# Patient Record
Sex: Female | Born: 1975 | Race: White | Hispanic: No | Smoking: Former smoker
Health system: Southern US, Community
[De-identification: ages and names within clinical notes are randomized; demographics above are authoritative.]

## PROBLEM LIST (undated history)

## (undated) DIAGNOSIS — T8859XA Other complications of anesthesia, initial encounter: Secondary | ICD-10-CM

## (undated) DIAGNOSIS — G4700 Insomnia, unspecified: Secondary | ICD-10-CM

## (undated) DIAGNOSIS — F329 Major depressive disorder, single episode, unspecified: Secondary | ICD-10-CM

## (undated) DIAGNOSIS — R112 Nausea with vomiting, unspecified: Secondary | ICD-10-CM

## (undated) DIAGNOSIS — F988 Other specified behavioral and emotional disorders with onset usually occurring in childhood and adolescence: Secondary | ICD-10-CM

## (undated) DIAGNOSIS — F32A Depression, unspecified: Secondary | ICD-10-CM

## (undated) DIAGNOSIS — F419 Anxiety disorder, unspecified: Secondary | ICD-10-CM

## (undated) DIAGNOSIS — R51 Headache: Secondary | ICD-10-CM

## (undated) DIAGNOSIS — R519 Headache, unspecified: Secondary | ICD-10-CM

## (undated) DIAGNOSIS — Z8709 Personal history of other diseases of the respiratory system: Secondary | ICD-10-CM

## (undated) DIAGNOSIS — I10 Essential (primary) hypertension: Secondary | ICD-10-CM

## (undated) DIAGNOSIS — Z8489 Family history of other specified conditions: Secondary | ICD-10-CM

## (undated) DIAGNOSIS — Z9889 Other specified postprocedural states: Secondary | ICD-10-CM

## (undated) DIAGNOSIS — N809 Endometriosis, unspecified: Secondary | ICD-10-CM

## (undated) DIAGNOSIS — Z862 Personal history of diseases of the blood and blood-forming organs and certain disorders involving the immune mechanism: Secondary | ICD-10-CM

## (undated) DIAGNOSIS — M1990 Unspecified osteoarthritis, unspecified site: Secondary | ICD-10-CM

## (undated) DIAGNOSIS — Z87442 Personal history of urinary calculi: Secondary | ICD-10-CM

## (undated) DIAGNOSIS — T4145XA Adverse effect of unspecified anesthetic, initial encounter: Secondary | ICD-10-CM

## (undated) HISTORY — DX: Endometriosis, unspecified: N80.9

## (undated) HISTORY — PX: WRIST SURGERY: SHX841

## (undated) HISTORY — PX: TONSILLECTOMY: SUR1361

## (undated) HISTORY — PX: ULNAR NERVE REPAIR: SHX2594

## (undated) HISTORY — PX: APPENDECTOMY: SHX54

## (undated) HISTORY — PX: LAPAROSCOPIC ENDOMETRIOSIS FULGURATION: SUR769

## (undated) HISTORY — PX: KNEE SURGERY: SHX244

---

## 1997-12-22 ENCOUNTER — Ambulatory Visit (HOSPITAL_COMMUNITY): Admission: RE | Admit: 1997-12-22 | Discharge: 1997-12-22 | Payer: Self-pay

## 1998-08-30 ENCOUNTER — Ambulatory Visit (HOSPITAL_COMMUNITY): Admission: RE | Admit: 1998-08-30 | Discharge: 1998-08-30 | Payer: Self-pay

## 1999-04-24 ENCOUNTER — Emergency Department (HOSPITAL_COMMUNITY): Admission: EM | Admit: 1999-04-24 | Discharge: 1999-04-24 | Payer: Self-pay | Admitting: Emergency Medicine

## 1999-04-24 ENCOUNTER — Encounter: Payer: Self-pay | Admitting: Family Medicine

## 1999-05-09 ENCOUNTER — Ambulatory Visit (HOSPITAL_COMMUNITY): Admission: RE | Admit: 1999-05-09 | Discharge: 1999-05-09 | Payer: Self-pay | Admitting: Family Medicine

## 1999-05-09 ENCOUNTER — Encounter: Payer: Self-pay | Admitting: Family Medicine

## 1999-06-28 ENCOUNTER — Encounter: Payer: Self-pay | Admitting: Family Medicine

## 1999-06-28 ENCOUNTER — Ambulatory Visit (HOSPITAL_COMMUNITY): Admission: RE | Admit: 1999-06-28 | Discharge: 1999-06-28 | Payer: Self-pay | Admitting: Family Medicine

## 1999-06-29 ENCOUNTER — Other Ambulatory Visit: Admission: RE | Admit: 1999-06-29 | Discharge: 1999-06-29 | Payer: Self-pay | Admitting: Obstetrics and Gynecology

## 1999-09-07 ENCOUNTER — Encounter (INDEPENDENT_AMBULATORY_CARE_PROVIDER_SITE_OTHER): Payer: Self-pay | Admitting: Specialist

## 1999-09-07 ENCOUNTER — Ambulatory Visit (HOSPITAL_COMMUNITY): Admission: RE | Admit: 1999-09-07 | Discharge: 1999-09-07 | Payer: Self-pay | Admitting: Gastroenterology

## 2000-07-26 ENCOUNTER — Ambulatory Visit (HOSPITAL_COMMUNITY): Admission: RE | Admit: 2000-07-26 | Discharge: 2000-07-26 | Payer: Self-pay | Admitting: Family Medicine

## 2000-07-26 ENCOUNTER — Encounter: Payer: Self-pay | Admitting: Family Medicine

## 2000-08-17 ENCOUNTER — Other Ambulatory Visit: Admission: RE | Admit: 2000-08-17 | Discharge: 2000-08-17 | Payer: Self-pay | Admitting: Obstetrics & Gynecology

## 2000-09-20 ENCOUNTER — Ambulatory Visit (HOSPITAL_COMMUNITY): Admission: RE | Admit: 2000-09-20 | Discharge: 2000-09-20 | Payer: Self-pay | Admitting: *Deleted

## 2001-10-20 ENCOUNTER — Encounter: Payer: Self-pay | Admitting: Emergency Medicine

## 2001-10-20 ENCOUNTER — Encounter (INDEPENDENT_AMBULATORY_CARE_PROVIDER_SITE_OTHER): Payer: Self-pay

## 2001-10-20 ENCOUNTER — Inpatient Hospital Stay (HOSPITAL_COMMUNITY): Admission: EM | Admit: 2001-10-20 | Discharge: 2001-10-22 | Payer: Self-pay

## 2002-03-06 ENCOUNTER — Encounter: Payer: Self-pay | Admitting: *Deleted

## 2002-03-06 ENCOUNTER — Encounter: Admission: RE | Admit: 2002-03-06 | Discharge: 2002-03-06 | Payer: Self-pay | Admitting: *Deleted

## 2002-03-21 ENCOUNTER — Other Ambulatory Visit: Admission: RE | Admit: 2002-03-21 | Discharge: 2002-03-21 | Payer: Self-pay | Admitting: *Deleted

## 2002-05-16 ENCOUNTER — Ambulatory Visit (HOSPITAL_COMMUNITY): Admission: RE | Admit: 2002-05-16 | Discharge: 2002-05-16 | Payer: Self-pay | Admitting: *Deleted

## 2002-08-08 ENCOUNTER — Ambulatory Visit (HOSPITAL_COMMUNITY): Admission: RE | Admit: 2002-08-08 | Discharge: 2002-08-08 | Payer: Self-pay | Admitting: Family Medicine

## 2002-08-08 ENCOUNTER — Encounter: Payer: Self-pay | Admitting: Family Medicine

## 2003-07-13 ENCOUNTER — Other Ambulatory Visit: Admission: RE | Admit: 2003-07-13 | Discharge: 2003-07-13 | Payer: Self-pay | Admitting: *Deleted

## 2003-10-15 ENCOUNTER — Ambulatory Visit (HOSPITAL_COMMUNITY): Admission: RE | Admit: 2003-10-15 | Discharge: 2003-10-15 | Payer: Self-pay | Admitting: Otolaryngology

## 2003-12-03 ENCOUNTER — Emergency Department (HOSPITAL_COMMUNITY): Admission: EM | Admit: 2003-12-03 | Discharge: 2003-12-03 | Payer: Self-pay | Admitting: Emergency Medicine

## 2003-12-08 ENCOUNTER — Encounter: Admission: RE | Admit: 2003-12-08 | Discharge: 2003-12-08 | Payer: Self-pay | Admitting: *Deleted

## 2003-12-31 ENCOUNTER — Ambulatory Visit (HOSPITAL_COMMUNITY): Admission: RE | Admit: 2003-12-31 | Discharge: 2003-12-31 | Payer: Self-pay | Admitting: *Deleted

## 2004-11-07 ENCOUNTER — Encounter: Admission: RE | Admit: 2004-11-07 | Discharge: 2004-11-07 | Payer: Self-pay | Admitting: Orthopedic Surgery

## 2004-11-10 ENCOUNTER — Encounter: Admission: RE | Admit: 2004-11-10 | Discharge: 2004-11-10 | Payer: Self-pay | Admitting: Orthopedic Surgery

## 2005-01-12 ENCOUNTER — Encounter: Admission: RE | Admit: 2005-01-12 | Discharge: 2005-01-12 | Payer: Self-pay | Admitting: General Surgery

## 2005-01-25 ENCOUNTER — Encounter: Admission: RE | Admit: 2005-01-25 | Discharge: 2005-01-25 | Payer: Self-pay | Admitting: General Surgery

## 2005-06-02 ENCOUNTER — Encounter
Admission: RE | Admit: 2005-06-02 | Discharge: 2005-08-31 | Payer: Self-pay | Admitting: Physical Medicine & Rehabilitation

## 2005-06-02 ENCOUNTER — Ambulatory Visit: Payer: Self-pay | Admitting: Physical Medicine & Rehabilitation

## 2006-03-28 ENCOUNTER — Inpatient Hospital Stay (HOSPITAL_COMMUNITY): Admission: AD | Admit: 2006-03-28 | Discharge: 2006-03-28 | Payer: Self-pay | Admitting: Obstetrics and Gynecology

## 2006-04-01 ENCOUNTER — Inpatient Hospital Stay (HOSPITAL_COMMUNITY): Admission: AD | Admit: 2006-04-01 | Discharge: 2006-04-01 | Payer: Self-pay | Admitting: Obstetrics & Gynecology

## 2006-04-12 ENCOUNTER — Inpatient Hospital Stay (HOSPITAL_COMMUNITY): Admission: AD | Admit: 2006-04-12 | Discharge: 2006-04-15 | Payer: Self-pay | Admitting: *Deleted

## 2006-11-05 ENCOUNTER — Encounter
Admission: RE | Admit: 2006-11-05 | Discharge: 2007-02-03 | Payer: Self-pay | Admitting: Physical Medicine & Rehabilitation

## 2006-11-06 ENCOUNTER — Ambulatory Visit: Payer: Self-pay | Admitting: Physical Medicine & Rehabilitation

## 2006-12-18 ENCOUNTER — Ambulatory Visit: Payer: Self-pay | Admitting: Physical Medicine & Rehabilitation

## 2007-01-22 ENCOUNTER — Ambulatory Visit: Payer: Self-pay | Admitting: Physical Medicine & Rehabilitation

## 2007-02-07 ENCOUNTER — Encounter
Admission: RE | Admit: 2007-02-07 | Discharge: 2007-05-08 | Payer: Self-pay | Admitting: Physical Medicine & Rehabilitation

## 2007-04-08 ENCOUNTER — Ambulatory Visit: Payer: Self-pay | Admitting: Physical Medicine & Rehabilitation

## 2007-05-06 ENCOUNTER — Encounter
Admission: RE | Admit: 2007-05-06 | Discharge: 2007-08-04 | Payer: Self-pay | Admitting: Physical Medicine & Rehabilitation

## 2007-06-06 ENCOUNTER — Encounter
Admission: RE | Admit: 2007-06-06 | Discharge: 2007-08-02 | Payer: Self-pay | Admitting: Physical Medicine & Rehabilitation

## 2007-06-14 ENCOUNTER — Ambulatory Visit: Payer: Self-pay | Admitting: Physical Medicine & Rehabilitation

## 2007-06-21 ENCOUNTER — Encounter
Admission: RE | Admit: 2007-06-21 | Discharge: 2007-06-21 | Payer: Self-pay | Admitting: Physical Medicine & Rehabilitation

## 2007-07-25 ENCOUNTER — Ambulatory Visit: Payer: Self-pay | Admitting: Physical Medicine & Rehabilitation

## 2007-08-21 ENCOUNTER — Encounter
Admission: RE | Admit: 2007-08-21 | Discharge: 2007-11-19 | Payer: Self-pay | Admitting: Physical Medicine & Rehabilitation

## 2007-09-06 ENCOUNTER — Ambulatory Visit: Payer: Self-pay | Admitting: Physical Medicine & Rehabilitation

## 2007-10-15 ENCOUNTER — Ambulatory Visit: Payer: Self-pay | Admitting: Physical Medicine & Rehabilitation

## 2007-10-20 ENCOUNTER — Ambulatory Visit (HOSPITAL_COMMUNITY)
Admission: RE | Admit: 2007-10-20 | Discharge: 2007-10-20 | Payer: Self-pay | Admitting: Physical Medicine & Rehabilitation

## 2007-10-31 ENCOUNTER — Encounter
Admission: RE | Admit: 2007-10-31 | Discharge: 2008-01-29 | Payer: Self-pay | Admitting: Physical Medicine & Rehabilitation

## 2007-12-06 ENCOUNTER — Ambulatory Visit: Payer: Self-pay | Admitting: Physical Medicine & Rehabilitation

## 2008-01-06 ENCOUNTER — Ambulatory Visit: Payer: Self-pay | Admitting: Physical Medicine & Rehabilitation

## 2008-01-06 ENCOUNTER — Encounter
Admission: RE | Admit: 2008-01-06 | Discharge: 2008-01-07 | Payer: Self-pay | Admitting: Physical Medicine & Rehabilitation

## 2008-02-24 ENCOUNTER — Ambulatory Visit: Payer: Self-pay | Admitting: Physical Medicine & Rehabilitation

## 2008-02-24 ENCOUNTER — Encounter
Admission: RE | Admit: 2008-02-24 | Discharge: 2008-05-24 | Payer: Self-pay | Admitting: Physical Medicine & Rehabilitation

## 2008-02-28 ENCOUNTER — Ambulatory Visit: Payer: Self-pay | Admitting: Physical Medicine & Rehabilitation

## 2008-03-09 ENCOUNTER — Ambulatory Visit: Payer: Self-pay | Admitting: Physical Medicine & Rehabilitation

## 2008-04-07 ENCOUNTER — Ambulatory Visit: Payer: Self-pay | Admitting: Physical Medicine & Rehabilitation

## 2008-05-07 ENCOUNTER — Ambulatory Visit: Payer: Self-pay | Admitting: Physical Medicine & Rehabilitation

## 2008-06-03 ENCOUNTER — Encounter
Admission: RE | Admit: 2008-06-03 | Discharge: 2008-06-26 | Payer: Self-pay | Admitting: Physical Medicine & Rehabilitation

## 2008-06-05 ENCOUNTER — Ambulatory Visit: Payer: Self-pay | Admitting: Physical Medicine & Rehabilitation

## 2008-06-25 ENCOUNTER — Ambulatory Visit (HOSPITAL_COMMUNITY)
Admission: RE | Admit: 2008-06-25 | Discharge: 2008-06-25 | Payer: Self-pay | Admitting: Physical Medicine & Rehabilitation

## 2008-06-26 ENCOUNTER — Ambulatory Visit: Payer: Self-pay | Admitting: Physical Medicine & Rehabilitation

## 2008-07-10 ENCOUNTER — Ambulatory Visit: Payer: Self-pay | Admitting: Physical Medicine & Rehabilitation

## 2009-01-31 ENCOUNTER — Encounter: Admission: RE | Admit: 2009-01-31 | Discharge: 2009-01-31 | Payer: Self-pay | Admitting: *Deleted

## 2009-04-27 ENCOUNTER — Encounter: Admission: RE | Admit: 2009-04-27 | Discharge: 2009-04-27 | Payer: Self-pay | Admitting: Neurological Surgery

## 2009-12-29 ENCOUNTER — Encounter: Admission: RE | Admit: 2009-12-29 | Discharge: 2009-12-29 | Payer: Self-pay | Admitting: Obstetrics

## 2010-06-21 ENCOUNTER — Encounter: Admission: RE | Admit: 2010-06-21 | Discharge: 2010-06-21 | Payer: Self-pay | Admitting: Otolaryngology

## 2011-02-27 ENCOUNTER — Other Ambulatory Visit: Payer: Self-pay | Admitting: Obstetrics

## 2011-03-07 NOTE — Procedures (Signed)
NAMEINGRI, DIEMER              ACCOUNT NO.:  [id number]   MEDICAL RECORD NO.:  [id number]          PATIENT TYPE:  REC   LOCATION:  [facility]                          FACILITY:  [facility]   PHYSICIAN:  Erick Colace, M.D.DATE OF BIRTH:  1976/09/29   DATE OF PROCEDURE:  12/12/2007  DATE OF DISCHARGE:                               OPERATIVE REPORT   PROCEDURE:  Right sacroiliac injection under fluoroscopic guidance.   INDICATION:  Right sacroiliac pain.   Informed consent was obtained after describing the risks and benefits of  the procedure to the patient.  These include bleeding, bruising,  infection, loss of bowel or bladder function, temporary or permanent  paralysis.  She has elected to proceed and has given consent.  She has  had previous 2-1/2-week relief from prior SI joint injection.  Pain has  recurred and is interfering with daily activities.   The patient placed prone on fluoroscopy table.  Betadine prep, sterile  drape.  A 25-gauge inch and a half needle was used to anesthetize skin  and subcu tissue, 1% lidocaine x2 mL.  Then a 25-gauge 3-inch spinal  needle was inserted in the right SI joint, AP, lateral and oblique  imaging utilized.  Omnipaque 180 x 0.5 mL demonstrated good joint  outline, followed by injection of 1 mL of 2% MPF lidocaine and 0.5 mL of  40 mL/mL Depo-Medrol.  The patient tolerated the procedure well.  Post  injection instructions given.  She is going out of town on a business  trip and states increased pain and the injection will help her get  through that.    When I see her back, we will consider SI joint radiofrequency if pain  recurs at more severe levels.      Erick Colace, M.D.  Electronically Signed     AEK/MEDQ  D:  12/12/2007 15:06:36  T:  12/13/2007 14:34:04  Job:  [id number]

## 2011-03-07 NOTE — Assessment & Plan Note (Signed)
DATE 05/07/07 The patient's last visit is April 08, 2007, at which time we  did a left L5 dorsal ramus injection of radiofrequency neurotomy, left  L4 medial branch block radiofrequency neurotomy, left L3 medial branch  block radiofrequency neurotomy.  She has done well post procedure,  generally speaking.  She had some exacerbation of pain about 2 days ago.  She thinks she was carrying her child more than usual since her child  has had some irritability related to recent vaccination.  Her pain is in  the low back area, no radiation down the legs.  No bowel or bladder  dysfunction.   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  She does a lot of driving.   The blood pressure is 123/76, pulse 107, respiratory rate 16, O2 sat 98%  on room air.  GENERAL:  No acute distress.  Mood and affect appropriate.  BACK:  Has some tenderness to palpation around L4 and L3 areas.  The  pain is mainly with lateral bending, more so than with extension or with  flexion.  She has normal strength in lower extremities, normal gait.   IMPRESSION:  1. History of lumbar facet syndrome with some exacerbation of low back      pain, I believe this is due to muscle strain.  Will give her      samples of Amrix 15 mg q.h.s., advised her on caution with hangover      effect in the morning.  2. I will see her back in about 2 weeks should she not have any      improvement, at which time we would likely send her over to      physical therapy.   Otherwise, will continue her current medications which include  hydrocodone 7.5/500 one p.o. b.i.d. or daily as needed, Motrin 600  t.i.d. and Ultram ER 200 mg daily.  May consider reducing hydrocodone  dosage to 5/500 next visit if she is doing better.      Erick Colace, M.D.  Electronically Signed     AEK/MedQ  D:  05/07/2007 10:31:34  T:  05/07/2007 16:43:11  Job #:  [id number]

## 2011-03-07 NOTE — Assessment & Plan Note (Signed)
The patient called to request an appointment today because of increased  pain.  Her pain, per her report, started yesterday.  She had an  orthopedic appointment.  She reports having had fluid drawn off her  knees, left and right, and bilateral knee corticosteroid injections.  She states that she has had increased pain in her knees and back since  that time.  Her average pain is 8/10.  Her pain with activity is 10/10.  Her pain is sharp, burning, and stabbing.  Her sleep is poor.  Pain is  worse with walking and bending.  She is driving.  She is working full  time as a Tax adviser.   Her review of systems is positive for trouble walking.   SOCIAL HISTORY:  She is married.   She rates her pain as severe at the moment.  Sitting tolerance 30  minutes, standing tolerance 30 minutes, walking tolerance one-half mile.  Her Oswestry disability index 48%.  Her last Oswestry score is 26.   Last visit on June 26, 2008.   PHYSICAL EXAMINATION:  GENERAL:  Look appears tired; otherwise, no acute  distress.  Mood and affect mildly anxious.  EXTREMITIES:  Without edema.  She has no evidence of effusion in her  knees, but she does have needle injection site apparent.  Her gait is  normal.  She has tenderness in her gluteus medius muscle, right greater  than left.  She has Flector patches bilateral knees and over the right  buttocks area.  Lower extremity strength is normal.  Lower extremity  range of motion is normal.   IMPRESSION:  Knee pain, question patellofemoral versus meniscal  problems.  I believe that the pain from this is magnified due to the  fact she has been on chronic narcotic analgesics for her chronic  sacroiliac pain.   I discussed that this is a problem with being on a long-term narcotic  analgesics where by acute pain issues are more difficult to deal with.  Opioid rotation may be helpful, but this is not guaranteed.  Also, she  is on long-term anti-inflammatories and once  again really no where to go  in terms of having acute flareup from the anti-inflammatory standpoint  other than rotating.   We discussed long term plan of reducing narcotic analgesics to at most  b.i.d. dosing and eliminating long-term anti-inflammatory.  We do have  an acute episode to deal with, however, and for that, we will write  oxycodone 10/325 q.i.d. to be used for 2 weeks until I see her in the  office again.   Because of the change of medication, I have asked her for a urine drug  screen.  She states that she had a Percocet from her husband that she  took yesterday.  As I discussed with the patient that is a violation of  controlled substance agreement.  The patient understands.   ADDENDUM   Toradol IM injected today left hip, lot number [id number] x2, vials 30 mg  each with a 30 mg per 1 mL dilution.  The patient tolerated the  procedure well.     Erick Colace, M.D.  Electronically Signed    AEK/MedQ  D:  07/10/2008 10:45:14  T:  07/11/2008 03:43:46  Job #:  [id number]

## 2011-03-07 NOTE — Assessment & Plan Note (Signed)
Ms. Salay returns today and I last saw her in August 2008.  She has a  history of lumbosacral pain.  She has sacroiliac arthropathy.  She has  had good temporary relief with sacroiliac injections and only partial  relief with sacroiliac radiofrequency and has been evaluated for  prolotherapy of the sacroiliac joint.  She is held off on that because  her husband was on medical leave from work, but he is trying to work  again, and she is planning to follow up with Dr. Para March over the  [facility] in regards to this.   More recently, she has had difficulty with right knee pain.  She has had  history of ACL reconstruction in 1995 and had been doing relatively well  in this regard up until last several months where she has had increasing  pain, occasional swelling, and inability to squat down.   Her Oswestry disability index today is 26 putting her in the mild range.  Follow up of imaging studies performed of her right knee to further  evaluate her complaints showed no evidence of joint space narrowing.  Screws were present in the distal femur and proximal tibia.  Small joint  effusion was noted and no intraarticular calcification is noted.   MEDICATIONS:  1. Hydrocodone 10/325 t.i.d.  2. Ultram ER 200 mg daily.  3. Flector Patch to back q.12 h.  4. Celebrex 200 b.i.d.   PHYSICAL EXAMINATION:  GENERAL:  No acute distress. Mood and affect  appropriate.  EXTREMITIES:  Her knee has no evidence of effusion and no fluid in the  suprapatellar bursa.  She has no fullness in the popliteal area.  Good  popliteal pulse.  No tenderness over the medial or lateral hamstring  tendons.  She has some lateral joint line tenderness.  Her patella has a  negative apprehension sign.  She has pain with flexion at around 90  degrees.  She has a positive Apley grind test on the right side.   Her back has tenderness over the PSIS.  She has negative straight leg  raise.  Normal strength in lower  extremities.  Normal deep tendon reflex  and normal sensation.   IMPRESSION:  1. Right sacroiliac disorder, chronic.  Continue current medications.  2. Right knee pain, suspect the meniscal tear or meniscal      degeneration.  I will send her to Dr. Hayden Rasmussen from      [facility] to further evaluate to see if he thinks she      may need an MRI and possibly arthroscopy.  3. I will see her back in approximately 1 month.      Erick Colace, M.D.  Electronically Signed     AEK/MedQ  D:  06/26/2008 17:59:46  T:  06/27/2008 07:00:50  Job #:  [id number]   cc:   Erasmo Leventhal, M.D.  Fax: [phone]   Thurnell Garbe  [phone]   Dr. Archer Asa  [facility]

## 2011-03-07 NOTE — Procedures (Signed)
Danielle Harrington, Danielle Harrington              ACCOUNT NO.:  [id number]   MEDICAL RECORD NO.:  [id number]          PATIENT TYPE:  REC   LOCATION:  [facility]                         FACILITY:  [facility]   PHYSICIAN:  Erick Colace, M.D.DATE OF BIRTH:  03-21-1976   DATE OF PROCEDURE:  07/01/2007  DATE OF DISCHARGE:                               OPERATIVE REPORT   Right S1 transforaminal epidural dural steroid injection under  fluoroscopic guidance.   INDICATION:  Right S1 radiculitis only partially responsive to  medication management.   The patient placed prone on fluoroscopy table.  Betadine prep, sterile  drape 25-gauge 1-1/2 inch needle was used to anesthetize skin and subcu  tissue 1% lidocaine x2 mL. Then a 22-gauge 3-1/2 inch spinal needle  inserted in the right S1 foramen. AP and lateral imaging utilized.  Omnipaque 180 x 0.5 mL demonstrated no intravascular uptake, then a  solution containing 1 mL of 40 mg/mL Depo-Medrol plus 2 mL of 1% MPF  lidocaine was injected.  The patient tolerated procedure well.  Pre  injection pain level 8/10.  Post injection 4/10.  Post injection  instructions given.  Return in 1 month for repeat injection.  If she is  doing quite well at this time in 1 month we can hold off on this.  She  will continue physical therapy until the time which she comes back.  Will continue Neurontin, Celebrex, hydrocodone.      Erick Colace, M.D.  Electronically Signed     AEK/MEDQ  D:  07/01/2007 16:29:02  T:  07/02/2007 10:28:49  Job:  [id number]

## 2011-03-07 NOTE — Assessment & Plan Note (Signed)
A 35 year old female with history of lumbar facet syndrome due to  radicular right lower extremity symptoms, good relief with sacroiliac  injection.  She has had radiofrequency sacroiliac, which was not  particularly helpful indicating intervention was likely provided through  ventral rather than dorsal rami.  She saw Dr. Para March for prolotherapy  and felt to be a good candidate.  The patient is holding off for couple  months on this; however, because of her husband needing to take off time  for medical need.  The patient's sleep is poor.   INTERVAL MEDICAL HISTORY:  Has had some right flank pain, is scheduled  to see urology, she has had a couple of UTIs recently treated with  antibiotics, 1 episode with some hemorrhagic cystitis.   PHYSICAL EXAMINATION:  GENERAL:  In no acute distress.  Mood and affect  appropriate.  VITAL SIGNS:  Her blood pressure is 124/84, pulse 84, and respirations  22.  BACK:  She has some mild right costovertebral angle tenderness.  She has  lesser pain in the PSIS region on the right side.  EXTREMITIES:  She has full strength.  Normal sensation and normal deep  tendon reflexes in bilateral lower extremity.  Gait is normal.  Extremities without edema.   IMPRESSION:  1. Sacroiliac pain, right-sided.  2. Probable ureteral colic.  3. Status post 2 recent urinary tract infections, afebrile at this      time.   PLAN:  Continue current medications, which include,  1. Oxycodone 10/325 q.i.d.  2. Ultram ER 1 p.o. daily.  3. Celebrex 200 mg 1 p.o. daily.  4. We will trial some Voltaren gel to the low back area q.i.d.  5. I will see her back in 1 month.  6. Consider re-imagings if no improvement.      Erick Colace, M.D.  Electronically Signed     AEK/MedQ  D:  05/07/2008 16:38:22  T:  05/08/2008 07:17:26  Job #:  [id number]   cc:   Santina Evans at [facility] in [place]

## 2011-03-07 NOTE — Assessment & Plan Note (Signed)
INTERVAL HISTORY:  She has had onset of right lower extremity pain  starting on Friday, i.e., on May 17, 2007, without history of trauma.  Sitting and lying down make it hurt more.  Standing helps it.  Walking  does help.  Her pain level is 6/10 on average but up to 9 currently.  She has some numbness in her right little toe.  She denies any bowel or  bladder problems.   She has tried some Motrin 600 t.i.d.  She has been on Ultram ER 200 mg  daily as well as her usual hydrocodone and Flexeril.  She has been using  heat, no Tylenol.   Her blood pressure is 136/82, pulse 108, respiratory rate 19, 02  saturations 96% on room air.  GENERAL:  No acute distress.  Mood and affect appropriate.  Her back has  no tenderness to palpation, but she has pain with forward flexion  causing some pain going down the right leg.  Extension does hurt but  does not cause any pain down the leg.  She has no loss of sensation in  the L3-4, 5, S1 dermatomes.  She has normal strength in these same  dermatomes, and gait is antalgic favoring the right leg but otherwise no  obvious toe drag or knee instability.   IMPRESSION:  Sciatica, rather acute onset.  Given that she has no relief  with the Motrin 600 t.i.d., we will stop this and try her on a Medrol  dose pack.  We will continue her other current medications.  Also asked  her to stay home from work for 2 days but to remain active up around the  house.   I will see her back in 2 weeks and if not doing much better, we will  send her to physical therapy and check x-ray.   I discussed with the patient; she is in agreement with the plan.      Erick Colace, M.D.  Electronically Signed     AEK/MedQ  D:  05/20/2007 16:19:10  T:  05/21/2007 11:53:03  Job #:  [id number]

## 2011-03-07 NOTE — Assessment & Plan Note (Signed)
Danielle Harrington returns today in the interval time.  She has passed 2 kidney  stones per her report.   PRIMARY CARE Danielle Harrington:  Dr. Archer Asa over at [facility].   She is not gone through prolotherapy yet, but plans to do next month  after her husband gets back to work after surgery that he has undergone.   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  This involves quite a bit of travel.  She  is independent with all self-care mobility.  In fact, she is doing some  exercise to help with some chronic knee problems, knee pain, and quad  weakness.  She thinks she lost a lot of muscle mass after pregnancy.  The pain is described as sharp, stabbing, constant, aching, and centered  over the right PSIS, and extending into the right posterior thigh.  She  also has right knee pain, which is mainly lateral and anterior rather  than posterior.   REVIEW SYSTEMS:  Positive for painful urination, is seeing Dr.  Archer Asa in [facility].   Her last hydrocortisone prescription was dated 05/07/2008, she has 3  left.  She is needing refills on Celebrex, Lidoderm, and Ultram.   PHYSICAL EXAMINATION:  GENERAL:  Right knee has no evidence of effusion.  She has no signs of medial or lateral instability.  Negative Lachman's.  Negative  Apley grind test, although this does give her some  infrapatellar pain, not clearly positive.   She has no popliteal tenderness.  She has normal strength in hip  flexion, knee extension, and ankle dorsiflexion.  She able to toe walk  and heel walk.  The back has tenderness over the right PSIS.  She has  full forward flexion and pain with hyperextension.   IMPRESSION:  Right sacroiliac disorder, chronic.  She has had some  improvement with conventional radiofrequency in the sacral area.  In  addition, she has some right knee pain, which may be  intrarticular, has  history of anterior cruciate ligament repair as well as meniscal repair  about 15 years ago.  No signs of locking or anything acute.   PLAN:  1. We will continue her with current medications, i.e., hydrocortisone      10/325 one p.o. q.i.d.  2. Flexor patch to the buttocks b.i.d.  3. Ultram ER 200 mg p.o. daily.  4. Also Lidoderm that she can alternate with Flexor over the buttock      area.  I will see her back in 1 month.  She will follow up with      Neurology in regards to her history of kidney stones.  I would like      to get records from Dr. Archer Asa in regards to this to document      with our records as well.      Erick Colace, M.D.  Electronically Signed    AEK/MedQ  D:  06/05/2008 15:15:11  T:  06/06/2008 05:36:03  Job #:  [id number]   cc:   Dr. Archer Asa

## 2011-03-07 NOTE — Assessment & Plan Note (Signed)
Danielle Harrington returns today.  She had medial branch blocks done October 21, 2007.  She had pain relief from 9/10 pain to 5/10 post injection.  However, she has had some aggravation of pain since that time.  Has  difficulty holding her son up.  Her pain is mainly centered in the right  buttock area.   CURRENT MEDICATIONS:  1. Tramadol 2 p.o. b.i.d.  2. Celebrex 200 b.i.d.  3. Skelaxin 800 nightly.  4. Hydrocodone 10/325 t.i.d.   INTERVAL MEDICAL HISTORY:  Planning to see a urologist for hematuria.  She has renal ultrasound that was done recently that was normal.  Her  average pain is 8/10 to 9/10.  Her sleep is poor.  She continues to work  40 hours a week for [employer].   EXAMINATION:  Blood pressure 131/69, pulse 92, O2 saturation 97% on room  air.  GENERAL:  In no acute distress.  Mood and affect appropriate.  BACK:  No tenderness to palpation.  However, her PSIS area is tender and  the gluteus medius area is tender.  She has positive fabere's testing in  the PSIS area on the right side only.   Negative straight leg raise test.  Normal deep tendon reflexes, and  normal lower extremity range of motion as well as normal strength.  Gait  is normal.   Extremities without edema.  Her lumbar range of motion has about 75%  forward flexion and 25-50% extension.  Extension is causing more pain  than forward flexion.   Neck range of motion is good.   IMPRESSION:  1. Right buttock pain with exam findings consistent with sacroiliac      disorder as well as myofascial pain.  She communicates that she was      once told she had a bout of inflammatory bowel disease, but has not      had any significant symptoms recently.  There is association with      sacroiliitis and inflammatory bowel disease.  Will go ahead and      schedule for sacroiliac injection.  2. Will do trigger point of the gluteus medius today.  I think this is      an associated problem or maybe the primary pain generator in  and of      itself.  3. Continue current medications, except change tramadol to t.i.d.  I      will see her back for the injection.      Erick Colace, M.D.  Electronically Signed     AEK/MedQ  D:  10/31/2007 14:06:37  T:  10/31/2007 14:38:06  Job #:  [id number]

## 2011-03-07 NOTE — Assessment & Plan Note (Signed)
Danielle Harrington followed up today.  I last saw her May 20, 2007.  She has a  prior history of lumbar facet syndrome but for the last 2 weeks she has  developed right lower extremity radicular type pain involving the  lateral border of her right foot.  She has had no loss of bowel and  bladder function, she has had no weakness, she does have some  paresthesias on the lateral border of her foot, no trauma history.   She was started on Amrex 15 q.h.s. which has helped somewhat.  She had a  Medrol Dose-pack which did help relieve the severe pain.  She is now off  this and now taking over-the-counter ibuprofen approximately 600 t.i.d.  She continues on her Ultram ER 200 mg per day and she has been increased  on her hydrocodone from a b.i.d. dosing to a q.i.d. at the 7.5/500 dose.   Her pain last time was 9 or 10/10, currently is in the 5-7 range.  Sleep  is, however, poor and her pain during the day is mainly in her right  lower extremity whereas in the evening mostly in the back.   EXAMINATION:  GENERAL:  No acute distress, mood and affect appropriate.  BACK:  Has mild tenderness over the PSIS, negative straight leg raising,  normal strength in the lower extremities, normal sensation to pinprick  but slightly diminished to light touch in the lateral border of the  right foot compared to the left side.  Able to toe walk, heel walk.  Deep tendon reflexes are normal, range of motion is normal in the lower  extremities.   IMPRESSION:  Probable lumbar radiculitis.  Seems moderately improved,  but given persistence and some mechanical factors that alleviate and  exacerbate the pain she would benefit from physical therapy and have  made a referral today.  I will see her back in two weeks followup on  this.  If still having significant radicular discomfort, I would order  MRI and then consider an epidural at that point.      Danielle Harrington, M.D.  Electronically Signed     AEK/MedQ  D:   05/30/2007 17:16:05  T:  05/31/2007 10:03:30  Job #:  [id number]

## 2011-03-07 NOTE — Assessment & Plan Note (Signed)
Danielle Harrington returns today.  She was last seen by me on December 13, 2007.  She had right sacroiliac injections.  She has had an excellent 2-1/2  weeks after this injection, and this mimicked the 2-1/2 week duration of  the last injection prior to on November 12, 2007 which was a right  sacroiliac injection.  Her pain is back to 8 to 9 out of 10 level.  It  is compounded by some abdominal cramping today which she associates with  her menstrual period.  Her sleep is poor.  This has been multifactorial,  really, going back to 2003, a combination of pain as well as just  frequent nighttime awakening.   She has had no new medical problems in the interval time.   CURRENT MEDICATIONS:  1. Celebrex 200 mg p.o. b.i.d.  2. Ultram ER 200 mg per day.  3. Hydrocodone 7.5/325 one p.o. t.i.d. #81.   PHYSICAL EXAMINATION:  Blood pressure 139/95, pulse 104, respirations  18, O2 sat 99% on room air.  She is forward flexed due to some abdominal cramping, but she is able to  straighten out.  She can ambulate without evidence of toe drag or knee  instability.  Her lumbar range of motion is 100% forward flexion and 50%  extension.  Extension does cause some pain in the right buttock,  particularly from a flexed position to an extended position.  Lower  extremity strength is normal in the hip flexor, knee extensor, ankle  dorsiflexor.  Hip internal/external rotation, knee and ankle range of  motion are normal.  She has tenderness over the right PSIS area.   IMPRESSION:  Right sacroiliac disorder, temporary relief with sacroiliac  injection x2.   We discussed treatment options including medication management, but also  discussed radiofrequency neurotomy as a viable treatment alternative  with goals of reducing medication usage.  I went over the risks and  benefits, and she would like to schedule this in the next couple of  weeks.  We will do that.  We will continue her current medications.  Goal after the  procedure would be to reduce Celebrex and/or hydrocodone.      Erick Colace, M.D.  Electronically Signed     AEK/MedQ  D:  01/07/2008 09:35:41  T:  01/07/2008 10:21:56  Job #:  [id number]

## 2011-03-07 NOTE — Assessment & Plan Note (Signed)
Danielle Harrington is a 35 year old female who has prior history of lumbar facet  syndrome and pseudoradicular right lower extremity symptoms.  She has  had good relief with sacroiliac injections, but these have been short  lived.  She has had an MRI last performed approximately on June 21, 2007, showing normal disks T11 through S1 and only mild multilevel facet  hypertrophy.  She did have increased STIR  signal of the left paraspinal  muscle at L5 and S1 levels, but this was consistent with the recent  lumbar radiofrequency neurotomy causing some atrophy of the multifidi  muscles.   MEDICATIONS:  She has had S1 epidural withdrawal which were also not  particularly effective.  She has had EMG/NCV  which was really  essentially a normal study in the right lower extremity.  She has been  on and off Vicodin more on than off over the last year and a half.  She  had been tried on Skelaxin and Neurontin and has more recently been  alternating Lidoderm patch with Flector patch.  She also has a baseline  med of Ultram ER 200 mg per day.   Her average pain is rate at 8/10.  Sleep is poor.  Pain is worse with  prolonged sitting.   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   REVIEW OF SYSTEMS:  Positive for respiratory difficulty.  She has had  recent URI, has been treated with antibiotics as well as corticosteroid  inhaler.   PHYSICAL EXAMINATION:  VITAL SIGNS:  Blood pressure is 152/92, pulse is  86, respiratory rate 18, O2 sat 98% on room air.  GENERAL:  Well developed, well nourished female in no acute distress.  Orientation x3.  Affect is bright.  Alert.  Gait is normal.  She has some tenderness over the right PSIS area.  She has a positive  FABER on that side.  She has no pain with hip internal and external  rotation and has normal range of  motion of bilateral lower extremities.  Her deep tendon reflex are normal in bilateral extremities.  Her  strength is normal in hip flexion, knee extension, and ankle  dorsiflexion.  She has no evidence of peripheral edema in her lower  extremities with normal pulses.   IMPRESSION:  Sacroiliac pain.  She has had sacroiliac radiofrequency  ablation approximately 6 weeks ago.  No significant improvement.  I did  indicate that it could be that her innervation to the sacroiliac is  provided through the ventral rami than dorsal.   For now, we will continue her hydrocodone 10/325 q.i.d. in addition to  the Ultram ER 200 mg per day and Celebrex 200 mg b.i.d.  She will be  alternating Lidoderm and Flector patches as well.  All these things help  her to a mild degree.   She is looking for other treatment options.  We had already tried  acupuncture, this was not particularly helpful.  I did indicate that  prolotherapy might be something worth while to explore and I have given  her the name of Thurnell Garbe at [facility] to look into this bit further.  I have  indicated the patient that there may not be insurance coverage for this  therapy, but she is still willing to check into this.  Erick Colace, M.D.  Electronically Signed     AEK/MedQ  D:  04/07/2008 17:42:25  T:  04/08/2008 12:43:23  Job #:  [id number]   cc:   Santina Evans A. Para March, M.D.  [employer]

## 2011-03-07 NOTE — Procedures (Signed)
NAMETERRELL, SHIMKO              ACCOUNT NO.:  [id number]   MEDICAL RECORD NO.:  [id number]          PATIENT TYPE:  REC   LOCATION:  [facility]                          FACILITY:  [facility]   PHYSICIAN:  Erick Colace, M.D.DATE OF BIRTH:  October 17, 1976   DATE OF PROCEDURE:  08/22/2007  DATE OF DISCHARGE:                               OPERATIVE REPORT   This is a right S1 transforaminal lumbar epidural steroid injection  under fluoroscopic guidance.   INDICATIONS:  Right S1 radiculitis responding to previous S1  transforaminal injection, duration approximately 3-4 weeks.  Has MRI  showing no compressive lesions but with distinct right S1 radicular  signs.   Informed consent was obtained after describing risks and benefits of the  procedure to the patient.  These include bleeding, bruising, infection,  loss of bowel and bladder function, temporary or permanent paralysis.  She elects to proceed and has given consent.  The patient placed prone  on fluoroscopy table.  Betadine prep, sterile drape.  A 25-gauge 1-1/2-  inch needle was used to anesthetize skin and subcutaneous tissue with 1%  lidocaine x2 mL.  Then, a 25-gauge 1-1/2-inch needle was used to  anesthetize skin and subcutaneous tissue with 1% lidocaine x2 mL.  Then,  a 22-gauge 3-1/2-inch spinal needle was inserted under fluoroscopic  guidance into the right S1 foramen.  AP and lateral imaging utilized.  Omnipaque 180 x 0.5 mL demonstrated no intravascular uptake and good  nerve root spread.  Then, 1 mL of 40 mg/mL Depo-Medrol and 1 mL of 1%  lidocaine were injected.  The patient tolerated the procedure well.  Pre/post injection vitals stable.  Return in 3-4 weeks.  If no better,  recheck EMG to see if she may have something like piriformis syndrome or  other nonradicular neuropathy.  Post-injection instructions given.      Erick Colace, M.D.  Electronically Signed     AEK/MEDQ  D:  08/22/2007 15:25:14  T:  08/23/2007  10:14:51  Job:  [id number]

## 2011-03-07 NOTE — Procedures (Signed)
Danielle Harrington, Danielle Harrington              ACCOUNT NO.:  [id number]   MEDICAL RECORD NO.:  [id number]          PATIENT TYPE:  REC   LOCATION:  [facility]                          FACILITY:  [facility]   PHYSICIAN:  Erick Colace, M.D.DATE OF BIRTH:  1976-10-07   DATE OF PROCEDURE:  07/25/2007  DATE OF DISCHARGE:                               OPERATIVE REPORT   PROCEDURE:  Right S1 transforaminal epidural steroid injection under  fluoroscopic guidance.   INDICATIONS:  Right S1 radiculitis which responded to previous left S1  transforaminal injection approximately one month ago.   Informed consent was obtained after describing risks and benefits to the  patient.  These include bleeding, bruising, infection, loss of bowel and  bladder function, temporary or permanent paralysis.  She elects to  proceed and has given written consent.  The patient placed prone on  fluoroscopy table.  Betadine prep, sterile drape, a 25-gauge needle used  to anesthetize the skin and subcu tissue, 1% lidocaine x2 mL and a 22-  gauge 3-1/2 inch spinal needle was inserted under fluoroscopic guidance  right S1 foramen, AP lateral imaging utilized.  Omnipaque 180 x 0.5 mL  demonstrated no intravascular uptake, then 1 mL of 40 mg/mL  Depo-Medrol  plus 2 mL of 1% MPF lidocaine were injected.  The patient tolerated the  procedure well.  Pre and post injection vitals stable.      Erick Colace, M.D.  Electronically Signed     AEK/MEDQ  D:  07/25/2007 15:17:59  T:  07/26/2007 10:06:32  Job:  [id number]

## 2011-03-07 NOTE — Procedures (Signed)
NAMEKIMMI, Danielle Harrington              ACCOUNT NO.:  [id number]   MEDICAL RECORD NO.:  [id number]          PATIENT TYPE:  OUT   LOCATION:  XRAY                         FACILITY:  [facility]   PHYSICIAN:  Erick Colace, M.D.DATE OF BIRTH:  April 24, 1976   DATE OF PROCEDURE:  10/21/2007  DATE OF DISCHARGE:                               OPERATIVE REPORT   PROCEDURE:  This is a right L5 dorsal ramus injection, right L4 medial  branch block, and right L3 medial branch block under fluoroscopic  guidance.   INDICATIONS:  Lumbar facet-mediated pain.  Pain is only partially  responsive to medication management of this conservative care, including  physical therapy and narcotic analgesics.  Pain interferes with activity  level.   Informed consent was obtained after describing the risks and benefits of  the procedure to the patient.  These include bleeding, bruising,  infection, loss of bowel or bladder function, temporary or permanent  paralysis.  She elects to proceed and has given written consent.   The patient was placed prone on the fluoroscopy table.  Betadine prep,  sterile drape and a 25-gauge 1.5 inch needle was used to anesthetize the  skin and subcutaneous tissue with 1% lidocaine x2 mL.  Then a  22-gauge  3.5 inch spinal needle was inserted under fluoroscopic guidance,  targeting the right S1-SAP sacral ala junction.  Bone contact made,  confirmed with lateral imaging.  Omnipaque 180 x 0.5 mL demonstrated no  intravascular uptake; then 0.5 mL of the solution containing 1 mL of 4  mL dexamethasone and 2 mL of 2% MPF lidocaine was injected in the right  L5-SAP transverse junction.  Targeted bone contact made and confirmed  with lateral imaging.  Omnipaque 180 x 7.5 mL demonstrated no  intravascular uptake; then 0.75 mL of dexamethasone lidocaine solution  injected.  Then the left L4-SAP transverse process junction targeted;  bone contact made and confirmed with lateral imaging.   Omnipaque 180 x  0.5 mL demonstrated no intravascular uptake and 0.75 mL of dexamethasone  lidocaine solution was injected.  The patient tolerated procedure well.  Pre and post injection vitals stable.  Pre injection pain level 9 out of  10; post injection 5 out of 10.  ,   Post injection instructions were given.  She is to return in 3 weeks for  repeat injection; or if no significant effect, consider SI injection.      Erick Colace, M.D.  Electronically Signed     AEK/MEDQ  D:  10/21/2007 15:53:51  T:  10/21/2007 20:52:41  Job:  [id number]

## 2011-03-07 NOTE — Procedures (Signed)
[name], Danielle Harrington              ACCOUNT NO.:  [id number]   MEDICAL RECORD NO.:  [id number]          PATIENT TYPE:  REC   LOCATION:  [facility]                          FACILITY:  [facility]   PHYSICIAN:  Erick Colace, M.D.DATE OF BIRTH:  1976-10-02   DATE OF PROCEDURE:  DATE OF DISCHARGE:                               OPERATIVE REPORT   PROCEDURE:  Right sacroiliac injection under fluoroscopic guidance.   INDICATIONS:  Right sacroiliac pain. Pain is only partially relieved by  medication management other conservative care.   PROCEDURE IN DETAIL:  Informed consent was obtained after describing  risks and benefits of the procedure to the patient.  These include  bleeding, bruising, infection, loss of bladder function, temporary  permanent paralysis. She would like to proceed and was given written  consent. The patient placed in prone on fluoroscopy table in a  Betadine  prep, sterile drape.  A 5-gauge 1-1/2 needle was used to incise skin and  subcu tissue with 1% lidocaine x2 cc's then a 25-gauge 3-inch spinal  needle was inserted under fluoroscopic guidance targeting the sacroiliac  joint, AP lateral oblique imaging utilized.  Omnipaque 180 x 0.5 cc  demonstrated no intravascular uptake, then solution containing 1/2 cc of  40 mL Depo-Medrol 1 cc of 2% MPF lidocaine injected.  The patient  tolerated procedure well.   Pre injection pain level 08/09, post injection 5.   FOLLOW UP:  She will be back in 3 weeks for possible reinjection.      Erick Colace, M.D.  Electronically Signed     AEK/MEDQ  D:  11/11/2007 15:25:15  T:  11/12/2007 04:54:09  Job:  [id number]

## 2011-03-07 NOTE — Procedures (Signed)
NAMEAVANELLE, PIXLEY              ACCOUNT NO.:  [id number]   MEDICAL RECORD NO.:  [id number]          PATIENT TYPE:  REC   LOCATION:  [facility]                          FACILITY:  [facility]   PHYSICIAN:  Erick Colace, M.D.DATE OF BIRTH:  1976-03-06   DATE OF PROCEDURE:  DATE OF DISCHARGE:                               OPERATIVE REPORT   This is a left L5 dorsal ramus injection radiofrequency neurotomy, left  L4 medial branch block radiofrequency neurotomy, left L3 medial branch  block and radiofrequency neurotomy.   INDICATIONS:  Lumbar facet mediated pain has had good results with  medial branch blocks x2, has had good results with right sided  radiofrequency same levels approximately 1 month ago.   Informed consent was obtained after describing risks and benefits of the  procedure to the patient.  These include bleeding, bruising, infection,  loss of bowel and bladder function, temporary or permanent paralysis.  She elects to proceed and has given written consent.  The patient placed  prone on fluoroscopy table.  Betadine prep, sterile drape, 25 gauge inch  and a half needle was used to anesthetize skin and subcu tissue, 1%  lidocaine x2 mL, then a 20-gauge 10-mm RF needle was 10 mm curved active  tip was inserted under fluoroscopic guidance targeting the left S1 SAP  sacral ala junction, bone contact made confirmed with lateral imaging.  Motor stim x3 volts demonstrated no lower extremity twitch followed by  RF lesioning followed by injection of 1 mL of solution containing 1 mL  of 4 mg/mL dexamethasone and 3 mL of 1% MPF lidocaine followed by RF  lesioning 70 degrees x 70 seconds.  Next the left L5 SAP transverse  junction targeted bone contact made confirmed with lateral imaging.  Motor stim x3 volts demonstrated no lower extremity twitch followed by  RF lesioning 70 degrees x 70 seconds and the left L4 SAP transverse  junction targeted bone contact made confirmed with lateral  imaging.  Motor stim 3 volts demonstrated no lower extremity twitch followed by  injection of 1 mL of the dexamethasone lidocaine solution followed by RF  lesioning 70 degrees x 70 seconds.  The patient tolerated the procedure  well.  Pre and post injection vitals stable.      Erick Colace, M.D.  Electronically Signed     AEK/MEDQ  D:  04/08/2007 15:45:27  T:  04/09/2007 07:16:53  Job:  [id number]

## 2011-03-07 NOTE — Assessment & Plan Note (Signed)
Ms. Pitner returns today.  She has had a rough week.  She did have good  relief for about two weeks following the sacroiliac injection, but over  the last week or so this has worn off, and she has had recurrence of  right buttock and low back pain as well as some proximal thigh pain.   She has had no new medical problems in the interval time.  Her pain has  been averaging about 7/10.  It interferes with activity at an 8/10  level.  Sleep is poor.  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   CURRENT MEDICATIONS:  1. Hydrocodone 7.5/325 one p.o. t.i.d.  2. Tramadol 100 t.i.d.  3. Celebrex 200 b.i.d.  4. She also uses a Lidoderm patch to her buttocks area as well.  This      has been helpful.   PHYSICAL EXAMINATION:  GENERAL:  No acute distress.  Mood and affect  appropriate.  BACK:  Tenderness in PSIS area.  She has good hip internal and external  rotation bilaterally.  Knee and ankle range of motion is normal.  Her  spine range of motion is good in forward flexion, however pain with  extension and limitation with range of motion and extension to about  25%.  Fro ward flexion, she is 75-100%.   She is able to toe walk, heel walk.  Lower extremity strength is normal.   IMPRESSION:  1. Right sacroiliac pain.  She has had good relief for a couple of      weeks with sacroiliac injection.  We will need to repeat.  If she      continues to get only short-term relief with sacroiliac injection,      consider sacroiliac radio frequency.  2. In terms of her medication management, we will change her Tramadol      to Ultram ER 200 mg per day.  Continue hydrocodone 7.5/500 t.i.d.      Continue Lidoderm patch on 12, off 12.  Give a trial of      nortriptyline 10 mg at night to help with sleep.   I will see her back for the SI injection in one or two weeks.      Erick Colace, M.D.  Electronically Signed     AEK/MedQ  D:  12/09/2007 11:33:38  T:  12/09/2007 18:03:21  Job #:  [id number]

## 2011-03-07 NOTE — Assessment & Plan Note (Signed)
PRIMARY CARE PHYSICIAN:  Dr. Maryella Shivers.   Danielle Harrington is a 35 year old female with chronic right-sided low back  pain extending into the right hip region.  She has had relief with right-  sided L3-4, L4-5, L5-S1 facet injections done in November 25, 2004 and  Mar 22, 2005.  She had some improvement for a period of time but then  had recurrence of symptoms in 2007.  Had an MRI of the lumbar spine  showing minimal disk degeneration of L4-5 without disk protrusion.  She  was diagnosed with painful right sacroiliac as well as right  trochanteric hip at her right SI, non-fluoroscopically guided injection,  August 16, 2006, which really did not help a whole lot, in terms of the  SI area, but did have some help with a trochanteric bursa injection.  She then presented to my clinic with continued low back pain.  A trial  of acupuncture was done, which she did have some good results with;  however, they were fairly short-lived and needed repeated treatments,  which was not practical for her.   She has been on Vicodin for pain for about a year now.  She has had  reduction of dosage after bilateral medial branch blocks x2 and then  radiofrequency neurotomy.  A right lumbar RF was performed in May, 2008  and left lumbar RF was done in June.  She had good reduction of pain at  that point of a 4/10 but starting in July, she started getting some  right-sided low back pain that radiated into the foot.  She had not had  radiation pain down the knee prior to that time.  Therefore, repeat MRI  was performed, but this did not demonstrate any herniated disk.  She was  felt to have a right S1 radiculitis and underwent S1 transforaminal  injections x3, which did temporarily help the pain.   Due to persistence of symptoms on September 09, 2007, underwent EMG of  the right lower extremity, which demonstrated no significant  abnormalities.   INTERVAL MEDICAL HISTORY:  Since last visit, she has had upper  respiratory infection.  Her son was in the hospital with croup at  [facility] for a few days.  Her pain is averaging 7/10 but no longer  extends beyond the knee.  Of note is that she has completed physical  therapy.  Her pain interferes at a moderate level.  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   PHYSICAL EXAMINATION:  Blood pressure 128/85, pulse 98, respirations 18,  O2 sat is 97% on room air.  A well-developed and well-nourished female.  She has a cough.  She is  oriented x3.  Gait is normal.  She is able to toe-walk, heel-walk.  Her back shows no  evidence of scoliosis.  She is able to forward-flex without difficulty;  however, extension of the spine causes pain in the low back on the right  side area.  She does have some pain into the upper sacrum as well.  No  pain over the PSIS.  Her lower extremity range of motion is normal.  Deep tendon reflexes are normal.  Strength is normal.  Sensation is  normal.   Urine drug screen testing reviewed with patient shows no illicit drugs  or inappropriate narcotic analgesics.  It basically shows that she is  taking her medications as prescribed, which would be the hydrocodone.  She still had some trace of morphine, which she had been  taken off of  and switched to hydrocodone at that time.   IMPRESSION:  Lumbar, mainly axial pain going into the posterior thigh,  most consistent with facet syndrome.  No longer has signs of  radiculitis.   PLAN:  1. We will DC the Neurontin.  2. DC Skelaxin.  She does not seem to have much muscle spasm.  3. It appears that her facet radiofrequency has worn off and will      repeat medial branch blocks on that side in an effort to, once      again, reduce her hydrocodone dose from her 5 mg down from her 10.      May need to repeat radiofrequency neurotomy.  4. We will refill her hydrocodone 10/325 1 p.o. t.i.d., 1 month's      supply.  5. Tramadol ER will be DC'd secondary to cost issues  and will change      the tramadol 50 mg 2 p.o. b.i.d. to the regular release.      Danielle Harrington, M.D.  Electronically Signed     AEK/MedQ  D:  09/27/2007 16:21:02  T:  09/28/2007 09:54:51  Job #:  [id number]

## 2011-03-07 NOTE — Assessment & Plan Note (Signed)
REASON FOR VISIT:  Follow up of right lower extremity pain related to  probable lumbar radiculitis.  Since I last saw her, her symptoms are  about similar.  She has been going through physical therapy.  She has  been tolerating this.  She has been able to work full-time.  Her pain is  aggravated by sitting and driving.  She drives up to 400 miles a day.  Her pain improves with rest and heat.   EXAMINATION:  No apparent distress.  Mood and affect appropriate.  She  has positive straight leg raising on the right side now.  Normal on the  left.  Normal sensation to pinprick other than a slightly diminished  light touch, lateral border of the right little toe.  She is able to toe  walk, heel walk.  Deep tendon reflexes are normal.   IMPRESSION:  Probable lumbar radiculitis S1 has persisted despite some  physical therapy and nonsteroidals as well as elevated in pain  medication.   PLAN:  Order MRI and schedule for epidural.      Erick Colace, M.D.  Electronically Signed     AEK/MedQ  D:  06/14/2007 10:46:31  T:  06/15/2007 12:24:07  Job #:  [id number]

## 2011-03-07 NOTE — Assessment & Plan Note (Signed)
Patient last seen by me on Feb 28, 2008.  Worked her in for flareup of  her sacroiliac pain.  She has had a left mid foot sprain.  Has been  using a CAM walker, altered gait, and some flare-up in her right SI  joint discomfort.  She has been able to work, although with increased  pain.  She was bumped up to two 7.5 hydrocodone t.i.d., and these make  her too drowsy, so she spreads them out throughout the day, taking 1  tablet every couple of hours instead.  Her pain is worse during the  evening hours, worse with walking and sitting.  Improves with rest,  heat, medication, and TENS unit.  She can walk with assistance.  She  works 40 hours a week for [employer], does quite a bit of driving.  Review  of systems is otherwise negative.   PHYSICAL EXAMINATION:  Blood pressure 144/88, pulse 100, respirations  18, O2 sat 98% on room air.  GENERAL:  No acute distress.  Mood and affect anxious.  Gait is normal.  BACK:  There is mild tenderness over the PSIS on the right side only.  She has no skin irritation.  Normal to inspection.  Her lower extremity is poor.  Did not take her foot out of the CAM  walker on the left side.  Her range of motion is normal in the hip,  knee, and ankle on the right side, and the hip and knee on the left  side.   IMPRESSION:  Sacroiliac pain:  She is about two weeks post sacroiliac  radiofrequency ablation.  She has had some flare-up of pain related to  using a CAM walker on the left side.  She is still getting over the  radiofrequency ablation.   PLAN:  We will change her hydrocodone to 10/325 1 p.o. q.i.d., #120 for  a month's supply.  I will see her back in a month.      Erick Colace, M.D.  Electronically Signed     AEK/MedQ  D:  03/09/2008 16:21:52  T:  03/09/2008 17:34:26  Job #:  [id number]   cc:   Melina Fiddler, MD

## 2011-03-07 NOTE — Procedures (Signed)
NAMECECIA, EGGE              ACCOUNT NO.:  [id number]   MEDICAL RECORD NO.:  [id number]          PATIENT TYPE:  REC   LOCATION:  [facility]                          FACILITY:  [facility]   PHYSICIAN:  Erick Colace, M.D.DATE OF BIRTH:  10-16-1976   DATE OF PROCEDURE:  10/31/2007  DATE OF DISCHARGE:                               OPERATIVE REPORT   This is a trigger point injection gluteus medius musculature, right.  Three areas marked of gluteus medius area on the right side, prepped  with Betadine and 25-gauge inch and a half needle. A solution containing  0.5 mL of 40 mg/mL Depo-Medrol and 2.5 mL of 1% lidocaine were injected,  1 mL into each site after negative drawback for blood.  The patient  tolerated the procedure well.  Pre and post injection vitals stable.  Post injection instructions given.   INDICATIONS:  Pain only partially relieved with medication management  and other conservative care.      Erick Colace, M.D.  Electronically Signed     AEK/MEDQ  D:  10/31/2007 14:01:51  T:  10/31/2007 08:65:78  Job:  [id number]

## 2011-03-07 NOTE — Assessment & Plan Note (Signed)
The patient states that her left foot has been bothering her.  She has  been in a CAM walker.  She feels like this has thrown off her gait,  putting more pressure on the right lower extremity and flaring up her  right SI joint.   Her pain improves with rest, heat, medications, TENS, injections.  She  does not feel like she has a very good relief of her pain at this point.  She was wondering whether her pain medicine could go up for a couple of  weeks as she gets over her ankle strain.   She has not been using her crutches as instructed because she has been  still trying to work and she cannot carry items while using her  crutches.   She has had some problems with anxiety.  She feels a bit moody, has not  been sleeping very well.   PHYSICAL EXAMINATION:  VITAL SIGNS:  Her blood pressure is 121/91, pulse  78, respirations 22.  GENERAL:  No acute distress.  Orientation x3.  MUSCULOSKELETAL:  Gait is limping and having problems with putting  weight on the left lower extremity .  I had her take off her CAM walker.  She has no evidence of swelling or erythema or ecchymosis in the left  lower.  She does have some reduced range of motion at the ankle and some  tenderness on the dorsum of the foot.  The foot is warm.  BACK:  The injection sites look okay.  No evidence of erythema or  drainage.  She has some tenderness in the lumbar area bilaterally.  NEUROLOGIC:  Her motor strength is 5/5 bilateral hip flexors, knee  extensors.  Did not test left ankle dorsiflexor but 5/5 on the right  side.  Deep tendon reflexes are okay at bilateral patella and right  Achilles.  I did not test the left.   IMPRESSION:  1. Right sacroiliac pain.  2. Recent RF.  This may be partially what is flaring up some of her      pain.  She has had a recent left ankle sprain and orthopedist is      treating her for this, however, this may be altering her gait      pattern, particularly with her noncompliance with  these crutches.   Overall, I will let her take a bit more of her hydrocodone.  She has  been on 7.5 t.i.d.  She can double up on this as needed for the next  couple of weeks.  I have given her a prescription for #90 on May 4, so I  anticipate she runs out a bit early but certainly not prior to Mar 09, 2008. I will see her back on Mar 18, 2008.  I may need to bridge her  depending on how much she takes.  We will give her a trial of Flector  patch.  She can cut it in half and put half of it on the dorsum of the  foot and half over the SI area b.i.d.  I am still awaiting urine drug  screen which was done last visit.      Erick Colace, M.D.  Electronically Signed     AEK/MedQ  D:  02/28/2008 16:48:08  T:  02/28/2008 17:43:51  Job #:  [id number]   cc:   Melina Fiddler, MD

## 2011-03-07 NOTE — Procedures (Signed)
Danielle Harrington, Danielle Harrington              ACCOUNT NO.:  [id number]   MEDICAL RECORD NO.:  [id number]          PATIENT TYPE:  REC   LOCATION:  [facility]                          FACILITY:  [facility]   PHYSICIAN:  Erick Colace, M.D.DATE OF BIRTH:  03/19/1976   DATE OF PROCEDURE:  DATE OF DISCHARGE:                               OPERATIVE REPORT   PROCEDURE:  Right L4 radiofrequency neurotomy, right L5 radiofrequency  neurotomy and right S1, S2, S3 dorsal ramus radiofrequency neurotomies  under fluoroscopic guidance.   INDICATION:  Right sacroiliac pain which has improved on at least two  occasions by about a 50% reduction that lasted a couple of weeks but got  back to her usual level.  Her pain is only partially responsive to  medication management including Celebrex, Ultram and hydrocodone.   Also she has a history of lumbar facet syndrome, lumbar spondylosis  without myelopathy.   Informed consent was obtained after describing risks and benefits of the  procedure with the patient.  These include bleeding, bruising,  infection.  She elects to proceed and has given written consent.  The  patient is placed prone on fluoroscopy table.  Betadine prep, sterile  drape, 25-gauge 1-1/2-inch needle was used to anesthetize the skin and  subcu tissue with 1% lidocaine x2 mL.  Then a 20-gauge 10-cm RF needle  with 10 mm curved active tip was inserted under fluoroscopic guidance  first targeting the lateral aspect of right S1 foramen.  Bone contact  made.  Sensory stim at 50 Hz confirmed proper needle location, followed  by injection of 1 mL of a solution containing 1 mL of 4 mg/mL  dexamethasone and 4 mL of 2% lidocaine followed by radiofrequency  lesioning 70 degrees Celsius for 70 seconds.  Then the right S1 SAP  sacral ala junction, targeted bone contact made, confirmed with lateral  imaging.  Sensory stim at 50 Hz followed by motor stim at 2 Hz confirmed  proper needle location followed by  confirmation with lateral imaging and  then an injection of 1 mL of dexamethasone lidocaine solution and  radiofrequency lesioning 70 degrees Celsius for 70 seconds.  Then the  right L5 SAP transverse process junction targeted, bone contact made,  confirmed lateral imaging.  Sensory stim at 50 Hz followed by motor stim  at 2 Hz confirmed proper needle location, followed by injection 1 mL of  dexamethasone lidocaine solution and radiofrequency lesioning at 70  degrees Celsius for 70 seconds.  Then the right S2 lateral aspect of the  sacral foramen was approximated with the RF needle.  Sensory stim at 50  Hz confirmed proper needle location followed by injection of 1 mL of  dexamethasone lidocaine solution and radiofrequency lesioning 70 degrees  Celsius for 70 seconds.  Then the right S3 lateral aspect of the sacral  foramen was targeted, bone contact made.  Sensory stim at 50 Hz  confirmed proper needle location followed by injection of 1 mL of  dexamethasone lidocaine solution and radiofrequency lesioning 70 degrees  Celsius for 70 seconds.  The patient tolerated procedure well.  Post  injection  instructions given, preinjection pain level 10/10, post  injection 6/10.  Follow-up in approximately three weeks.      Erick Colace, M.D.  Electronically Signed    AEK/MEDQ  D:  02/24/2008 17:07:09  T:  02/24/2008 17:38:34  Job:  [id number]

## 2011-03-10 NOTE — Procedures (Signed)
NAMEMARKIYA, Danielle Harrington              ACCOUNT NO.:  [id number]   MEDICAL RECORD NO.:  [id number]          PATIENT TYPE:  REC   LOCATION:  [facility]                          FACILITY:  [facility]   PHYSICIAN:  Erick Colace, M.D.DATE OF BIRTH:  1975/10/27   DATE OF PROCEDURE:  DATE OF DISCHARGE:                               OPERATIVE REPORT   REASON FOR VISIT:  Acupuncture treatment for low back pain.  Last visit  November 26, 2006.  This is visit #5.   Her pain is now centralized.  She no longer has right posterior thigh  pain, she just has mainly bilateral low back pain.   Treatment consisted of bilateral BL-21, BL-23, BL-25, BL-27, and BL29  with 4 Hz stimulation x30 minutes for four-needle pairs and 20 Hz  stimulation x30 minutes for one-needle pair.  Patient tolerated the  procedure well.      Erick Colace, M.D.  Electronically Signed     AEK/MEDQ  D:  12/04/2006 15:01:46  T:  12/04/2006 18:22:37  Job:  [id number]   cc:   Caralyn Guile. Ethelene Hal, M.D.  Fax: [phone]   Gabriel Earing, M.D.  Fax: [phone]

## 2011-03-10 NOTE — H&P (Signed)
[place]. [facility]  Patient:    Danielle Harrington, Danielle Harrington Visit Number: [id number] MRN: [id number]          Service Type: EMS Location: ED Attending Physician:  Pearletha Alfred Dictated by:   Sandria Bales. Ezzard Standing, M.D. Admit Date:  10/20/2001   CC:         Marina Gravel, M.D.  Willis Modena. Dreiling, M.D. at [facility]   History and Physical  DATE OF BIRTH:  06/28/1976  HISTORY OF PRESENT ILLNESS:  This is a 35 year old white female who is a patient of Dr. Marina Gravel, and seen through [facility] under various physicians there, who had the acute onset of abdominal pain about one or two this morning.  About two hours later, she became nauseated and vomited, and she tried taking some medicine at home which really did not help, and presented to the [facility] Emergency Room early this morning.  She has had a prior history of endometriosis and underwent a laparoscopy in November of 2001, by Dr. Trudie Reed.  She is now followed by Dr. Marina Gravel because of insurance changes.  She denies a history of peptic ulcer disease, liver disease, pancreatic disease, and change in bowel habits.  ALLERGIES:  No known drug allergies.  CURRENT MEDICATIONS:  _________ and Lupron ingested and Zoloft.  PAST SURGICAL HISTORY:  Other operations other than laparoscopy, she had a right knee reconstruction for an ACL injury.  She ________.  REVIEW OF SYSTEMS:  PULMONARY:  Smokes a half-pack of cigarettes a day.  Knows it is bad for her health and knows she needs to quit.  Her husband also smokes.  CARDIAC:  No history of heart disease, chest pain, or hypertension. GASTROINTESTINAL:  See History of Present Illness.  UROLOGIC:  No history of kidney stones or kidney infections.  GYN:  She has not had a period for six months since she has been on the Lupron.  She has never been pregnant.  Has no children.  She is accompanied by her husband in the emergency  room.  PHYSICAL EXAMINATION:  VITAL SIGNS:  Temperature is 97.1, blood pressure 147/86, pulse 85, and respirations 18.  GENERAL:  She is a well-nourished white female, alert and cooperative on physical exam.  She does not really look all that uncomfortable.  HEENT:  Unremarkable.  NECK:  Supple without mass or thyromegaly.  LUNGS:  Clear to auscultation.  HEART:  Regular rate and rhythm without murmur or rub.  ABDOMEN:  Soft.  She had some mild tenderness in her suprapubic area and right lower quadrant.  She really has no guarding and no rebound.  Her bowel sounds are decreased.  EXTREMITIES:  _________ all four extremities and a well-healed scar on the right knee.  She does have a belly button ring and she has laparoscopy incisions in her belly.  LABORATORY DATA:  Labs that I have show a white blood count of 15,600, hemoglobin 12.9, hematocrit 38.4.  Her sodium was 139, potassium 3.4, chloride of 107, glucose of 92.  Her creatinine was 0.7.  Amylase and lipase were normal.  Urinalysis was negative.  CT scan reviewed with Dr. Adriana Reams shows two things.  First, she appears to have an appendicitis which the appendix is down in the right pelvis, may be right above the right ovary and tube.  She also has a double left ureter.  IMPRESSION: 1. Acute appendicitis.  Discussed with the patient and her husband  about    proceeding with appendectomy.  The husbands father had appendicitis and    they were familiar with that disease.  I felt that I had about a 60-80%    chance that I could do this laparoscopically.  I talked about the    possibility of open surgery, the length of hospitalization, potential    complications. 2. Endometriosis, stable at this time. 3. Smokes cigarettes and knows it is bad for her health. Dictated by:   Sandria Bales. Ezzard Standing, M.D. Attending Physician:  Pearletha Alfred DD:  10/20/01 TD:  10/20/01 Job: [id number]

## 2011-03-10 NOTE — Group Therapy Note (Signed)
REASON FOR CONSULTATION:  Evaluation need for acupuncture treatment.   HISTORY:  A 35 year old female who, sometime either in November or December  of 2005, twisted while in bed and noted onset of back pain since that time.  She went to the urgent care the next day.  From there, she was referred to  Dr. Darrelyn Hillock at [facility] and per her report, received Medrol  Dosepak as well as some Percocet as part of her treatment.  She was then  referred to Dr. Danne Harbor.  She had had CT myelogram of her low back as  well as MRI showing no compressive lesions, although I do not have the  official report.  After evaluation per Dr. Ethelene Hal, she underwent facet joint  injections right L3-4, 4-5, 5-S1 and the first set was very helpful for her  pain and they lasted more than two weeks, had received some physical therapy  as well, but then they wore off and had repeat injections done on Mar 22, 2005, same location but these were not helpful.  She describes her pain as  being mainly centered in the right side of the low back above the iliac  crest area and radiating down into the right foot and ankle.  Her average  pain she rates as an 8/10, describes a burning, stabbing, constant aching.  Pain interference score general activity 5, relationship with other people  1, enjoyment of life 4.  Pain is worse toward the evening.  Pain is worse  with walking, standing and sitting, improves with heat and medications.   CURRENT MEDICATION:  Percocet 10/650 one p.o. q.i.d.   FUNCTIONAL STATUS:  Ability to climb steps is intact.  She drives.  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   REVIEW OF SYSTEMS:  Positive for constipation which she attributes to her  Percocet.   Also has had some tremor, anxiety and depression but no suicidal thoughts.   PAST MEDICAL HISTORY:  1.  Endometriosis.  2.  Appendicitis.  3.  Right ACL rupture while skiing.   PAST SURGICAL HISTORY:  1.  Laparoscopic  debridement of endometrial tissue March 2005, November 2003      and also either in 2001 or 2002.  2.  Also had an appendectomy December 2002.  3.  Tonsillectomy July 1997.  4.  Right ACL reconstruction June 1995.   SOCIAL HISTORY:  She is married.  She smokes half a pack a day.  She drinks  alcohol socially.   FAMILY HISTORY:  Positive for heart disease, diabetes, hypertension,  psychiatric problems with alcohol abuse.   PHYSICAL EXAMINATION:  GENERAL APPEARANCE:  No acute distress.  Mood and  affect appropriate.  Visit was chaperoned by Barton Fanny, LPN.  VITAL SIGNS:  Blood pressure 123/77, pulse 90, respirations 16, O2  saturation 99% on room air.   Her back has some PSIS tenderness on the right side.  She has positive  Faber's on the right with PSIS area pain.  Left side just has some hip  tightness but otherwise no pain.  She has full range of motion bilateral  upper and lower extremities, otherwise, and she has normal intact sensation  of bilateral upper and lower extremities as well as normal deep tendon  reflexes bilateral upper and lower extremities.  She has normal strength  bilateral upper and lower extremities.   Her lumbar range of motion is at least 75% forward flexion but only 25% of  normal  extension.   Her muscle bulk is good.  No gait abnormalities.   MEDICATIONS:  1.  Wellbutrin 300 mg a day.  2.  Seroquel 100 mg daily.  3.  Zyrtec p.r.n.  4.  Percocet as noted above.  5.  Klonopin 0.5 p.o. p.r.n.   IMPRESSION:  1.  Low back pain, some radiating pattern.  Exam most consistent with      sacroiliac versus facet arthropathy, some elements of both and      inconsistent response to facet injection. I think she would benefit from      acupuncture treatment and have recommended six initial treatments done      twice a week x3 weeks to evaluate this modality if she gets pain      reduction of at least zero to one half.  Would continue for an      additional six  treatments and then observe duration.  2.  Will do acupuncture without electrical stimulation treatment as part of      consultation to assess for tolerance of needles today.   I will see her back should she wish to initiate treatment.  She is checking  on insurance versus self-pay options.   ADDENDUM:  Acupuncture treatment done bilateral  BL60, bilateral KI3 and  bilateral SP6, needles inserted, placed for 20 minutes, removed without  difficulty.  Patient tolerated well.      Erick Colace, M.D.  Electronically Signed     AEK/MedQ  D:  06/05/2005 13:35:58  T:  06/05/2005 16:31:22  Job #:  [id number]   cc:   Caralyn Guile. Ethelene Hal, M.D.  [address]  [place]  [place] [address]  Fax: [phone]   Gabriel Earing, M.D.  [address]  [place]  [place] [address]  Fax: [phone]   Georges Lynch. Darrelyn Hillock, M.D.  [facility]  [address]  [address]  [place]  [place] [address]  Fax: [phone]

## 2011-03-10 NOTE — Procedures (Signed)
Danielle Harrington, Danielle Harrington              ACCOUNT NO.:  [id number]   MEDICAL RECORD NO.:  [id number]          PATIENT TYPE:  REC   LOCATION:  [facility]                          FACILITY:  [facility]   PHYSICIAN:  Erick Colace, M.D.DATE OF BIRTH:  03-Mar-1976   DATE OF PROCEDURE:  03/04/2007  DATE OF DISCHARGE:                               OPERATIVE REPORT   PROCEDURE:  Right L5 dorsal ramus injection and radiofrequency  neurotomy, with right L4 medial branch block and radiofrequency  neurotomy under fluoroscopic guidance.   INDICATION:  Lumbar facet-mediated pain demonstrated by two successive  sets of medial branch blocks resulting in 50% of greater pain relief.  Last injection performed February 12, 2007.  Her pain has thus far been  unresponsive to acupuncture and narcotic analgesic medications.   Informed consent was obtained after describing risks and benefits of the  procedure to the patient.  These include bleeding, bruising, infection,  loss of bladder and function, temporary or permanent paralysis.  She  elects to proceed and has given written consent.  The patient placed  prone on fluoroscopy table.  Betadine prep, sterile drape.  A 25-gauge  inch and half needle was used to anesthetize the skin and subcu tissue  with 1% lidocaine x2 mL.  Then a 20-gauge 10-cm RF needle with a 10 mm  curved active tip was inserted under fluoroscopic guidance, first  charting the right S1 SAP-sacral ala junction, bone contact, made  confirmed with lateral imaging.  Motor Stim x3 V demonstrated no lower  extremity twitch, followed by injection of 1 mL of a solution containing  1 mL of 4 mg/mL dexamethasone and 2 mL of 1% MPF lidocaine, followed by  RF lesioning at 70 degrees x70 seconds.  Then the right L5 SAP-  transverse junction targeted and bone contact made, confirmed with  lateral imaging.  Motor Stim x3 V demonstrated no lower extremity  twitch, followed by injection of 1 mL of the  dexamethasone-lidocaine  solution and RF lesioning at 70 degrees s70 seconds.  Next, the right L4  SAP-transverse junction targeted and bone contact made, confirmed  lateral imaging.  Motor Stim x3 V  demonstrated no lower extremity  twitch, followed by RF lesioning, 70 degrees x70 seconds.  The patient  tolerated the procedure well.  Post injection vitals stable.  Post  injection instructions given.      Erick Colace, M.D.  Electronically Signed     AEK/MEDQ  D:  03/04/2007 14:34:41  T:  03/04/2007 15:54:09  Job:  [id number]

## 2011-03-10 NOTE — Op Note (Signed)
[place]. [facility]  Patient:    Danielle Harrington, Danielle Harrington Visit Number: [id number] MRN: [id number]          Service Type: Attending:  Sandria Bales. Ezzard Standing, M.D. Dictated by:   Sandria Bales. Ezzard Standing, M.D. Proc. Date: 10/20/01   CC:         Marina Gravel, M.D.             Willis Modena. Dreiling, M.D.                           Operative Report  PREOPERATIVE DIAGNOSIS:  Acute appendicitis.  POSTOPERATIVE DIAGNOSIS:  Acute appendicitis.  PROCEDURE:  Laparoscopic appendectomy.  SURGEON:  Sandria Bales. Ezzard Standing, M.D.  ANESTHESIA:  General endotracheal.  ESTIMATED BLOOD LOSS:  Minimal.  INDICATIONS FOR PROCEDURE:  Ms. Gulotta is a 35 year old white female who has had about a 12 hour history of increasing abdominal pain, nausea, vomiting, leukocytosis, and a CAT scan consistent with acute appendicitis.  The patient now comes for attempted laparoscopic appendectomy.  DESCRIPTION OF PROCEDURE:  The patient was placed in a supine position.  Was given 1 g of Cefotan at the initiation of procedure.  Had a Foley catheter in place.  Left arm tucked, and her abdomen was prepped with Betadine solution and sterilely draped.  An infraumbilical incision was made with sharp dissection, carried down to the abdominal cavity.  A 10 mm 0 degree laparoscope was inserted through a 12 mm Hasson trocar, and the Hasson trocar was secured with a 0 Vicryl suture.  Two additional trocars were placed, a 10 mm trocar in the left lower quadrant, and a 5 mm trocar in the right upper quadrant.  Both of these trocars were placed under direct visualization.  Exploration was then carried out.  Right and the left lobes of the liver were unremarkable.  The stomach was unremarkable, but slightly dilated.  Inserting the camera to the pelvis, her uterus and ovaries were grossly unremarkable.  She had an inflamed appendix draped over her right pelvic brim, consistent with what was seen on the CT scan.  I was able to  mobilize the appendix, grab it, divide the mesentery with the harmonic scalpel.  I used the Ethicon 35 mm endostapler for the base of the appendix, placed the appendix in the endocatch bag, and delivered it through the umbilicus.  The appendix was then sent to pathology for permanent analysis.  I then irrigated out the pelvis which had a little bit of cloudy fluid in it, and around the base of the appendix.  I had to put an additional two staples at the base of the appendix because of some bleeding through the staple line. Otherwise, the procedure went well.  I removed the trocars under direct vision.  There was no bleeding at any trocar site.  The umbilical trocar was closed with a 0 Vicryl suture.  Sponge and needle count were correct at the end of the case.  The patient tolerated the procedure well, was transferred to the recovery room in good condition. Dictated by:   Sandria Bales. Ezzard Standing, M.D. Attending:  Sandria Bales. Ezzard Standing, M.D. DD:  10/20/01 TD:  10/20/01 Job: [id number]
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
## 2011-03-10 NOTE — Procedures (Signed)
Danielle Harrington, Danielle Harrington              ACCOUNT NO.:  [id number]   MEDICAL RECORD NO.:  [id number]          PATIENT TYPE:  REC   LOCATION:  TPC                          FACILITY:  [facility]   PHYSICIAN:  Erick Colace, M.D.DATE OF BIRTH:  1976-08-26   DATE OF PROCEDURE:  DATE OF DISCHARGE:                               OPERATIVE REPORT   Bilateral medial branch blocks.   INDICATION:  Lumbar facet arthropathy with prior temporary relief from  lumbar medial branch blocks done 01/07/2007  going 9/10 to 2/10 here for  second confirmatory set.   Informed consent was obtained after describing risks and benefits of  procedure to the patient.  These include bleeding, bruising, infection,  loss of bowel of bladder function, temporary or permanent paralysis.  She elects to proceed and has given written consent.   The patient placed prone on fluoroscopy table.  Betadine prep, sterile  drape.  25 gauge 1-1/2 inch needle was used to anesthetize skin and  subcu tissue 1% lidocaine 22 mL. Then a  22 gauge 3-1/2 inch spinal  needle was inserted under fluoroscopic guidance first starting the left  S1 SAP sacral ala junction, bone contact made confirmed with lateral  imaging.  Omnipaque 180 demonstrated no intravascular uptake, followed  by injection of 0.5 mL of 2% MPF lidocaine.  The right S1 SAP sacral ala  junction target bone contact made confirmed with lateral imaging.  Omnipaque 180 x 0.5 mL demonstrated no intravascular uptake then 0.5 mL  of 2% lidocaine injected.  Next the right L5 SAP transverse process  junction targeted bone contact made confirmed with lateral imaging.  Omnipaque 180 x 0.5 mL demonstrated no intravascular uptake and 0.5 mL  of 2% lidocaine injected.  Then the right L4 SAP transverse process  junction targeted bone contact made confirmed with lateral imaging.  Omnipaque 180 x 0.5 mL demonstrated no intravascular uptake then 0.5 mL  of the 2% lidocaine solution injected and  the left L4 SAP transverse  process junction targeted bone contact made confirmed with lateral  imaging.  Omnipaque 180 x 5 mL demonstrated no intravascular uptake then  0.5 mL of the 2% lidocaine solution injected in the left L5 SAP  transverse junction targeted bone contact made confirmed with lateral  imaging.  Omnipaque 180 x 0.5 mL demonstrated no intravascular and 0.5  mL of 2% lidocaine solution injected.  The patient tolerated procedure  well.  Pre and post injection vitals stable. Pre injection pain level  8/10 with post injection pain level 4/10.  Will schedule back for lumbar  radiofrequency neurotomy under fluoroscopic guidance.      Erick Colace, M.D.  Electronically Signed     AEK/MEDQ  D:  02/11/2007 16:51:27  T:  02/12/2007 07:10:54  Job:  [id number]

## 2011-03-10 NOTE — Op Note (Signed)
NAME:  Danielle Harrington, Danielle Harrington                        ACCOUNT NO.:  [id number]   MEDICAL RECORD NO.:  [id number]                   PATIENT TYPE:  AMB   LOCATION:  SDC                                  FACILITY:  [facility]   PHYSICIAN:  Perkins B. Earlene Plater, M.D.               DATE OF BIRTH:  1976/06/02   DATE OF PROCEDURE:  12/31/2003  DATE OF DISCHARGE:                                 OPERATIVE REPORT   PREOPERATIVE DIAGNOSES:  1. Left lower quadrant and pelvic pain.  2. History of endometriosis.  3. Right lower abdominal adhesions.   POSTOPERATIVE DIAGNOSES:  1. Left lower quadrant and pelvic pain.  2. History of endometriosis.  3. Right lower abdominal adhesions.   PROCEDURE:  Open diagnostic laparoscopy, lysis of left lower quadrant  adhesions, cauterization of two 1 mm endometriosis implants in the posterior  cul-de-sac and chromopertubation.   SURGEON:  Chester Holstein. Earlene Plater, M.D.   ANESTHESIA:  General.   FINDINGS:  Left lower quadrant sigmoid colon adhesions to the left pelvic  sidewall which were filmy and taken down sharply.  Two posterior cul-de-sac  endometriosis implants, 1 mm in diameter.  Blood loss less than 50 mL.  Complications none.  Easy bilateral spill on chromopertubation with indigo  carmine.   BLOOD LOSS:  Less than 50.   COMPLICATIONS:  None.   INDICATION:  The patient with a long history of pelvic pain, history of  endometriosis with previous response to Lupron, who has not been as well  medically since that time with recurrent pelvic pain, predominantly in the  left lower quadrant.  In addition, the patient has desires for future  pregnancy and wanted to document tubal patency.   DESCRIPTION OF PROCEDURE:  The patient taken to the operating room, and  general anesthesia obtained.  She was placed in the [place] stirrups and  prepped and draped in a standard fashion.  The bladder was emptied with a  Foley catheter.  A single-tooth tenaculum was placed on the anterior lip  of  the cervix, and the acorn cannula inserted and secured.   Gowns, gloves were changed and attention turned to the abdomen.  A 10 mm  vertical infraumbilical skin fold incision made with a knife and carried  sharply to the fascia.  The fascia was divided sharply and elevated with  Kocher clamps.  The posterior sheath and peritoneum were entered sharply.  Pursestring suture of 0 Vicryl placed around the fascial defect.  Hasson  cannula inserted and secured.  Pneumoperitoneum obtained with CO2 gas.  Five  mm ports were placed in the right lower quadrant and in the midline just  above the symphysis, each under direct laparoscopic visualization.   Trendelenburg position was obtained and the bowel mobilized superiorly with  the pelvis and abdomen inspected with the above findings as noted.  In  addition, the upper abdomen, liver edge, gallbladder, diaphragms and stomach  all appeared  normal.   As the patient's pain was predominantly on the left side, and with the filmy  adhesions being the only significant finding on this side, these were taken  down sharply.  There was no bleeding.  In the posterior cul-de-sac, there  were two endometriosis implants as previously described.  These were  cauterized with the bipolar cautery.   The indigo carmine was injected through the acorn cannula and both tubes  spilled freely and easily.   As there were no other abnormalities identified, the procedure was  terminated.  The inferior ports were removed and the sites inspected  laparoscopically.  Each was hemostatic.  Scope was removed, gas released,  and Hasson cannula removed.  I inserted my index finger through the fascial  defect and snugged down the pursestring suture.  This obliterated the  fascial defect, and no intra-abdominal contents herniated through prior  closure.  The skin on each site was then closed with a subcuticular 4-0  Vicryl.   The single-tooth was removed as was the acorn  cannula.  The serosa was  hemostatic.   The patient tolerated the procedure well.  There were no complications.  She  was taken to the recovery room awake, alert, in stable condition.                                               Gerri Spore B. Earlene Plater, M.D.    WBD/MEDQ  D:  12/31/2003  T:  12/31/2003  Job:  [id number]

## 2011-03-10 NOTE — Op Note (Signed)
[facility]  Patient:    Danielle Harrington, Danielle Harrington                    MRN: [id number] Proc. Date: 09/20/00 Adm. Date:  [id number] Attending:  Pleas Koch CC:         Ronette Deter, P.A., [facility]   Operative Report  PREOPERATIVE DIAGNOSES:       1. Dyspareunia.                               2. Dysmenorrhea.                               3. Pelvic pain.  POSTOPERATIVE DIAGNOSES:      Endometriosis of left ovarian fossa, cul-de-sac, and right ovary.  OPERATION/PROCEDURE:          Laparoscopic fulguration of endometriosis of left ovarian fossa, cul-de-sac, and right ovary.  SURGEON:                      Georgina Peer, M.D.  ANESTHESIA:                   General, Dr. Pamalee Leyden.  ESTIMATED BLOOD LOSS:         Less than 25 cc.  COMPLICATIONS:                None.  FINDINGS:                     Endometrial implants of left ovarian fossa, surface of the right ovary, and cul-de-sac.  Normal tubes bilaterally.  Normal appendix.  Normal surface of the bowel.  Normal bladder.  INDICATIONS FOR PROCEDURE:    This patient is a 35 year old gravida 0 female with a several month history of increasing dysmenorrhea, dyspareunia, and pelvic pain.  She was tender on pelvic examination.  Cultures were negative. Ultrasound was normal.  The patient is brought in for diagnosis.  DESCRIPTION OF PROCEDURE:     The patient was given general anesthetic after being taken to the operating room and was prepped and draped in the normal sterile fashion.  A subumbilical vertical incision was made and a Veress needle placed, and under low insufflation pressures and with a normal water drop test, 2.5 liters of carbon dioxide insufflated to create a pneumoperitoneum.  The laparoscopic trocar and sleeve were placed and under direct vision a 5 mm suprapubic and 5 mm right lower quadrant port were placed.  The following pelvic findings were noted.  There appeared to be  no injury from laparoscopic trocar placement.  There was a normal uterus and normal surface of the small and large intestine.  The ovaries were fairly mobile and the tubes appeared normal.  There were implants of endometriosis, red, clear, and black in the left ovarian fossa.  A black implant of endometriosis was noted on the surface of the right ovary and implants of endometriosis of the cul-de-sac.  Using a Harmonic scalpel and dissecting hook at a setting of 3 the implants were all completely vaporized and fulgurated. There was no bleeding.  Photographic documentation was made.  The appendix appeared normal.  At this point the ports were removed.  Marcaine was placed in the incisions.  The fascia was closed with a Vicryl suture and the skin closed with  Dexon subcuticular, with interrupted Dexon sutures in the lower port.  Sponge, needle, and instrument counts were correct.  A uterine Hulka manipulator which was placed at the beginning of the procedure through the cervix was removed.  Sponge, needle, and instrument counts were again correct, and the patient was returned to the recovery room area in good condition. DD:  09/20/00 TD:  09/20/00 Job: [id number]

## 2011-03-10 NOTE — Procedures (Signed)
[name], MA              ACCOUNT NO.:  [id number]   MEDICAL RECORD NO.:  [id number]          PATIENT TYPE:  REC   LOCATION:  TPC                          FACILITY:  [facility]   PHYSICIAN:  Erick Colace, M.D.DATE OF BIRTH:  [birth year]   DATE OF PROCEDURE:  11/26/2006  DATE OF DISCHARGE:                               OPERATIVE REPORT   REASON FOR PROCEDURE:  Right low back and buttock pain as well as  posterior thigh pain.  She has had four acupuncture thus far and  scheduled for her fifth.  Pain has reduced from a 9 to approximately 6  on average at this time.  Last visit November 20, 2006.   Treatment today consists of bilateral BL21, BL27, as well as right BL23,  right BL25, right CB30, right BL37, right BL36 and right GB30.  Electrical stimulation at 4 Hz x30 minutes for four needle pairs and 20  Hz x30 minutes for one needle pair.  The patient tolerated the procedure  well.      Erick Colace, M.D.  Electronically Signed     AEK/MEDQ  D:  11/26/2006 12:11:13  T:  11/26/2006 12:45:49  Job:  [id number]   cc:   Gabriel Earing, M.D.  Fax: [phone]   Richard D. Ethelene Hal, M.D.  Fax: [phone]

## 2011-03-10 NOTE — Assessment & Plan Note (Signed)
HISTORY OF PRESENT ILLNESS:  A 35 year old female with lumbar facet  syndrome, pain with extension greatly and with flexion.  She underwent  medial branch blocks at L3-4 and L5 dorsal ramus all on the right side  performed on January 07, 2007.  She went from a 9/10 pain preinjection to  a 2/10 postinjection.  This lasted for one day.  However, following  that, she had an increase in her pain lasting a week or 2.  She had some  mild pain in her right lower extremity.  No weakness or numbness.  She  is basically back at her usual level of pain which she grades right now  at 5/10, but averaging 8.  Her sleep is poor.  She is starting a new job  which requires a lot of driving in 2 weeks.  She has had a URI.  __________ .   PHYSICAL EXAMINATION:  VITAL SIGNS:  Her blood pressure is 122/77, pulse  93, respirations 16, O2 sat 97% on room air.  GENERAL:  In no acute  distress.  Mood and affect appropriate.  Her gait is normal.  BACK:  Tender at the lumbosacral junction only with extension.  She has normal  deep tendon flexes.  Normal FABER  maneuver.  Hip range of motion and  knee and ankle range of motion are good.   IMPRESSION:  Lumbar facet arthropathy with temporary relief during  anesthetic phase of her lumbar axial pain.  I think that weekly  injections would be needed to increase probability of this diagnosis  from 80% to 95% and also qualify her for lumbar radiofrequency  neurotomy.  This would be avoid to escalation of narcotic analgesic  dosage, especially given that she is going into a job that requires  driving.  I will see her back for this within the next week.  This will  put her at about 3 weeks' post the last injection.  We went over the  spinal model as well as rationale, and she agrees with this treatment  plan.  We will give her Motrin prescriptions for her to take after the  injection but not before.      Erick Colace, M.D.  Electronically Signed      AEK/MedQ  D:  01/22/2007 14:26:12  T:  01/22/2007 14:46:30  Job #:  [id number]   cc:   Gabriel Earing, M.D.  Fax: [phone]

## 2011-03-10 NOTE — Assessment & Plan Note (Signed)
Patient returns today after having had 7 acupuncture treatments between  November 09, 2006 and December 18, 2006, unfortunately she did not obtain  significant relief of her right-sided lower back and low extremity pain.  I reviewed once again her history as well as notes from other doctors  including, Dr. Ethelene Hal who preformed facet injections back in 2006, she  had good results after 1 set of injections and the second set was not  particularly helpful. Her last MRI October 07, 2006 showed minimal disc  degeneration L4-5, mild facet arthropathy L4-5.   Pain medicine includes,  Hydrocodone 7.5/500 last written December 11, 2006, she had 50 which is to last her 1 month.   PHYSICAL EXAMINATION:  FABERE's test showed a negative pain in the hip  or  thigh region. She has pain with extension but not with flexion. She  has relief of her back pain with flexion and has full range. She has  good lower extremity strength and normal gait.   IMPRESSION:  Probable facet arthropathy, we will do a facet medial  branch block. She would like something to calm her down prior to the  injection; we are giving her Valium 10 mg prior to, she will have the  consent gone over today. We will see her back next week for the  injection and if at least temporary results, we will need to repeat  prior to consideration of radiofrequency neurotomy.      Erick Colace, M.D.  Electronically Signed     AEK/MedQ  D:  12/25/2006 14:39:15  T:  12/25/2006 16:05:46  Job #:  [id number]   cc:   Dyke Brackett, M.D.  Fax: [phone]

## 2011-03-10 NOTE — Procedures (Signed)
Danielle Harrington, Danielle Harrington              ACCOUNT NO.:  [id number]   MEDICAL RECORD NO.:  [id number]          PATIENT TYPE:  REC   LOCATION:  TPC                          FACILITY:  [facility]   PHYSICIAN:  Erick Colace, M.D.DATE OF BIRTH:  May 02, 1976   DATE OF PROCEDURE:  11/12/2006  DATE OF DISCHARGE:                               OPERATIVE REPORT   Monday, November 12, 2006:   A 35 year old female for acupuncture treatment.  She is interested in  medication for breakthrough pain with some activity related pain.  Acupuncture treatment consisted of needles placed bilaterally at BL21,  BL23, left BL27, right BL25 and BL 27, right GB30, right BL36, right  BL54, and right BL 53.  Electrical stimulation 4 hertz x30 minutes.  The  patient tolerated the procedure well.  Post procedure instructions  given.      Erick Colace, M.D.  Electronically Signed     AEK/MEDQ  D:  11/12/2006 15:31:34  T:  11/12/2006 17:40:31  Job:  [id number]   cc:   Dyke Brackett, M.D.  Fax: [phone]

## 2011-03-10 NOTE — Assessment & Plan Note (Signed)
Tuesday, November 06, 2006   Danielle Harrington is a 35 year old female who was seen by me for acupuncture  evaluation back June 05, 2005.  She has a history of low back pain,  onset some time in December 2005.  CT myelogram and MRI showing no  compressive lesions.  Facet joints were helpful initially, but a repeat  injection did not have any beneficial effect.  She was referred for  acupuncture and had a trial treatment which she tolerated well.  However, after checking for insurance coverage, she found that her  policy at that time did not cover it.  In the interval time, she has had  a child who is a healthy boy born in June 2007.  She has had no new  medical complications.  She did have a C-section for arrest of  dilatation.   She is followed up with Dr. Madelon Lips in regards to her back pain.  She  has pain mainly in the low back and right buttocks and into the right  posterior thigh that is rated at a 7/10 proving with rest and heat and  medications. Sleep is fair.  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   SOCIAL HISTORY:  She is married.  Lives with her husband as well as her  young son.  Social alcohol, and 3 cigarettes per day.   Her blood pressure 125/79, pulse 80, respiratory rate 16, O2 sat 97% on  room air.  GENERAL:  No acute distress.  Mood and affect appropriate.  Orientated  x3.  Gait is normal.  Affect is bright.  Her back is nontender to  palpation, right PSIS.  She has mild paraspinal tenderness on the left  lumbosacral areas.  She has no hip pain.  She has normal range of motion  of the hip, knees and ankles bilaterally.  Her lumbar range of motion is  good.   IMPRESSION:  Lumbar pain chronic.  Maybe facet related given she has had  at least 1 set of facet injections which relieved her pain.  Other  potential pain generated from the sacroiliac joint.  In either case, she  is interested in pursuing acupuncture treatment once again and I think  she would be  appropriate for a trial of six treatments.  We will do  initial treatment today and follow by twice a week treatments.   ADDENDUM:  Acupuncture treatments today consisted of points BL21, BL23,  BL25 as well as bilateral GB30 and BL36.  Electrical stimulation between  the point 4 Hz times 30 minutes.  The patient tolerated the procedure  well.  Postinjection instructions given.      Erick Colace, M.D.  Electronically Signed     AEK/MedQ  D:  11/06/2006 14:25:06  T:  11/06/2006 15:25:28  Job #:  [id number]   cc:   Dyke Brackett, M.D.  Fax: [phone]

## 2011-03-10 NOTE — Procedures (Signed)
NAMEKINZY, WEYERS              ACCOUNT NO.:  [id number]   MEDICAL RECORD NO.:  [id number]          PATIENT TYPE:  REC   LOCATION:  TPC                          FACILITY:  [facility]   PHYSICIAN:  Erick Colace, M.D.DATE OF BIRTH:  13-Jan-1976   DATE OF PROCEDURE:  11/09/2006  DATE OF DISCHARGE:                               OPERATIVE REPORT   PROCEDURE:  Acupuncture treatment.   DIAGNOSIS:  Lumbar degenerative disc disease and low back pain.   The patient tolerated the initial acupuncture treatment for 20 minutes.  This time, she will be treated for 30 minutes.  The pain is mainly right  lower back and buttock area.   The needles were placed bilaterally at BL21 and BL27 as well as right  BL23, BL25, GB30, BL53, and BL54.  Electrical stimulation 4 hertz x30  minutes.  The patient tolerated the procedure well.   She has not had her results back on her urine drug screen, requesting  medications for pain, I have a written prescription for Ultram ER as  well as Lidoderm.      Erick Colace, M.D.  Electronically Signed     AEK/MEDQ  D:  11/09/2006 14:28:45  T:  11/09/2006 16:21:35  Job:  [id number]

## 2011-03-10 NOTE — Discharge Summary (Signed)
Danielle Harrington, Danielle Harrington              ACCOUNT NO.:  [id number]   MEDICAL RECORD NO.:  [id number]          PATIENT TYPE:  INP   LOCATION:  9132                          FACILITY:  [facility]   PHYSICIAN:  Richardean Sale, M.D.   DATE OF BIRTH:  July 21, 1976   DATE OF ADMISSION:  04/12/2006  DATE OF DISCHARGE:  04/15/2006                                 DISCHARGE SUMMARY   ADMISSION DIAGNOSIS:  A 40+ week intrauterine pregnancy with spontaneous  rupture of membranes.   DISCHARGE DIAGNOSIS:  1.  A 40+ week intrauterine pregnancy with spontaneous rupture of membranes.  2.  Status post primary cesarean section for arrest of dilation.   PROCEDURE:  Primary low transverse cesarean section performed on April 12, 2006, for arrest of dilation.   OPERATIVE FINDINGS:  A viable female infant with Apgars of 9 and 9, estimated  birth weight 8 pounds 11 ounces.  Arterial cord pH 7.35.  Placenta with  three-vessel cord and true knot.   HOSPITAL COURSE/HISTORY OF PRESENT ILLNESS:  Please see admission history  and physical for details.  Briefly, this is a 35 year old white female who  presented at [redacted] weeks gestation with spontaneous rupture of membranes.  The  patient was augmented with Pitocin and once labor was adequate she reached a  maximum dilation of 6 cm and failed to change despite adequate labor.  The  patient subsequently underwent a primary cesarean delivery with findings as  noted above.  Her postoperative course was unremarkable.  She remained  afebrile.  On postoperative day #3 she voiding without difficulty, was  tolerating a regular diet, and her pain was controlled with oral pain  medication.  She was subsequently discharged home on postoperative day #3 in  stable condition.   DISPOSITION:  To home.   CONDITION:  Stable.   FOLLOWUP:  The patient will  follow up in four weeks at [facility] for  routine postoperative visit.   LABORATORY STUDIES:  Hemoglobin 9.2.   DISCHARGE  MEDICATIONS:  Iron, ferrous sulfate one tablet p.o. daily,  Percocet one to two tablets p.o. q.4-6h. p.r.n. pain and Motrin as needed.      Richardean Sale, M.D.  Electronically Signed     Richardean Sale, M.D.  Electronically Signed    JW/MEDQ  D:  05/24/2006  T:  05/25/2006  Job:  [id number]

## 2011-03-10 NOTE — Procedures (Signed)
[name], Danielle Harrington              ACCOUNT NO.:  [id number]   MEDICAL RECORD NO.:  [id number]          PATIENT TYPE:  REC   LOCATION:  TPC                          FACILITY:  [facility]   PHYSICIAN:  Erick Colace, M.D.DATE OF BIRTH:  Jan 24, 1976   DATE OF PROCEDURE:  DATE OF DISCHARGE:                               OPERATIVE REPORT   PROCEDURE:  Acupuncture treatment.   The last visit was on December 11, 2006.  This is visit number 7.  She  notes improvement in her low back, right buttock, and posterior thigh  pain.  Medication usage slightly decreased.  Mobility slightly  increased.   Treatment today consists of bilateral BL21, BL27, as well as right BL23,  BL25, right GB30, right BL36, right BL37, and right BL54.  Electrical  stimulation 4 hertz x30 minutes with the exception of one needle pair at  BL27 at 20 hertz.  The patient tolerated the procedure well.  Return in  one week.      Erick Colace, M.D.  Electronically Signed     AEK/MEDQ  D:  12/18/2006 16:52:03  T:  12/18/2006 21:07:05  Job:  [id number]   cc:   Dyke Brackett, M.D.  Fax: [phone]

## 2011-03-10 NOTE — H&P (Signed)
NAME:  Danielle Harrington, Danielle Harrington                        ACCOUNT NO.:  [id number]   MEDICAL RECORD NO.:  [id number]                   PATIENT TYPE:  AMB   LOCATION:  SDC                                  FACILITY:  [facility]   PHYSICIAN:  [place] B. Earlene Plater, M.D.               DATE OF BIRTH:  1976-04-02   DATE OF ADMISSION:  DATE OF DISCHARGE:                                HISTORY & PHYSICAL   PREOPERATIVE DIAGNOSIS:  Chronic left lower quadrant pain; history of  endometriosis.   INTENDED PROCEDURE:  Open laparoscopy, possible lysis of adhesions, or laser  ablation of endometriosis if identified.   HISTORY OF PRESENT ILLNESS:  A 35 year old white female, gravida 0, with a  history of endometriosis previously diagnosed on laparoscopy. He has  recurrent pelvic pain. He has previously been treated with Depo-Lupron in  the past. He has had increasing amounts of pain not managed with narcotic  pain medications or birth control pills. She did receive relief after her  two previous laparoscopies and therefore returns for surgical treatment. She  has had a GI evaluation that showed no evidence of GI pathology, a negative  CT, and ultrasound preoperative.   PAST MEDICAL HISTORY:  Nephrolithiasis and endometriosis.   PAST SURGICAL HISTORY:  Laparoscopy for endometriosis, laparoscopic  appendectomy, diagnostic laparoscopy with adhesiolysis. Last procedure in  July 2003.   MEDICATIONS:  Zoloft, amitriptyline, p.r.n. ketoprofen, Vicodin, and Yasmin.   ALLERGIES:  None.   SOCIAL HISTORY:  One-half pack  a day smoker. Occasional alcohol. No other  drugs.   FAMILY HISTORY:  History of breast cancer at age 41.   REVIEW OF SYSTEMS:  Otherwise negative.   PHYSICAL EXAMINATION:  VITAL SIGNS: Blood pressure 130/90, weight 147,  height 5 feet 3.25 inches.  NECK: Supple with no thyromegaly.  HEART: Regular rate and rhythm.  LUNGS: Clear to auscultation.  ABDOMEN: Liver and spleen normal. No hernias. Previous  laparoscopic incision  has healed well.  LYMPH NODE SURVEY:  Negative neck, axilla, and groin.  PELVIC EXAM: Normal external genitalia. Vagina and cervix normal. Uterus  normal size, nontender, no adnexal masses or tenderness. Urethral meatus,  urethra, and bladder base all normal.   ASSESSMENT:  Chronic left lower quadrant pain, history of pelvic adhesions,  and endometriosis with negative gastrointestinal workup and negative imaging  of pelvis and abdomen. She is requiring narcotic pain medications and is not  responding to birth control pills and declines additional trial of Lupron.   PLAN:  Operative laparoscopy, lysis of adhesions, or CO2 laser ablation of  endometriosis if identified. Will also perform chromopertubation as the  patient is interested in fertility in the near future. Operative risks  discussed including infection, bleeding, damage to bowel, bladder, and  surrounding organs. All questions were answered. The patient wishes to  proceed.  Gerri Spore B. Earlene Plater, M.D.    WBD/MEDQ  D:  12/24/2003  T:  12/24/2003  Job:  [id number]

## 2011-03-10 NOTE — Op Note (Signed)
Danielle Harrington, Danielle Harrington              ACCOUNT NO.:  [id number]   MEDICAL RECORD NO.:  [id number]          PATIENT TYPE:  INP   LOCATION:  9132                          FACILITY:  [facility]   PHYSICIAN:  Richardean Sale, M.D.   DATE OF BIRTH:  March 24, 1976   DATE OF PROCEDURE:  04/12/2006  DATE OF DISCHARGE:                                 OPERATIVE REPORT   PREOPERATIVE DIAGNOSIS:  Arrest of dilation.   POSTOPERATIVE DIAGNOSIS:  Arrest of dilation.   PROCEDURE:  Primary low transverse cesarean section.   SURGEON:  Dr. Richardean Sale.   ASSISTANT:  None.   ANESTHESIA:  Epidural.   COMPLICATIONS:  None.   ESTIMATED BLOOD LOSS:  800 mL.   FINDINGS:  Viable female infant in cephalic presentation with Apgar's of 9 and  9, estimated birth weight 8 pounds 11 ounces.  Arterial cord pH 7.35,  intact placenta with three-vessel cord true knot.   INDICATIONS:  This is a 30-year, white female, gravida 1, para 0 who  presented at [redacted] weeks gestation with spontaneous rupture of membranes and  only 1 cm dilated. The patient received Pitocin for augmentation, made very  slow progress to a maximal dilation at 6 cm.  The patient remained at 6 cm  for approximately 4 hours despite adequate labor as documented by [facility].  Given arrest of dilation, I recommended a primary cesarean delivery.  Prior  to the procedure, the risks, benefits and alternatives of the procedure were  reviewed with the patient in detail and informed consent was obtained. We  discussed the risks which include but are not limited to hemorrhage  requiring transfusion, infection, injury to the bowel, bladder or other  organs which could require additional surgery, possibility of deep venous  thrombosis, anesthetic related complications. The patient voiced an  understanding of all these risks and desires to proceed. Informed consented  was obtained before proceeding to the OR.   PROCEDURE:  The patient was taken to the operating room where  her epidural  was tested and found to be adequate.  A Foley catheter had already been  placed.  She was prepped and draped in usual sterile fashion with Betadine.  10 mL of 0.25% plain Marcaine were then injected into the area with skin  incisions to be made and a Pfannenstiel skin incision was made with a  scalpel.  This was carried down sharply to the fascia.  The fascia was then  incised in the midline and a fascial incision was extended laterally. The  superior and inferior aspect of the fascial incision were then grasped with  Kocher clamps, elevated and the underlying rectus muscles were dissected off  with both sharp and blunt dissection.  The muscles were then dissected in  the midline, the peritoneum was identified and entered bluntly.  The  peritoneal incision was then extended with good visualization of the  bladder.  The bladder blade was then inserted and the vesicouterine  peritoneum was identified, grasped with pickups and entered sharply with  Metzenbaum scissors.  This incision was then extended laterally and the  bladder flap created  digitally.   The bladder blade was then reinserted and the lower uterine segment was  incised in a transverse fashion with the scalpel. A hand was then inserted  into the incision, the bladder blade was removed and the infant's head  delivered atraumatically.  The nose and mouth were suctioned with the bulb.  The infant was then delivered to the sterile field with a vigorous cry.  The  umbilical cord was then clamped and cut and the infant was handed off to the  waiting NICU attendants with the vigorous cry. There was noted to be a true  knot in the umbilical cord, arterial cord pH was obtained and was 7.35.  Cord blood was obtained as well.  The placenta was then removed  spontaneously and sent to labor and delivery. The uterus was then cleared of  all clot and debris and the uterine incision was repaired with 1-0 chromic  in a running  locked fashion.  A second layer of the same suture was then  placed in an imbricating fashion for additional hemostasis and in the event  that the patient would attempt a vaginal delivery in the future. Once the  uterine incision was then hemostatic, the adnexa were inspected and were  normal.  The pelvis was then irrigated copiously with warm normal saline.  The peritoneal surfaces, uterine surface and muscle surfaces were all  inspected and were hemostatic.  The bladder flap was hemostatic.  The fascia  was then closed with a running Vicryl suture.  The subcutaneous space was  then irrigated and the skin was then closed with staples.   The patient tolerated the procedure very well. All sponge, lap, needle and  instrument counts were correct x2.  The patient was taken to the recovery  room awake and in stable condition and there were no complications. The  infant is in the well baby nursery doing fine.      Richardean Sale, M.D.  Electronically Signed     JW/MEDQ  D:  04/12/2006  T:  04/13/2006  Job:  [id number]

## 2011-03-10 NOTE — Op Note (Signed)
NAME:  Danielle Harrington, Danielle Harrington                        ACCOUNT NO.:  [id number]   MEDICAL RECORD NO.:  [id number]                   PATIENT TYPE:  AMB   LOCATION:  SDC                                  [facility]ACILITY:  [facility]   PHYSICIAN:  [place] B. Earlene Plater, M.D.               DATE OF BIRTH:  Feb 26, 1976   DATE OF PROCEDURE:  DATE OF DISCHARGE:  05/16/2002                                 OPERATIVE REPORT   PREOPERATIVE DIAGNOSES:  History of endometriosis, pelvic pain, right lower  quadrant pain.   POSTOPERATIVE DIAGNOSES:  History of endometriosis, pelvic pain, right lower  quadrant pain.   OPERATION PERFORMED:  Open diagnostic laparoscopy, lysis of adhesions.   SURGEON:  Chester Holstein. Earlene Plater, M.D.   ANESTHESIA:  General.   FINDINGS:  Adhesions from the appendiceal stump to the right pelvis.  Adhesions from the omentum to the right pelvic sidewall.  Adhesions from the  small bowel to the right pelvic sidewall.  Normal-appearing uterus, tubes,  ovaries, cul-de-sac, bladder flap and ovarian fossa bilaterally.  Normal-  appearing upper abdomen.   INDICATIONS FOR PROCEDURE:  Patient with a history of endometriosis and  history of pelvic pain worst in the right lower quadrant.  Previous response  to Depo Lupron; however, pain has subsequently recurred about one year after  initiation of the Lupron therapy.  The patient has been using narcotic pain  medications to manage pain and presents for definitive surgical diagnosis  and potential treatment for her pain.   ESTIMATED BLOOD LOSS:  Less than 50 cc.   COMPLICATIONS:  None.   DESCRIPTION OF PROCEDURE:  The patient was taken to the operating room and  general anesthesia obtained.  She was placed in the ski position and  maintained under anesthesia.  She was found to have a normal-sized  anteverted uterus.  No adnexal mass was palpable.  She was then prepped and  draped in standard fashion.  A Foley catheter was inserted into the vagina.  A single  toothed tenaculum was attached to the anterior lip of the cervix.  The Hulka tenaculum was too long to attach to the anterior lip of the  cervix.  Therefore a uterine sound was inserted to the fundus and attached  to the single toothed tenaculum with a Ray-Tec.   Gowns and gloves were changed and attention turned to the abdomen.  A 10 mm  vertical infraumbilical skin fold incision was made with a knife.  The  dissection was carried sharply to the underlying fascia.  The fascia was  divided sharply with a knife and the fascial edges elevated with Kocher  clamps.  The posterior sheath and peritoneum were divided sharply with a  knife.  A purse-string suture of 0 Vicryl was placed around the fascial  defect, Hasson cannula inserted and secured.  Intra-abdominal placement was  confirmed with a laparoscope and pneumoperitoneum obtained with CO2 gas.   A  5 mm port was placed approximately 6 cm off the midline to the left and 2  cm above the symphysis under direct laparoscopic visualization.   The patient was placed in Trendelenburg position and bowel mobilized  superiorly with a blunt probe and the pelvis inspected.  The uterus, tubes  ovaries, cul-de-sacs anteriorly and posteriorly and bilateral ovarian fossa  all appeared normal.  No scarring and no evidence of recurrent  endometriosis.  The right lower quadrant of the abdomen was then examined  with a camera and the above adhesions noted.  The patient was status post  previous laparoscopic appendectomy.  Given the only abnormalities were the  adhesions, I felt this might be contributing to the patient's pain and  decided to proceed with lysis of adhesions.   The right lower quadrant was inspected with a laparoscope and two areas  looked like they could be repaired with the laparoscope.  The first was the  appendiceal stump.  A single dense adhesion was noted from it at its tip to  the right pelvic sidewall.  This was divided sharply.  This  released the  appendiceal stump.  The site of dissection was hemostatic.  The other area  that could be approached laparoscopically was omental adhesions to the right  pelvic side wall and butter.  These were about 5 to 6 cm in length.  The  adhesions were quite filmy.  They were first cauterized with bipolar cautery  and divided sharply.  After the above adhesions were freed, one area of  small bowel that was adhered to the right pelvic sidewall was noted.  The  length was approximately 3 to 4 cm  and I did not feel comfortable  dissecting the small bowel adhesions.  Therefore this was left undisturbed.  There did not appear to be any risk for destruction as the bowel around it  was fairly mobile and there was no kink in this portion of the small bowel.  The abdominal wall was visualized where the adhesions had been taken down.  A few small bleeders were noted and were made hemostatic with bipolar  cautery.  As no other abnormalities were noted, the procedure was  terminated.  The left lower quadrant port was removed under direct  laparoscopic visualization and the site was hemostatic.  The scope was  removed, gas released and Hasson cannula removed.  The pursestring suture  was snugged down.  I could insert an index finger through the defect.  The  suture was tied down and this obliterated the fascial defect.  No intra-  abdominal contents herniated through prior to closure.  The skin was closed  at each site with running 4-0 subcuticular suture.  The patient tolerated  the procedure well.  There were no complications.  The patient was then  transferred to the recovery room, awake, alert and in stable condition.  All  counts were correct.                                                   Gerri Spore B. Earlene Plater, M.D.    WBD/MEDQ  D:  05/16/2002  T:  05/22/2002  Job:  [id number]

## 2011-03-10 NOTE — H&P (Signed)
[facility]  Patient:    Danielle Harrington, Danielle Harrington Visit Number: [id number] MRN: [id number]          Service Type: DSU Location: [facility] Attending Physician:  Ermalene Searing Dictated by:   Marina Gravel, M.D. Admit Date:  05/16/2002                           History and Physical  PREOPERATIVE DIAGNOSES:       1. History of endometriosis.                               2. Recurrent pelvic pain.  INTENDED PROCEDURE:           1. Open laparoscopy.                               2. Potential laser ablation of endometriosis.  HISTORY OF PRESENT ILLNESS:   A 35 year old white female, gravida 0, with history of endometriosis, status post Lupron Depot for six months, with previous good control of pain; however, pain has now returned.  Had mild osteopenia on Lupron Depot, subsequently tried to be managed with continuous OCPs which failed and subsequently with monthly Depo-Provera, which also did not improve her pain.  She is currently requiring narcotic pain medication to control the pain and desires surgical therapy.  Has had relief in the past from laparoscopy.  Was noted to have laparoscopy of the left ovarian fossa, cul-de-sac, and right ovary September 20, 2000, with last laparoscopy.  PAST MEDICAL HISTORY:         Endometriosis, osteopenia, and kidney stones.  PAST SURGICAL HISTORY:        Laparoscopy x2, appendectomy.  MEDICATIONS:                  Zoloft 50 mg, Zyrtec, Depo-Provera, Aleve, and Vicodin.  ALLERGIES:                    None.  SOCIAL HISTORY:               One-half pack a day smoker.  No alcohol or other drugs.  FAMILY HISTORY:               Heart disease, hypertension, and a sister with breast cancer at age 27 and maternal grandmother with breast cancer.  REVIEW OF SYSTEMS:            Otherwise noncontributory.  PHYSICAL EXAMINATION:  VITAL SIGNS:                  Blood pressure 126/80, pulse 80.  Weight 145.  GENERAL:                       Alert and oriented, in no acute distress .  SKIN:                         Warm and dry with no lesions.  CHEST:                        Lungs clear to auscultation.  CARDIAC:                      Regular rate and rhythm.  ABDOMEN:  Previous laparoscopy scars noted.  Liver and spleen normal.  No hernia.  LYMPHATIC:                    Lymph node survey negative at the neck, axillae, and groin.  PELVIC:                       Normal external genitalia.  Vagina and cervix normal.  Uterus is retroverted, normal size, nontender.  No adnexal masses or tenderness.  ASSESSMENT:                   1. Recurrent pelvic pain.                               2. History of endometriosis, suspicious for                                  recurrent endometriosis.  PLAN:                         Open laparoscopy, potential laser ablation of endometriosis if identified.  Operative risks discussed, including infection, bleeding, damage to bowel, bladder, or surrounding organs.  All questions answered.  The patient wished to proceed. Dictated by:   Marina Gravel, M.D. Attending Physician:  Marina Gravel B DD:  05/14/02 TD:  05/16/02 Job: [id number]

## 2011-03-10 NOTE — Procedures (Signed)
Danielle Harrington, Danielle Harrington              ACCOUNT NO.:  [id number]   MEDICAL RECORD NO.:  [id number]          PATIENT TYPE:  REC   LOCATION:  TPC                          FACILITY:  [facility]   PHYSICIAN:  Erick Colace, M.D.DATE OF BIRTH:  11-Aug-1976   DATE OF PROCEDURE:  DATE OF DISCHARGE:                               OPERATIVE REPORT   A 35 year old female seen by me initially for acupuncture, no particular  improvement. Previous improvement with facet injections L4-5 level.   PROCEDURE:  Lumbar medial branch blocks at L3-L4 and right L5 dorsal  ramus injection under fluoroscopic guidance.   Informed consent was obtained after describing the risks and benefits of  the procedure to the patient.  These include bleeding, bruising,  infection, loss of bowel or bladder function, temporary or permanent  paralysis.  She elected to proceed and has given written  consent. Her  pain is only partially responsive to narcotic analgesic medications.   The patient was placed prone on the fluoroscopy table, Betadine prep,  sterile drape. A 25-gauge inch and a half needle was used to anesthetize  the skin and subcu tissue with 1% lidocaine x2 mL at each of the 3 sites  and a 22-gauge 3-1/2 inch spinal needle was inserted under fluoroscopic  guidance into to the junction of right S1 SAP sacral ala junction.  Bone  contact made and confirmed with lateral imaging.  Omnipaque 180 x 0.5 mL  demonstrated no intravascular uptake and 0.5 mL of Depo-Medrol lidocaine  solution was injected in the right L5 SAP transverse process junction.  Targeted bone contact made and confirmed with lateral imaging.  Omnipaque 180 x 0.5 mL demonstrated no intravascular uptake x 0.5 mL.  The Depo-Medrol lidocaine solution was injected in the right L4, SAP  transverse process junction targeted, bone contact made, confirmed with  lateral imaging.  Omnipaque 180 x 0.5 mL demonstrated no intravascular  take and 0.5 mL of  Depo-Medrol lidocaine solution was injected.  The  patient tolerated the procedure well.  Pre and post injection vitals  stable.  Pre-injection pain level 9/10, post injection pain level 2/10.  She will return in 3 weeks. I have given her a prescription for Ultram  ER, hydrocodone 7.5 #50 and Lidoderm patch.      Erick Colace, M.D.  Electronically Signed     AEK/MEDQ  D:  01/07/2007 15:01:27  T:  01/08/2007 08:11:37  Job:  [id number]

## 2011-03-10 NOTE — Procedures (Signed)
Danielle Harrington, Danielle Harrington              ACCOUNT NO.:  [id number]   MEDICAL RECORD NO.:  [id number]          PATIENT TYPE:  REC   LOCATION:  TPC                          FACILITY:  [facility]   PHYSICIAN:  Erick Colace, M.D.DATE OF BIRTH:  1976-10-07   DATE OF PROCEDURE:  12/11/2006  DATE OF DISCHARGE:                               OPERATIVE REPORT   The patient feels like she has benefitted from the acupuncture  treatment.  Pain average 7/10.  This is visit #6.   Her pain is no longer going down as far as her knee, it basically ends  at the gluteal crease.  It is mainly right-sided.   Treatment today consists of bilateral needles placed BL21, BL23 as well  as right BL25, BL27, right GB26, right GB30 and right BL36.  Electrical  stimulation between the points 4 hertz x 30 minutes.  The patient  tolerated the procedure well.  She is to return next week.  I have  refilled her hydrocodone 7.5/500, 50 tablets, 1 month supply.  She  showed no signs of aberrant drug behavior exceeding recommended dosages.      Erick Colace, M.D.  Electronically Signed     AEK/MEDQ  D:  12/11/2006 13:09:13  T:  12/11/2006 17:22:06  Job:  [id number]   cc:   Caralyn Guile. Ethelene Hal, M.D.  Fax: [phone]   Gabriel Earing, M.D.  Fax: [phone]

## 2011-03-26 ENCOUNTER — Emergency Department (HOSPITAL_COMMUNITY)
Admission: EM | Admit: 2011-03-26 | Discharge: 2011-03-27 | Disposition: A | Discharge status: Home / Self Care | Payer: BC Managed Care – PPO | Attending: Emergency Medicine | Admitting: Emergency Medicine

## 2011-03-26 DIAGNOSIS — Z79899 Other long term (current) drug therapy: Secondary | ICD-10-CM | POA: Insufficient documentation

## 2011-03-26 DIAGNOSIS — A0472 Enterocolitis due to Clostridium difficile, not specified as recurrent: Secondary | ICD-10-CM | POA: Insufficient documentation

## 2011-03-26 DIAGNOSIS — R5383 Other fatigue: Secondary | ICD-10-CM | POA: Insufficient documentation

## 2011-03-26 DIAGNOSIS — R259 Unspecified abnormal involuntary movements: Secondary | ICD-10-CM | POA: Insufficient documentation

## 2011-03-26 DIAGNOSIS — G8929 Other chronic pain: Secondary | ICD-10-CM | POA: Insufficient documentation

## 2011-03-26 DIAGNOSIS — R197 Diarrhea, unspecified: Secondary | ICD-10-CM | POA: Insufficient documentation

## 2011-03-26 DIAGNOSIS — M549 Dorsalgia, unspecified: Secondary | ICD-10-CM | POA: Insufficient documentation

## 2011-03-26 DIAGNOSIS — F1110 Opioid abuse, uncomplicated: Secondary | ICD-10-CM | POA: Insufficient documentation

## 2011-03-26 DIAGNOSIS — R5381 Other malaise: Secondary | ICD-10-CM | POA: Insufficient documentation

## 2011-03-26 DIAGNOSIS — IMO0001 Reserved for inherently not codable concepts without codable children: Secondary | ICD-10-CM | POA: Insufficient documentation

## 2011-03-26 DIAGNOSIS — R110 Nausea: Secondary | ICD-10-CM | POA: Insufficient documentation

## 2011-03-26 LAB — CBC
HCT: 46.1 % — ABNORMAL HIGH (ref 36.0–46.0)
MCH: 30.5 pg (ref 26.0–34.0)
MCV: 88.5 fL (ref 78.0–100.0)
RBC: 5.21 MIL/uL — ABNORMAL HIGH (ref 3.87–5.11)
WBC: 9.6 10*3/uL (ref 4.0–10.5)

## 2011-03-26 LAB — COMPREHENSIVE METABOLIC PANEL
ALT: 18 U/L (ref 0–35)
Alkaline Phosphatase: 90 U/L (ref 39–117)
CO2: 25 mEq/L (ref 19–32)
Chloride: 102 mEq/L (ref 96–112)
GFR calc non Af Amer: >60 mL/min (ref >60)
Glucose, Bld: 92 mg/dL (ref 70–99)
Potassium: 4.3 mEq/L (ref 3.5–5.1)
Sodium: 138 mEq/L (ref 135–145)
Total Bilirubin: 0.4 mg/dL (ref 0.3–1.2)
Total Protein: 8 g/dL (ref 6.0–8.3)

## 2011-03-26 LAB — DIFFERENTIAL
Lymphocytes Relative: 22 % (ref 12–46)
Lymphs Abs: 2.1 10*3/uL (ref 0.7–4.0)
Monocytes Relative: 6 % (ref 3–12)
Neutrophils Relative %: 71 % (ref 43–77)

## 2011-03-26 LAB — RAPID URINE DRUG SCREEN, HOSP PERFORMED
Cocaine: NOT DETECTED
Tetrahydrocannabinol: NOT DETECTED

## 2011-03-26 LAB — ETHANOL: Alcohol, Ethyl (B): <11 mg/dL — ABNORMAL HIGH (ref 0–10)

## 2011-03-27 ENCOUNTER — Inpatient Hospital Stay (HOSPITAL_COMMUNITY)
Admission: AD | Admit: 2011-03-27 | Discharge: 2011-03-31 | DRG: 744 | Disposition: A | Discharge status: Home / Self Care | Payer: BC Managed Care – PPO | Source: Ambulatory Visit | Attending: Psychiatry | Admitting: Psychiatry

## 2011-03-27 DIAGNOSIS — F329 Major depressive disorder, single episode, unspecified: Secondary | ICD-10-CM

## 2011-03-27 DIAGNOSIS — F1120 Opioid dependence, uncomplicated: Secondary | ICD-10-CM

## 2011-03-27 DIAGNOSIS — M549 Dorsalgia, unspecified: Secondary | ICD-10-CM

## 2011-03-27 DIAGNOSIS — Z560 Unemployment, unspecified: Secondary | ICD-10-CM

## 2011-03-27 DIAGNOSIS — A0472 Enterocolitis due to Clostridium difficile, not specified as recurrent: Secondary | ICD-10-CM

## 2011-03-27 DIAGNOSIS — F339 Major depressive disorder, recurrent, unspecified: Secondary | ICD-10-CM

## 2011-03-27 LAB — CLOSTRIDIUM DIFFICILE BY PCR

## 2011-03-29 NOTE — H&P (Signed)
NAMENEHAL, Danielle NO.:  [id number]  MEDICAL RECORD NO.:  [id number]  LOCATION:  0304                          FACILITY:  [facility]  PHYSICIAN:  Franchot Gallo, MD     DATE OF BIRTH:  August 04, 1976  DATE OF ADMISSION:  03/27/2011 DATE OF DISCHARGE:                      PSYCHIATRIC ADMISSION ASSESSMENT   IDENTIFYING INFORMATION:  This is a 35 year old single female admitted on March 27, 2011.  HISTORY OF PRESENT ILLNESS:  The patient is here trying to detox off her opiates.  She initially was hoping to be tapered off her medications as she received an epidural that helped with her back pain, and this was to be done by her primary care provider, but she states he lost his license and she was without medications and began to experience withdrawal symptoms.  She was taking 30 mg oxycodone q.4h., receiving a prescription up to #300 per month and was on 40 mg Opana b.i.d.  Her last use of opiates was on Thursday.  She denies any other substances. She denies any suicidal or homicidal thoughts.  Her sleep has been decreased and her appetite has been fair.  The patient currently has been experiencing diarrhea and was diagnosed with C-diff in the emergency room.  PAST PSYCHIATRIC HISTORY:  First admission to [facility]. No other psychiatric admissions.  SOCIAL HISTORY:  A 35 year old married female, married for ten years. She resides in [place].  She has a 84-year-old son.  She is unemployed.  FAMILY HISTORY:  None.  ALCOHOL AND DRUG HISTORY:  She denies any alcohol or substance use.  PRIMARY CARE PHYSICIAN:  Dr. Teola Bradley whom she states currently has lost his license.  MEDICAL PROBLEMS: 1. History of back pain. 2. Currently C-diff.  MEDICATIONS: 1. Has been on Flexeril 10 mg t.i.d. 2. Cymbalta 60 mg, two daily at bedtime. 3. Alprazolam 1 mg t.i.d. 4. Strattera 80 mg, one daily. 5. Birth control pills. 6. At home was taking Opana ER and  Roxicodone.  DRUG ALLERGIES:  NO KNOWN ALLERGIES.  PHYSICAL EXAMINATION:  A normally-developed female assessed at [facility] Emergency Department. She is complaining of some diarrhea this morning.  Hemoglobin 15.9, hematocrit 46.1, acetaminophen level less than 15, alcohol level less than 11.  Urine pregnancy test is negative. Urine drug screen is positive for benzodiazepines.  Her CMP is within normal limits.  She received a dose of clonidine in the emergency room, Ativan and was started on Flagyl.  MENTAL STATUS EXAM:  The patient is in bed.  She is a cooperative, good eye contact, stating that she feels pretty well.  Speech is clear, normal pace and tone.  The patient's mood is neutral.  She is, again, pleasant and offers a good history.  Thought processes are coherent, goal-directed, no evidence of any psychotic symptoms.  Denies any suicidal or homicidal thoughts.  Cognitive function intact.  Her memory appears intact.  Judgment and insight are good.  DIAGNOSES:  AXIS I:  Opiate dependence. AXIS II:  None. AXIS III:  History of back pain and Clostridium difficile. AXIS IV:  Medical problems. AXIS V:  Global Assessment of Functioning is 40.  PLAN: 1. Our plan is to place the patient  on the Librium and the clonidine     protocol. 2. The patient will need to follow up with her primary care provider. 3. Will continue with her Cymbalta.  TENTATIVE LENGTH OF STAY:  Two to three days.     Landry Corporal, N.P.   ______________________________ Franchot Gallo, MD    JO/MEDQ  D:  03/28/2011  T:  03/28/2011  Job:  [id number]  Electronically Signed by Limmie PatriciaP. on 03/29/2011 05:17:30 PM Electronically Signed by Franchot Gallo MD on 03/29/2011 05:23:12 PM

## 2011-03-31 LAB — STOOL CULTURE

## 2011-04-03 NOTE — Discharge Summary (Signed)
Danielle Harrington, Danielle Harrington              ACCOUNT NO.:  [id number]  MEDICAL RECORD NO.:  [id number]  LOCATION:  0304                          FACILITY:  [facility]  PHYSICIAN:  Franchot Gallo, MD     DATE OF BIRTH:  March 23, 1976  DATE OF ADMISSION:  03/27/2011 DATE OF DISCHARGE:  03/31/2011                              DISCHARGE SUMMARY   REASON FOR ADMISSION:  This is a 35 year old female who is here to get off her opiates.  She was initially hoping to be tapered off her medicine as she received an epidural to help with her back pain, but she states that her primary care provider lost his license and she was off of medications and began to experience withdrawal symptoms.  She denied any suicidal thoughts and was diagnosed with C diff in the emergency department.  FINAL IMPRESSION:  Axis I:  Major depressive disorder.  Opiate dependence. Axis II:  None. Axis III:  History of back pain and Clostridium difficile. Axis IV:  Medical problems. Axis V:  Current is 55-60.  PERTINENT LABORATORIES:  Urine drug screen positive for benzodiazepines. CMP was within normal limits.  Hemoglobin of 15.9, hematocrit of 46.1. Acetaminophen level less than 15.  SIGNIFICANT FINDINGS:  The patient is in the bed.  Cooperative, good eye contact.  States that she feels pretty well.  Speech is clear, normal pace and tone.  She was admitted to the adult milieu and started on both the Librium and clonidine protocol.  We discontinued her benzodiazepine and continued with her Cymbalta and her Flagyl that was ordered for her history of C diff.  The patient was placed on contact precautions.  The patient began to experience withdrawal symptoms, having chills and sweats.  Her diarrhea was subsiding.  She was able to eat but not sleeping very well.  Her Vistaril was not effective for sleep.  She was hoping to go to [facility] because she stated she has a history of addiction.  We discontinued her Vistaril and changed  her times of her Cymbalta rather than her 120 mg at bedtime, and had Benadryl for sleep. On June 7th, her sleep was good, appetite was good.  Having mild depressive symptoms, rating it on a scale of 4 to 7.  Denied any suicidal or homicidal thoughts or psychotic symptoms.  We also restarted her Strattera that she was using for her history of ADD.  We repeated the patient's stool specimen as the patient was very anxious, wanting to go home, which came back negative.  On day of discharge, the patient was feeling much better and wanted to be discharged today.  Her sleep was decreased.  Appetite was good.  Having mild depressive symptoms, rating it a 3 on a scale of 1-10.  Adamantly denied any suicidal or homicidal thoughts or auditory hallucinations, having no withdrawal symptoms and no medication side effects.  As stated, her stool culture was negative for C diff, and the patient was stable for discharge.  Her discharge medications included: 1. Cymbalta 60 mg 1 b.i.d. 2. Flagyl 1 tablet q.8 h for a course of 10 days.  The patient had 5     more days remaining.  3. Folic acid 1 mg daily. 4. Strattera 80 mg daily. 5. Vitamin B12 daily. 6. Her birth control pill.  The patient was to stop taking her Opana, Roxicodone and Xanax.  Her followup appointment was with [facility] on June 9th at 3:00 p.m., phone number [phone].     Landry Corporal, N.P.   ______________________________ Franchot Gallo, MD    JO/MEDQ  D:  03/31/2011  T:  03/31/2011  Job:  [id number]  Electronically Signed by Limmie PatriciaP. on 04/03/2011 09:22:40 AM Electronically Signed by Franchot Gallo MD on 04/03/2011 05:05:37 PM

## 2012-03-22 ENCOUNTER — Other Ambulatory Visit: Payer: Self-pay | Admitting: Obstetrics

## 2012-03-22 DIAGNOSIS — N6310 Unspecified lump in the right breast, unspecified quadrant: Secondary | ICD-10-CM

## 2012-03-29 ENCOUNTER — Other Ambulatory Visit: Payer: Self-pay | Admitting: Obstetrics

## 2012-03-29 ENCOUNTER — Ambulatory Visit
Admission: RE | Admit: 2012-03-29 | Discharge: 2012-03-29 | Disposition: A | Discharge status: Home / Self Care | Payer: BC Managed Care – PPO | Source: Ambulatory Visit | Attending: Obstetrics | Admitting: Obstetrics

## 2012-03-29 DIAGNOSIS — N6310 Unspecified lump in the right breast, unspecified quadrant: Secondary | ICD-10-CM

## 2013-12-26 ENCOUNTER — Other Ambulatory Visit: Payer: Self-pay | Admitting: Otolaryngology

## 2013-12-26 DIAGNOSIS — H9190 Unspecified hearing loss, unspecified ear: Secondary | ICD-10-CM

## 2013-12-26 DIAGNOSIS — H9319 Tinnitus, unspecified ear: Secondary | ICD-10-CM

## 2013-12-30 ENCOUNTER — Ambulatory Visit
Admission: RE | Admit: 2013-12-30 | Discharge: 2013-12-30 | Disposition: A | Discharge status: Home / Self Care | Payer: BC Managed Care – PPO | Source: Ambulatory Visit | Attending: Otolaryngology | Admitting: Otolaryngology

## 2013-12-30 DIAGNOSIS — H9319 Tinnitus, unspecified ear: Secondary | ICD-10-CM

## 2013-12-30 DIAGNOSIS — H9190 Unspecified hearing loss, unspecified ear: Secondary | ICD-10-CM

## 2013-12-30 MED ORDER — GADOBENATE DIMEGLUMINE 529 MG/ML IV SOLN
12.0000 mL | Freq: Once | INTRAVENOUS | Status: AC | PRN
  Administered 2013-12-30: 12 mL via INTRAVENOUS

## 2015-02-24 ENCOUNTER — Emergency Department (HOSPITAL_COMMUNITY): Payer: 59

## 2015-02-24 ENCOUNTER — Encounter (HOSPITAL_COMMUNITY): Payer: Self-pay | Admitting: Emergency Medicine

## 2015-02-24 ENCOUNTER — Emergency Department (HOSPITAL_COMMUNITY)
Admission: EM | Admit: 2015-02-24 | Discharge: 2015-02-24 | Disposition: A | Discharge status: Home / Self Care | Payer: 59 | Attending: Emergency Medicine | Admitting: Emergency Medicine

## 2015-02-24 DIAGNOSIS — Z791 Long term (current) use of non-steroidal anti-inflammatories (NSAID): Secondary | ICD-10-CM | POA: Diagnosis not present

## 2015-02-24 DIAGNOSIS — R109 Unspecified abdominal pain: Secondary | ICD-10-CM | POA: Diagnosis not present

## 2015-02-24 DIAGNOSIS — F909 Attention-deficit hyperactivity disorder, unspecified type: Secondary | ICD-10-CM | POA: Insufficient documentation

## 2015-02-24 DIAGNOSIS — R319 Hematuria, unspecified: Secondary | ICD-10-CM

## 2015-02-24 DIAGNOSIS — R110 Nausea: Secondary | ICD-10-CM | POA: Insufficient documentation

## 2015-02-24 DIAGNOSIS — Z79899 Other long term (current) drug therapy: Secondary | ICD-10-CM | POA: Diagnosis not present

## 2015-02-24 DIAGNOSIS — F329 Major depressive disorder, single episode, unspecified: Secondary | ICD-10-CM | POA: Insufficient documentation

## 2015-02-24 DIAGNOSIS — I10 Essential (primary) hypertension: Secondary | ICD-10-CM | POA: Insufficient documentation

## 2015-02-24 DIAGNOSIS — Z9089 Acquired absence of other organs: Secondary | ICD-10-CM | POA: Diagnosis not present

## 2015-02-24 DIAGNOSIS — Z3202 Encounter for pregnancy test, result negative: Secondary | ICD-10-CM | POA: Insufficient documentation

## 2015-02-24 DIAGNOSIS — Z720 Tobacco use: Secondary | ICD-10-CM | POA: Insufficient documentation

## 2015-02-24 HISTORY — DX: Depression, unspecified: F32.A

## 2015-02-24 HISTORY — DX: Other specified behavioral and emotional disorders with onset usually occurring in childhood and adolescence: F98.8

## 2015-02-24 HISTORY — DX: Major depressive disorder, single episode, unspecified: F32.9

## 2015-02-24 HISTORY — DX: Essential (primary) hypertension: I10

## 2015-02-24 LAB — URINALYSIS, ROUTINE W REFLEX MICROSCOPIC
BILIRUBIN URINE: NEGATIVE
Glucose, UA: NEGATIVE mg/dL
Ketones, ur: NEGATIVE mg/dL
NITRITE: NEGATIVE
PH: 6.5 (ref 5.0–8.0)
Protein, ur: NEGATIVE mg/dL
Specific Gravity, Urine: 1.028 (ref 1.005–1.030)
UROBILINOGEN UA: 0.2 mg/dL (ref 0.0–1.0)

## 2015-02-24 LAB — I-STAT CHEM 8, ED
BUN: 14 mg/dL (ref 6–20)
CHLORIDE: 101 mmol/L (ref 101–111)
CREATININE: 0.6 mg/dL (ref 0.44–1.00)
Calcium, Ion: 1.17 mmol/L (ref 1.12–1.23)
GLUCOSE: 115 mg/dL — AB (ref 70–99)
HCT: 38 % (ref 36.0–46.0)
Hemoglobin: 12.9 g/dL (ref 12.0–15.0)
Potassium: 3.6 mmol/L (ref 3.5–5.1)
SODIUM: 139 mmol/L (ref 135–145)
TCO2: 23 mmol/L (ref 0–100)

## 2015-02-24 LAB — POC URINE PREG, ED: PREG TEST UR: NEGATIVE

## 2015-02-24 LAB — URINE MICROSCOPIC-ADD ON

## 2015-02-24 MED ORDER — ONDANSETRON 4 MG PO TBDP
4.0000 mg | ORAL_TABLET | Freq: Once | ORAL | Status: DC
Start: 2015-02-24 — End: 2015-02-24

## 2015-02-24 MED ORDER — ONDANSETRON HCL 4 MG/2ML IJ SOLN
4.0000 mg | Freq: Once | INTRAMUSCULAR | Status: AC
  Administered 2015-02-24: 4 mg via INTRAVENOUS
  Filled 2015-02-24: qty 2

## 2015-02-24 MED ORDER — MORPHINE SULFATE 4 MG/ML IJ SOLN
4.0000 mg | Freq: Once | INTRAMUSCULAR | Status: AC
  Administered 2015-02-24: 4 mg via INTRAVENOUS
  Filled 2015-02-24: qty 1

## 2015-02-24 MED ORDER — KETOROLAC TROMETHAMINE 60 MG/2ML IM SOLN: 60.0000 mg | Freq: Once | INTRAMUSCULAR | Status: DC

## 2015-02-24 MED ORDER — HYDROMORPHONE HCL 1 MG/ML IJ SOLN
1.0000 mg | Freq: Once | INTRAMUSCULAR | Status: AC
  Administered 2015-02-24: 1 mg via INTRAVENOUS
  Filled 2015-02-24: qty 1

## 2015-02-24 MED ORDER — MORPHINE SULFATE 4 MG/ML IJ SOLN: 4.0000 mg | Freq: Once | INTRAMUSCULAR | Status: DC

## 2015-02-24 MED ORDER — SODIUM CHLORIDE 0.9 % IV BOLUS (SEPSIS)
1000.0000 mL | Freq: Once | INTRAVENOUS | Status: AC
  Administered 2015-02-24: 1000 mL via INTRAVENOUS

## 2015-02-24 MED ORDER — KETOROLAC TROMETHAMINE 30 MG/ML IJ SOLN
30.0000 mg | Freq: Once | INTRAMUSCULAR | Status: AC
  Administered 2015-02-24: 30 mg via INTRAVENOUS
  Filled 2015-02-24: qty 1

## 2015-02-24 NOTE — Progress Notes (Signed)
EDCM spoke to patient at bedside.  Patient reports her pcp is located at [facility] Dr. Nancy Fetter and also sees Dr. Mariea Clonts there as well.  System updated.  No further EDCM needs at this time.

## 2015-02-24 NOTE — Discharge Instructions (Signed)
Continue taking the medications previously prescribed. Follow-up with your primary care physician within one week.  Flank Pain Flank pain refers to pain that is located on the side of the body between the upper abdomen and the back. The pain may occur over a short period of time (acute) or may be long-term or reoccurring (chronic). It may be mild or severe. Flank pain can be caused by many things. CAUSES  Some of the more common causes of flank pain include:  Muscle strains.   Muscle spasms.   A disease of your spine (vertebral disk disease).   A lung infection (pneumonia).   Fluid around your lungs (pulmonary edema).   A kidney infection.   Kidney stones.   A very painful skin rash caused by the chickenpox virus (shingles).   Gallbladder disease.  [place] care will depend on the cause of your pain. In general,  Rest as directed by your caregiver.  Drink enough fluids to keep your urine clear or pale yellow.  Only take over-the-counter or prescription medicines as directed by your caregiver. Some medicines may help relieve the pain.  Tell your caregiver about any changes in your pain.  Follow up with your caregiver as directed. SEEK IMMEDIATE MEDICAL CARE IF:   Your pain is not controlled with medicine.   You have new or worsening symptoms.  Your pain increases.   You have abdominal pain.   You have shortness of breath.   You have persistent nausea or vomiting.   You have swelling in your abdomen.   You feel faint or pass out.   You have blood in your urine.  You have a fever or persistent symptoms for more than 2-3 days.  You have a fever and your symptoms suddenly get worse. MAKE SURE YOU:   Understand these instructions.  Will watch your condition.  Will get help right away if you are not doing well or get worse. Document Released: 11/30/2005 Document Revised: 07/03/2012 Document Reviewed: 05/23/2012 [facility]  Patient Information 2015 [place], [place]. This information is not intended to replace advice given to you by your health care provider. Make sure you discuss any questions you have with your health care provider.  Hematuria Hematuria is blood in your urine. It can be caused by a bladder infection, kidney infection, prostate infection, kidney stone, or cancer of your urinary tract. Infections can usually be treated with medicine, and a kidney stone usually will pass through your urine. If neither of these is the cause of your hematuria, further workup to find out the reason may be needed. It is very important that you tell your health care provider about any blood you see in your urine, even if the blood stops without treatment or happens without causing pain. Blood in your urine that happens and then stops and then happens again can be a symptom of a very serious condition. Also, pain is not a symptom in the initial stages of many urinary cancers. HOME CARE INSTRUCTIONS   Drink lots of fluid, 3-4 quarts a day. If you have been diagnosed with an infection, cranberry juice is especially recommended, in addition to large amounts of water.  Avoid caffeine, tea, and carbonated beverages because they tend to irritate the bladder.  Avoid alcohol because it may irritate the prostate.  Take all medicines as directed by your health care provider.  If you were prescribed an antibiotic medicine, finish it all even if you start to feel better.  If  you have been diagnosed with a kidney stone, follow your health care provider's instructions regarding straining your urine to catch the stone.  Empty your bladder often. Avoid holding urine for long periods of time.  After a bowel movement, women should cleanse front to back. Use each tissue only once.  Empty your bladder before and after sexual intercourse if you are a female. SEEK MEDICAL CARE IF:  You develop back pain.  You have a fever.  You have a  feeling of sickness in your stomach (nausea) or vomiting.  Your symptoms are not better in 3 days. Return sooner if you are getting worse. SEEK IMMEDIATE MEDICAL CARE IF:   You develop severe vomiting and are unable to keep the medicine down.  You develop severe back or abdominal pain despite taking your medicines.  You begin passing a large amount of blood or clots in your urine.  You feel extremely weak or faint, or you pass out. MAKE SURE YOU:   Understand these instructions.  Will watch your condition.  Will get help right away if you are not doing well or get worse. Document Released: 10/09/2005 Document Revised: 02/23/2014 Document Reviewed: 06/09/2013 [facility] Patient Information 2015 [place], [place]. This information is not intended to replace advice given to you by your health care provider. Make sure you discuss any questions you have with your health care provider.

## 2015-02-24 NOTE — ED Notes (Signed)
Pt states that she has had R sided flank pain x 4 days. Went to urgent care and had blood in her urinalysis. States she was sent home with zofran and pain meds but urologist cant see her till next week. BP elevated. Has been taking meds. Alert and oriented.

## 2015-02-24 NOTE — ED Provider Notes (Signed)
CSN: [id number]     Arrival date & time 02/24/15  1912 History   First MD Initiated Contact with Patient 02/24/15 1942     Chief Complaint  Patient presents with  . Flank Pain     (Consider location/radiation/quality/duration/timing/severity/associated sxs/prior Treatment) HPI Comments: 39 year old female complaining of right-sided flank pain 4 days. Pain sudden onset, beginning on the right side of her back, radiating around her flank to her suprapubic area. Pain has been  Constant, however eases off at random, with occasional sharp, intense pains. States this feels like a kidney stone she had 20 years back. Went to urgent care 4 days ago, and was told she had blood on her urinalysis and to follow-up with urology. She is unable to make a urology appointment until next week. States the pain has become more intense, unrelieved by Percocet. Endorses nausea without vomiting. No fevers.States her urine output is decreased when she normally urinates very frequently. Denies dysuria or noticeable hematuria. Denies vaginal bleeding or discharge. She does not get a menstrual period as she continuously takes her oral contraceptives to prevent this due to endometriosis.  Patient is a 39 y.o. female presenting with flank pain. The history is provided by the patient.  Flank Pain Associated symptoms include nausea.    Past Medical History  Diagnosis Date  . Hypertension   . ADD (attention deficit disorder)   . Depression    Past Surgical History  Procedure Laterality Date  . Cesarean section    . Appendectomy    . Tonsillectomy    . Knee surgery    . Laparoscopic endometriosis fulguration     No family history on file. History  Substance Use Topics  . Smoking status: Current Some Day Smoker  . Smokeless tobacco: Not on file  . Alcohol Use: Yes   OB History    No data available     Review of Systems  Gastrointestinal: Positive for nausea.  Genitourinary: Positive for flank pain and  difficulty urinating.  All other systems reviewed and are negative.     Allergies  Review of patient's allergies indicates no known allergies.  Home Medications   Prior to Admission medications   Medication Sig Start Date End Date Taking? Authorizing Provider  ALPRAZolam Duanne Moron) 0.5 MG tablet Take 0.5 mg by mouth 2 (two) times daily as needed for anxiety (anxiety).  12/22/14  Yes Historical Provider, MD  buPROPion (WELLBUTRIN XL) 300 MG 24 hr tablet Take 300 mg by mouth daily.  02/01/15  Yes Historical Provider, MD  citalopram (CELEXA) 20 MG tablet Take 20 mg by mouth at bedtime.  01/13/15  Yes Historical Provider, MD  HYDROcodone-acetaminophen (NORCO/VICODIN) 5-325 MG per tablet Take 1-2 tablets by mouth every 6 (six) hours as needed for moderate pain or severe pain (pain).   Yes Historical Provider, MD  ibuprofen (ADVIL,MOTRIN) 200 MG tablet Take 400 mg by mouth every 6 (six) hours as needed for moderate pain (pain).   Yes Historical Provider, MD  levocetirizine (XYZAL) 5 MG tablet Take 5 mg by mouth every evening. 01/08/15  Yes Historical Provider, MD  losartan-hydrochlorothiazide (HYZAAR) 100-12.5 MG per tablet Take 1 tablet by mouth daily. 01/08/15  Yes Historical Provider, MD  meloxicam (MOBIC) 15 MG tablet Take 15 mg by mouth daily as needed for pain (pain).  01/01/15  Yes Historical Provider, MD  STRATTERA 80 MG capsule Take 80 mg by mouth every morning. 01/01/15  Yes Historical Provider, MD  YASMIN 28 3-0.03 MG tablet Take 1  tablet by mouth daily.  01/05/15  Yes Historical Provider, MD  zolpidem (AMBIEN) 10 MG tablet Take 10 mg by mouth at bedtime. for sleep 01/11/15  Yes Historical Provider, MD   BP 146/93 mmHg  Pulse 86  Temp(Src) 98.2 F (36.8 C) (Oral)  Resp 16  SpO2 95% Physical Exam  Constitutional: She is oriented to person, place, and time. She appears well-developed and well-nourished. No distress.  HENT:  Head: Normocephalic and atraumatic.  Mouth/Throat: Oropharynx is  clear and moist.  Eyes: Conjunctivae and EOM are normal.  Neck: Normal range of motion. Neck supple.  Cardiovascular: Normal rate, regular rhythm and normal heart sounds.   Pulmonary/Chest: Effort normal and breath sounds normal. No respiratory distress.  Abdominal: Soft. Normal appearance and bowel sounds are normal. There is CVA tenderness (Right). There is no rigidity, no rebound and no guarding.    No peritoneal signs.  Musculoskeletal: Normal range of motion. She exhibits no edema.  Neurological: She is alert and oriented to person, place, and time. No sensory deficit.  Skin: Skin is warm and dry.  Psychiatric: She has a normal mood and affect. Her behavior is normal.  Nursing note and vitals reviewed.   ED Course  Procedures (including critical care time) Labs Review Labs Reviewed  URINALYSIS, ROUTINE W REFLEX MICROSCOPIC - Abnormal; Notable for the following:    APPearance CLOUDY (*)    Hgb urine dipstick TRACE (*)    Leukocytes, UA SMALL (*)    All other components within normal limits  URINE MICROSCOPIC-ADD ON - Abnormal; Notable for the following:    Squamous Epithelial / LPF MANY (*)    Bacteria, UA MANY (*)    All other components within normal limits  I-STAT CHEM 8, ED - Abnormal; Notable for the following:    Glucose, Bld 115 (*)    All other components within normal limits  POC URINE PREG, ED    Imaging Review Ct Renal Stone Study  02/24/2015   CLINICAL DATA:  Right flank pain for 4 days. Blood in urine. Previous appendectomy. Initial encounter.  EXAM: CT ABDOMEN AND PELVIS WITHOUT CONTRAST  TECHNIQUE: Multidetector CT imaging of the abdomen and pelvis was performed following the standard protocol without IV contrast.  COMPARISON:  CT 12/08/2003.  FINDINGS: Lower chest: Mild dependent atelectasis in both lungs. No pleural or pericardial effusion.  Hepatobiliary: As evaluated in the noncontrast state, the liver appears unremarkable. No evidence of gallstones,  gallbladder wall thickening or biliary dilatation.  Pancreas: Unremarkable. No pancreatic ductal dilatation or surrounding inflammatory changes.  Spleen: Normal in size without focal abnormality.  Adrenals/Urinary Tract: Both adrenal glands appear normal.The kidneys appear normal without evidence of urinary tract calculus, suspicious lesion or hydronephrosis. No bladder abnormalities are seen.  Stomach/Bowel: No evidence of bowel wall thickening, distention or surrounding inflammatory change.Ingest material noted in the stomach. There is moderate stool throughout the colon.  Vascular/Lymphatic: There are no enlarged abdominal or pelvic lymph nodes. No significant vascular findings seen on noncontrast imaging. Small pelvic phleboliths are similar to the prior study.  Reproductive: Unremarkable.  Other: Surgical clips are noted within the pelvis. No evidence of abdominal wall mass or hernia.  Musculoskeletal: No acute or significant osseous findings.  IMPRESSION: 1. No acute findings or explanation for the patient's symptoms. 2. No evidence of urinary tract calculus or hydronephrosis.   Electronically Signed   By: Richardean Sale M.D.   On: 02/24/2015 21:59     EKG Interpretation None  MDM   Final diagnoses:  Right flank pain  Hematuria   Nontoxic appearing, NAD. Afebrile. Hypotensive on arrival. Blood pressure improved on pain control. History of kidney stones about 20 years ago. Reports there was hematuria on UA from urgent care. She has right-sided CVA tenderness and tenderness around right flank and suprapubic.UA contaminated with epithelial cells, only small leukocytes and 3-6 white blood cells. Many bacteria. Culture pending given equivocal. Normal kidney functions. After receiving IV Toradol and morphine, pain slightly subsided. Still with CVA tenderness. CT renal stone study obtained to evaluate for obstructing stone, CT unremarkable. No acute findings. She does not have an appendix that  would raise concern for appendicitis. Reproductive system unremarkable. Discussed this with patient. It is possible that she had passed a kidney stone. I advised her to continue taking the pain medication prescribed to her prior, and to follow-up with her PCP. Stable for discharge. Return precautions given. Patient states understanding of treatment care plan and is agreeable.  Carman Ching, PA-C 02/24/15 2230  Daleen Bo, MD 02/25/15 [phone]

## 2015-02-26 LAB — URINE CULTURE

## 2016-02-15 ENCOUNTER — Other Ambulatory Visit (HOSPITAL_COMMUNITY): Payer: Self-pay | Admitting: Family

## 2016-02-16 ENCOUNTER — Other Ambulatory Visit (HOSPITAL_COMMUNITY): Payer: Self-pay | Admitting: *Deleted

## 2016-02-17 ENCOUNTER — Other Ambulatory Visit: Payer: Self-pay

## 2016-02-17 ENCOUNTER — Encounter (HOSPITAL_COMMUNITY): Payer: Self-pay

## 2016-02-17 ENCOUNTER — Encounter (HOSPITAL_COMMUNITY)
Admission: RE | Admit: 2016-02-17 | Discharge: 2016-02-17 | Disposition: A | Discharge status: Home / Self Care | Payer: 59 | Source: Ambulatory Visit | Attending: Orthopedic Surgery | Admitting: Orthopedic Surgery

## 2016-02-17 DIAGNOSIS — F17200 Nicotine dependence, unspecified, uncomplicated: Secondary | ICD-10-CM | POA: Diagnosis not present

## 2016-02-17 DIAGNOSIS — F329 Major depressive disorder, single episode, unspecified: Secondary | ICD-10-CM | POA: Diagnosis not present

## 2016-02-17 DIAGNOSIS — Z793 Long term (current) use of hormonal contraceptives: Secondary | ICD-10-CM | POA: Diagnosis not present

## 2016-02-17 DIAGNOSIS — I10 Essential (primary) hypertension: Secondary | ICD-10-CM | POA: Diagnosis not present

## 2016-02-17 DIAGNOSIS — Z791 Long term (current) use of non-steroidal anti-inflammatories (NSAID): Secondary | ICD-10-CM | POA: Diagnosis not present

## 2016-02-17 DIAGNOSIS — F419 Anxiety disorder, unspecified: Secondary | ICD-10-CM | POA: Diagnosis not present

## 2016-02-17 DIAGNOSIS — Z79899 Other long term (current) drug therapy: Secondary | ICD-10-CM | POA: Diagnosis not present

## 2016-02-17 DIAGNOSIS — S92242A Displaced fracture of medial cuneiform of left foot, initial encounter for closed fracture: Secondary | ICD-10-CM | POA: Diagnosis not present

## 2016-02-17 DIAGNOSIS — X58XXXA Exposure to other specified factors, initial encounter: Secondary | ICD-10-CM | POA: Diagnosis not present

## 2016-02-17 DIAGNOSIS — S92312A Displaced fracture of first metatarsal bone, left foot, initial encounter for closed fracture: Secondary | ICD-10-CM | POA: Diagnosis not present

## 2016-02-17 DIAGNOSIS — S92812A Other fracture of left foot, initial encounter for closed fracture: Secondary | ICD-10-CM | POA: Diagnosis present

## 2016-02-17 HISTORY — DX: Headache, unspecified: R51.9

## 2016-02-17 HISTORY — DX: Family history of other specified conditions: Z84.89

## 2016-02-17 HISTORY — DX: Headache: R51

## 2016-02-17 HISTORY — DX: Anxiety disorder, unspecified: F41.9

## 2016-02-17 HISTORY — DX: Adverse effect of unspecified anesthetic, initial encounter: T41.45XA

## 2016-02-17 HISTORY — DX: Other specified postprocedural states: R11.2

## 2016-02-17 HISTORY — DX: Other specified postprocedural states: Z98.890

## 2016-02-17 HISTORY — DX: Unspecified osteoarthritis, unspecified site: M19.90

## 2016-02-17 HISTORY — DX: Other complications of anesthesia, initial encounter: T88.59XA

## 2016-02-17 LAB — BASIC METABOLIC PANEL
ANION GAP: 8 (ref 5–15)
BUN: 9 mg/dL (ref 6–20)
CHLORIDE: 108 mmol/L (ref 101–111)
CO2: 25 mmol/L (ref 22–32)
Calcium: 9.3 mg/dL (ref 8.9–10.3)
Creatinine, Ser: 0.55 mg/dL (ref 0.44–1.00)
GFR calc non Af Amer: >60 mL/min (ref >60)
GLUCOSE: 95 mg/dL (ref 65–99)
Potassium: 4.2 mmol/L (ref 3.5–5.1)
Sodium: 141 mmol/L (ref 135–145)

## 2016-02-17 LAB — CBC
HEMATOCRIT: 36.2 % (ref 36.0–46.0)
HEMOGLOBIN: 11.8 g/dL — AB (ref 12.0–15.0)
MCH: 30.4 pg (ref 26.0–34.0)
MCHC: 32.6 g/dL (ref 30.0–36.0)
MCV: 93.3 fL (ref 78.0–100.0)
Platelets: 299 10*3/uL (ref 150–400)
RBC: 3.88 MIL/uL (ref 3.87–5.11)
RDW: 13.5 % (ref 11.5–15.5)
WBC: 5.2 10*3/uL (ref 4.0–10.5)

## 2016-02-17 LAB — HCG, SERUM, QUALITATIVE: Preg, Serum: NEGATIVE

## 2016-02-17 MED ORDER — CEFAZOLIN SODIUM-DEXTROSE 2-4 GM/100ML-% IV SOLN
2.0000 g | INTRAVENOUS | Status: AC
  Administered 2016-02-18: 2 g via INTRAVENOUS
  Filled 2016-02-17: qty 100

## 2016-02-17 NOTE — Progress Notes (Signed)
Followed by Dr. Alyson Ingles at [facility]. For PCP. Pt. Unsure when she had last ekg, perhaps with a surgical center.  Pt. Denies all chest complaints.

## 2016-02-17 NOTE — Pre-Procedure Instructions (Signed)
Danielle Harrington  02/17/2016      [id number] [place] [address]Phone:: [phone]:: [phone]   Your procedure is scheduled on 02/18/2016.  [facility]  Call this number if you have problems the morning of surgery:  [phone]   Remember:  Do not eat food or drink liquids after midnight.  On Thursday   Take these medicines the morning of surgery with A SIP OF WATER: Oxycodone, Alprazolam, Wellbutrin    Do not wear jewelry, make-up or nail polish.   Do not wear lotions, powders, or perfumes.  You may wear deodorant.    Do not shave 48 hours prior to surgery.    Do not bring valuables to the hospital.   [facility] or [facility].  Contacts, dentures or bridgework may not be worn into surgery.  Leave your suitcase in the car.  After surgery it may be brought to your room.  For patients admitted to the hospital, discharge time will be determined by your treatment team.  Patients discharged the day of surgery will not be allowed to drive home.   Name and phone number of your driver:   With [employer]  Special instructions:  [facility]  Before surgery, you can play an important role.  Because skin is not sterile, your skin needs to be as free of germs as possible.  You can reduce the number of germs on you skin by washing with CHG (chlorahexidine gluconate) soap before surgery.  CHG is an antiseptic cleaner which kills germs and bonds with the skin to continue killing germs even after washing.  Please DO NOT use if you have an allergy to CHG or antibacterial soaps.  If your skin becomes reddened/irritated stop using the CHG and inform your nurse when you arrive at Short Stay.  Do not shave (including legs and underarms) for at least 48 hours prior to the first CHG  shower.  You may shave your face.  Please follow these instructions carefully:   1.  Shower with CHG Soap the night before surgery and the  morning of Surgery.  2.  If you choose to wash your hair, wash your hair first as usual with your  normal shampoo.  3.  After you shampoo, rinse your hair and body thoroughly to remove the  Shampoo.  4.  Use CHG as you would any other liquid soap.  You can apply chg directly to the skin and wash gently with scrungie or a clean washcloth.  5.  Apply the CHG Soap to your body ONLY FROM THE NECK DOWN.    Do not use on open wounds or open sores.  Avoid contact with your eyes, ears, mouth and genitals (private parts).  Wash genitals (private parts)   with your normal soap.  6.  Wash thoroughly, paying special attention to the area where your surgery will be performed.  7.  Thoroughly rinse your body with warm water from the neck down.  8.  DO NOT shower/wash with your normal soap after using and rinsing off   the CHG Soap.  9.  Pat yourself dry with a clean towel.            10.  Wear clean pajamas.            11.  Place clean sheets on your bed the night of your first shower and do not sleep with pets.  Day of Surgery  Do not apply any lotions/deodorants the morning of surgery.  Please wear clean clothes to the hospital/surgery center.  Please read over the following fact sheets that you were given.: Pain Booklet, deep breathing exercies

## 2016-02-18 ENCOUNTER — Ambulatory Visit (HOSPITAL_COMMUNITY)
Admission: RE | Admit: 2016-02-18 | Discharge: 2016-02-18 | Disposition: A | Discharge status: Home / Self Care | Payer: 59 | Source: Ambulatory Visit | Attending: Orthopedic Surgery | Admitting: Orthopedic Surgery

## 2016-02-18 ENCOUNTER — Encounter (HOSPITAL_COMMUNITY)
Admission: RE | Disposition: A | Discharge status: Home / Self Care | Payer: Self-pay | Source: Ambulatory Visit | Attending: Orthopedic Surgery

## 2016-02-18 ENCOUNTER — Ambulatory Visit (HOSPITAL_COMMUNITY): Payer: 59 | Admitting: Certified Registered"

## 2016-02-18 ENCOUNTER — Encounter (HOSPITAL_COMMUNITY): Payer: Self-pay | Admitting: *Deleted

## 2016-02-18 DIAGNOSIS — S93325A Dislocation of tarsometatarsal joint of left foot, initial encounter: Secondary | ICD-10-CM

## 2016-02-18 DIAGNOSIS — Z793 Long term (current) use of hormonal contraceptives: Secondary | ICD-10-CM | POA: Insufficient documentation

## 2016-02-18 DIAGNOSIS — Z791 Long term (current) use of non-steroidal anti-inflammatories (NSAID): Secondary | ICD-10-CM | POA: Insufficient documentation

## 2016-02-18 DIAGNOSIS — S92242A Displaced fracture of medial cuneiform of left foot, initial encounter for closed fracture: Secondary | ICD-10-CM | POA: Diagnosis not present

## 2016-02-18 DIAGNOSIS — F329 Major depressive disorder, single episode, unspecified: Secondary | ICD-10-CM | POA: Insufficient documentation

## 2016-02-18 DIAGNOSIS — F17200 Nicotine dependence, unspecified, uncomplicated: Secondary | ICD-10-CM | POA: Insufficient documentation

## 2016-02-18 DIAGNOSIS — F419 Anxiety disorder, unspecified: Secondary | ICD-10-CM | POA: Insufficient documentation

## 2016-02-18 DIAGNOSIS — Z79899 Other long term (current) drug therapy: Secondary | ICD-10-CM | POA: Insufficient documentation

## 2016-02-18 DIAGNOSIS — S92312A Displaced fracture of first metatarsal bone, left foot, initial encounter for closed fracture: Secondary | ICD-10-CM | POA: Insufficient documentation

## 2016-02-18 DIAGNOSIS — I10 Essential (primary) hypertension: Secondary | ICD-10-CM | POA: Insufficient documentation

## 2016-02-18 DIAGNOSIS — X58XXXA Exposure to other specified factors, initial encounter: Secondary | ICD-10-CM | POA: Insufficient documentation

## 2016-02-18 HISTORY — PX: OPEN REDUCTION INTERNAL FIXATION (ORIF) FOOT LISFRANC FRACTURE: SHX5990

## 2016-02-18 SURGERY — OPEN REDUCTION INTERNAL FIXATION (ORIF) FOOT LISFRANC FRACTURE
Anesthesia: Regional | Site: Foot | Laterality: Left

## 2016-02-18 MED ORDER — OXYCODONE-ACETAMINOPHEN 10-325 MG PO TABS: 1.0000 | ORAL_TABLET | ORAL | Status: DC | PRN

## 2016-02-18 MED ORDER — ONDANSETRON HCL 4 MG/2ML IJ SOLN
INTRAMUSCULAR | Status: AC
  Filled 2016-02-18: qty 2

## 2016-02-18 MED ORDER — FENTANYL CITRATE (PF) 100 MCG/2ML IJ SOLN
INTRAMUSCULAR | Status: AC
  Administered 2016-02-18: 100 ug
  Filled 2016-02-18: qty 2

## 2016-02-18 MED ORDER — CHLORHEXIDINE GLUCONATE 4 % EX LIQD: 60.0000 mL | Freq: Once | CUTANEOUS | Status: DC

## 2016-02-18 MED ORDER — HYDROMORPHONE HCL 1 MG/ML IJ SOLN: 0.2500 mg | INTRAMUSCULAR | Status: DC | PRN

## 2016-02-18 MED ORDER — OXYCODONE HCL 5 MG PO TABS
ORAL_TABLET | ORAL | Status: AC
  Administered 2016-02-18: 5 mg via ORAL
  Filled 2016-02-18: qty 1

## 2016-02-18 MED ORDER — SCOPOLAMINE 1 MG/3DAYS TD PT72
MEDICATED_PATCH | TRANSDERMAL | Status: DC | PRN
  Administered 2016-02-18: 1 via TRANSDERMAL

## 2016-02-18 MED ORDER — LIDOCAINE HCL (CARDIAC) 20 MG/ML IV SOLN
INTRAVENOUS | Status: DC | PRN
  Administered 2016-02-18: 20 mg via INTRAVENOUS

## 2016-02-18 MED ORDER — KETOROLAC TROMETHAMINE 30 MG/ML IJ SOLN: 30.0000 mg | Freq: Once | INTRAMUSCULAR | Status: DC | PRN

## 2016-02-18 MED ORDER — LACTATED RINGERS IV SOLN
INTRAVENOUS | Status: DC | PRN
Start: 2016-02-18 — End: 2016-02-18
  Administered 2016-02-18: 13:00:00 via INTRAVENOUS

## 2016-02-18 MED ORDER — BUPIVACAINE-EPINEPHRINE (PF) 0.5% -1:200000 IJ SOLN
INTRAMUSCULAR | Status: DC | PRN
  Administered 2016-02-18: 25 mL via PERINEURAL

## 2016-02-18 MED ORDER — OXYCODONE HCL 5 MG PO TABS
5.0000 mg | ORAL_TABLET | Freq: Once | ORAL | Status: AC
  Administered 2016-02-18: 5 mg via ORAL

## 2016-02-18 MED ORDER — MIDAZOLAM HCL 2 MG/2ML IJ SOLN
INTRAMUSCULAR | Status: AC
  Filled 2016-02-18: qty 2

## 2016-02-18 MED ORDER — FENTANYL CITRATE (PF) 250 MCG/5ML IJ SOLN
INTRAMUSCULAR | Status: AC
  Filled 2016-02-18: qty 5

## 2016-02-18 MED ORDER — DEXAMETHASONE SODIUM PHOSPHATE 10 MG/ML IJ SOLN
INTRAMUSCULAR | Status: DC | PRN
  Administered 2016-02-18: 10 mg via INTRAVENOUS

## 2016-02-18 MED ORDER — MIDAZOLAM HCL 2 MG/2ML IJ SOLN
INTRAMUSCULAR | Status: AC
  Administered 2016-02-18: 2 mg
  Filled 2016-02-18: qty 2

## 2016-02-18 MED ORDER — HYDROCODONE-ACETAMINOPHEN 7.5-325 MG PO TABS: 1.0000 | ORAL_TABLET | Freq: Once | ORAL | Status: DC | PRN

## 2016-02-18 MED ORDER — FENTANYL CITRATE (PF) 100 MCG/2ML IJ SOLN
INTRAMUSCULAR | Status: DC | PRN
  Administered 2016-02-18 (×2): 25 ug via INTRAVENOUS

## 2016-02-18 MED ORDER — PROPOFOL 10 MG/ML IV BOLUS
INTRAVENOUS | Status: DC | PRN
  Administered 2016-02-18 (×2): 200 mg via INTRAVENOUS

## 2016-02-18 MED ORDER — 0.9 % SODIUM CHLORIDE (POUR BTL) OPTIME
TOPICAL | Status: DC | PRN
  Administered 2016-02-18: 1000 mL

## 2016-02-18 MED ORDER — DEXAMETHASONE SODIUM PHOSPHATE 10 MG/ML IJ SOLN
INTRAMUSCULAR | Status: AC
  Filled 2016-02-18: qty 1

## 2016-02-18 MED ORDER — LACTATED RINGERS IV SOLN
INTRAVENOUS | Status: DC
  Administered 2016-02-18: 11:00:00 via INTRAVENOUS

## 2016-02-18 SURGICAL SUPPLY — 46 items
BANDAGE ESMARK 6X9 LF (GAUZE/BANDAGES/DRESSINGS) ×1 IMPLANT
BIT DRILL CANN 3.2 (BIT) ×1 IMPLANT
BNDG COHESIVE 4X5 TAN STRL (GAUZE/BANDAGES/DRESSINGS) ×2 IMPLANT
BNDG ESMARK 6X9 LF (GAUZE/BANDAGES/DRESSINGS) ×2
BNDG GAUZE ELAST 4 BULKY (GAUZE/BANDAGES/DRESSINGS) ×2 IMPLANT
COVER SURGICAL LIGHT HANDLE (MISCELLANEOUS) ×2 IMPLANT
CUFF TOURNIQUET SINGLE 34IN LL (TOURNIQUET CUFF) IMPLANT
CUFF TOURNIQUET SINGLE 44IN (TOURNIQUET CUFF) IMPLANT
DRAPE INCISE IOBAN 66X45 STRL (DRAPES) ×2 IMPLANT
DRAPE OEC MINIVIEW 54X84 (DRAPES) ×2 IMPLANT
DRAPE PROXIMA HALF (DRAPES) ×2 IMPLANT
DRAPE U-SHAPE 47X51 STRL (DRAPES) ×2 IMPLANT
DRILL BIT CANN 3.2 (BIT) ×2
DRSG ADAPTIC 3X8 NADH LF (GAUZE/BANDAGES/DRESSINGS) ×2 IMPLANT
DRSG PAD ABDOMINAL 8X10 ST (GAUZE/BANDAGES/DRESSINGS) ×2 IMPLANT
DURAPREP 26ML APPLICATOR (WOUND CARE) ×2 IMPLANT
ELECT REM PT RETURN 9FT ADLT (ELECTROSURGICAL) ×2
ELECTRODE REM PT RTRN 9FT ADLT (ELECTROSURGICAL) ×1 IMPLANT
GAUZE SPONGE 4X4 12PLY STRL (GAUZE/BANDAGES/DRESSINGS) IMPLANT
GLOVE BIO SURGEON STRL SZ7 (GLOVE) ×2 IMPLANT
GLOVE BIOGEL PI IND STRL 9 (GLOVE) ×1 IMPLANT
GLOVE BIOGEL PI INDICATOR 9 (GLOVE) ×1
GLOVE SURG ORTHO 9.0 STRL STRW (GLOVE) ×2 IMPLANT
GOWN STRL REUS W/ TWL XL LVL3 (GOWN DISPOSABLE) ×3 IMPLANT
GOWN STRL REUS W/TWL XL LVL3 (GOWN DISPOSABLE) ×3
GUIDEWIRE 1.6 (WIRE) ×2
GUIDEWIRE ORTH 220X1.6XTROC (WIRE) ×2 IMPLANT
KIT BASIN OR (CUSTOM PROCEDURE TRAY) ×2 IMPLANT
KIT ROOM TURNOVER OR (KITS) ×2 IMPLANT
MANIFOLD NEPTUNE II (INSTRUMENTS) IMPLANT
NS IRRIG 1000ML POUR BTL (IV SOLUTION) ×2 IMPLANT
PACK ORTHO EXTREMITY (CUSTOM PROCEDURE TRAY) ×2 IMPLANT
PAD ARMBOARD 7.5X6 YLW CONV (MISCELLANEOUS) ×4 IMPLANT
SCREW COMPR HDLS ST 4.5X40 (Screw) ×2 IMPLANT
SCREW COMPR ST 4.5X30 (Screw) ×2 IMPLANT
SPONGE GAUZE 4X4 12PLY STER LF (GAUZE/BANDAGES/DRESSINGS) ×2 IMPLANT
SPONGE LAP 18X18 X RAY DECT (DISPOSABLE) ×2 IMPLANT
STAPLER VISISTAT 35W (STAPLE) IMPLANT
SUCTION FRAZIER HANDLE 10FR (MISCELLANEOUS) ×1
SUCTION TUBE FRAZIER 10FR DISP (MISCELLANEOUS) ×1 IMPLANT
SUT ETHILON 2 0 PSLX (SUTURE) ×4 IMPLANT
SUT VIC AB 2-0 CT1 27 (SUTURE) ×2
SUT VIC AB 2-0 CT1 TAPERPNT 27 (SUTURE) ×2 IMPLANT
TOWEL OR 17X24 6PK STRL BLUE (TOWEL DISPOSABLE) ×2 IMPLANT
TOWEL OR 17X26 10 PK STRL BLUE (TOWEL DISPOSABLE) ×2 IMPLANT
TUBE CONNECTING 12X1/4 (SUCTIONS) ×2 IMPLANT

## 2016-02-18 NOTE — Anesthesia Procedure Notes (Addendum)
Anesthesia Regional Block:  Popliteal block  Pre-Anesthetic Checklist: ,, timeout performed, Correct Patient, Correct Site, Correct Laterality, Correct Procedure, Correct Position, site marked, Risks and benefits discussed,  Surgical consent,  Pre-op evaluation,  At surgeon's request and post-op pain management  Laterality: Left  Prep: chloraprep       Needles:  Injection technique: Single-shot  Needle Type: Echogenic Stimulator Needle     Needle Length: 9cm 9 cm Needle Gauge: 21 and 21 G    Additional Needles:  Procedures: ultrasound guided (picture in chart) and nerve stimulator Popliteal block  Nerve Stimulator or Paresthesia:  Response: tibial and peroneal, 0.5 mA,   Additional Responses:   Narrative:  Injection made incrementally with aspirations every 5 mL.  Performed by: Personally  Anesthesiologist: Suzette Battiest   Procedure Name: LMA Insertion Date/Time: 02/18/2016 12:41 PM Performed by: Gaylene Brooks Pre-anesthesia Checklist: Patient identified, Emergency Drugs available, Suction available and Patient being monitored Patient Re-evaluated:Patient Re-evaluated prior to inductionOxygen Delivery Method: Circle System Utilized Preoxygenation: Pre-oxygenation with 100% oxygen Intubation Type: IV induction Ventilation: Mask ventilation without difficulty LMA: LMA inserted LMA Size: 4.0 Number of attempts: 1 Airway Equipment and Method: Bite block Placement Confirmation: positive ETCO2 Tube secured with: Tape Dental Injury: Teeth and Oropharynx as per pre-operative assessment

## 2016-02-18 NOTE — H&P (Signed)
Danielle Harrington is an 40 y.o. female.   Chief Complaint: Lisfranc fracture dislocation left foot. HPI: Patient is a 40 year old woman status post traumatic injury to the Lisfranc joint left foot. Patient is undergone prolonged conservative therapy still has persistent symptoms and presents at this time for internal fixation stabilization of the Lisfranc joint.  Past Medical History  Diagnosis Date  . Hypertension   . ADD (attention deficit disorder)   . Depression   . Complication of anesthesia     used scop. patch in the past  . PONV (postoperative nausea and vomiting)   . Family history of adverse reaction to anesthesia     N&V  . Anxiety   . Headache     migraine, last one 3-4 months ago   . Arthritis     R knee, sciata treated - injection- 2012    Past Surgical History  Procedure Laterality Date  . Cesarean section    . Appendectomy    . Tonsillectomy    . Knee surgery Right 1995 & 2009    ACL repair & arthroscopy -2009  . Laparoscopic endometriosis fulguration    . Ulnar nerve repair    . Wrist surgery Right     torn cartilage     No family history on file. Social History:  reports that she has been smoking.  She does not have any smokeless tobacco history on file. She reports that she drinks alcohol. She reports that she does not use illicit drugs.  Allergies:  Allergies  Allergen Reactions  . Adhesive [Tape] Itching and Rash    Please use "paper" tape    No prescriptions prior to admission    Results for orders placed or performed during the hospital encounter of 02/17/16 (from the past 48 hour(s))  Basic metabolic panel     Status: None   Collection Time: 02/17/16 10:08 AM  Result Value Ref Range   Sodium 141 135 - 145 mmol/L   Potassium 4.2 3.5 - 5.1 mmol/L   Chloride 108 101 - 111 mmol/L   CO2 25 22 - 32 mmol/L   Glucose, Bld 95 65 - 99 mg/dL   BUN 9 6 - 20 mg/dL   Creatinine, Ser 0.55 0.44 - 1.00 mg/dL   Calcium 9.3 8.9 - 10.3 mg/dL   GFR calc  non Af Amer >60 >60 mL/min   GFR calc Af Amer >60 >60 mL/min    Comment: (NOTE) The eGFR has been calculated using the CKD EPI equation. This calculation has not been validated in all clinical situations. eGFR's persistently <60 mL/min signify possible Chronic Kidney Disease.    Anion gap 8 5 - 15  CBC     Status: Abnormal   Collection Time: 02/17/16 10:08 AM  Result Value Ref Range   WBC 5.2 4.0 - 10.5 K/uL   RBC 3.88 3.87 - 5.11 MIL/uL   Hemoglobin 11.8 (L) 12.0 - 15.0 g/dL   HCT 36.2 36.0 - 46.0 %   MCV 93.3 78.0 - 100.0 fL   MCH 30.4 26.0 - 34.0 pg   MCHC 32.6 30.0 - 36.0 g/dL   RDW 13.5 11.5 - 15.5 %   Platelets 299 150 - 400 K/uL  hCG, serum, qualitative     Status: None   Collection Time: 02/17/16 10:08 AM  Result Value Ref Range   Preg, Serum NEGATIVE NEGATIVE    Comment:        THE SENSITIVITY OF THIS METHODOLOGY IS >10 mIU/mL.  No results found.  Review of Systems  All other systems reviewed and are negative.   There were no vitals taken for this visit. Physical Exam  On examination patient has a palpable dorsalis pedis pulse. She has instability across the Lisfranc joint with any attempted manipulation is painful. Assessment/Plan Assessment: Left foot Lisfranc fracture dislocation.  Plan: We will plan for open reduction internal fixation of the base of the first metatarsal medial cuneiform and open reduction internal fixation of the Lisfranc complex. Risks and benefits of surgery were discussed including pain neurovascular injury DVT need for additional surgery. Patient states she understands wish to proceed at this time.  Newt Minion, MD 02/18/2016, 6:33 AM

## 2016-02-18 NOTE — Anesthesia Postprocedure Evaluation (Signed)
Anesthesia Post Note  Patient: Danielle Harrington  Procedure(s) Performed: Procedure(s) (LRB): OPEN REDUCTION INTERNAL FIXATION (ORIF) FOOT LISFRANC FRACTURE (Left)  Patient location during evaluation: PACU Anesthesia Type: General and Regional Level of consciousness: awake and alert Pain management: pain level controlled Vital Signs Assessment: post-procedure vital signs reviewed and stable Respiratory status: spontaneous breathing, nonlabored ventilation and respiratory function stable Cardiovascular status: blood pressure returned to baseline and stable Postop Assessment: no signs of nausea or vomiting Anesthetic complications: no    Last Vitals:  Filed Vitals:   02/18/16 1315 02/18/16 1348  BP: 136/86 139/89  Pulse:  74  Temp: 36.6 C   Resp:      Last Pain:  Filed Vitals:   02/18/16 1349  PainSc: 2                  Tiajuana Amass

## 2016-02-18 NOTE — Transfer of Care (Signed)
Immediate Anesthesia Transfer of Care Note  Patient: Danielle Harrington  Procedure(s) Performed: Procedure(s): OPEN REDUCTION INTERNAL FIXATION (ORIF) FOOT LISFRANC FRACTURE (Left)  Patient Location: PACU  Anesthesia Type:General  Level of Consciousness: awake, alert  and oriented  Airway & Oxygen Therapy: Patient Spontanous Breathing and Patient connected to nasal cannula oxygen  Post-op Assessment: Report given to RN, Post -op Vital signs reviewed and stable and Patient moving all extremities X 4  Post vital signs: Reviewed and stable  Last Vitals:  Filed Vitals:   02/18/16 1135 02/18/16 1145  BP: 134/73 126/78  Pulse: 85 96  Temp:    Resp: 10 19    Last Pain:  Filed Vitals:   02/18/16 1318  PainSc: 2          Complications: No apparent anesthesia complications

## 2016-02-18 NOTE — Op Note (Signed)
02/18/2016  1:07 PM  PATIENT:  Danielle Harrington    PRE-OPERATIVE DIAGNOSIS:  Lisfranc Fracture Left Foot  POST-OPERATIVE DIAGNOSIS:  Same  PROCEDURE:  OPEN REDUCTION INTERNAL FIXATION (ORIF) FOOT LISFRANC FRACTURE  SURGEON:  Newt Minion, MD  PHYSICIAN ASSISTANT:None ANESTHESIA:   General  PREOPERATIVE INDICATIONS:  Danielle Harrington is a  40 y.o. female with a diagnosis of Lisfranc Fracture Left Foot who failed conservative measures and elected for surgical management.    The risks benefits and alternatives were discussed with the patient preoperatively including but not limited to the risks of infection, bleeding, nerve injury, cardiopulmonary complications, the need for revision surgery, among others, and the patient was willing to proceed.  OPERATIVE IMPLANTS: 4.5 headless cannulated screws  OPERATIVE FINDINGS: unstable base of the first metatarsal medial cuneiform and unstable Lisfranc joint  OPERATIVE PROCEDURE: atient was brought to the operating room and underwent a general anesthetic after a popliteal block. After adequate levels anesthesia obtained patient's left lower extremity was prepped using DuraPrep draped into a sterile field. A timeout was called. A dorsal incision was made over the first webspace. Blunt dissection was carried down to the base of the first metatarsal medial cuneiform joint. Retractors were place the joint was completely unstable. Osteophytic bone spurs removed dorsally the joint was reduced and a K wire was used from dorsal distal to proximal plantar to stabilize the base of first metatarsal medial cuneiform. The Lisfranc joint was then debrided of fibrous tissue. A K wire was then inserted from the medial cuneiform through the ace of the second metatarsal. C-arm floss be verified alignment. A headless compression screw was placed to stabilize both joints. C-arm floss be verified alignment. The wound was irrigated with normal saline. The skin was closed  using 2-0 nylon. A sterile compressive dressing was applied patient was extubated taken the PACU in stable condition. Plan for discharge to home per prescription for Percocet 10 mg tablets #60 follow-up in the office in 1 week.

## 2016-02-18 NOTE — Anesthesia Preprocedure Evaluation (Addendum)
Anesthesia Evaluation  Patient identified by MRN, date of birth, ID band Patient awake    Reviewed: Allergy & Precautions, NPO status , Patient's Chart, lab work & pertinent test results  History of Anesthesia Complications (+) PONV  Airway Mallampati: II  TM Distance: >3 FB Neck ROM: Full    Dental  (+) Dental Advisory Given   Pulmonary Current Smoker,    breath sounds clear to auscultation       Cardiovascular hypertension, Pt. on medications  Rhythm:Regular Rate:Normal     Neuro/Psych Anxiety Depression negative neurological ROS     GI/Hepatic negative GI ROS, Neg liver ROS,   Endo/Other  negative endocrine ROS  Renal/GU negative Renal ROS     Musculoskeletal  (+) Arthritis ,   Abdominal   Peds  Hematology negative hematology ROS (+)   Anesthesia Other Findings   Reproductive/Obstetrics                            Lab Results  Component Value Date   WBC 5.2 02/17/2016   HGB 11.8* 02/17/2016   HCT 36.2 02/17/2016   MCV 93.3 02/17/2016   PLT 299 02/17/2016   Lab Results  Component Value Date   CREATININE 0.55 02/17/2016   BUN 9 02/17/2016   NA 141 02/17/2016   K 4.2 02/17/2016   CL 108 02/17/2016   CO2 25 02/17/2016    Anesthesia Physical Anesthesia Plan  ASA: II  Anesthesia Plan: General and Regional   Post-op Pain Management: GA combined w/ Regional for post-op pain   Induction: Intravenous  Airway Management Planned: LMA  Additional Equipment:   Intra-op Plan:   Post-operative Plan: Extubation in OR  Informed Consent: I have reviewed the patients History and Physical, chart, labs and discussed the procedure including the risks, benefits and alternatives for the proposed anesthesia with the patient or authorized representative who has indicated his/her understanding and acceptance.   Dental advisory given  Plan Discussed with: CRNA  Anesthesia Plan  Comments:         Anesthesia Quick Evaluation

## 2016-02-21 ENCOUNTER — Encounter (HOSPITAL_COMMUNITY): Payer: Self-pay | Admitting: Orthopedic Surgery

## 2016-07-27 ENCOUNTER — Ambulatory Visit (INDEPENDENT_AMBULATORY_CARE_PROVIDER_SITE_OTHER): Payer: Managed Care, Other (non HMO) | Admitting: Orthopedic Surgery

## 2016-07-27 DIAGNOSIS — G5762 Lesion of plantar nerve, left lower limb: Secondary | ICD-10-CM | POA: Diagnosis not present

## 2016-08-03 ENCOUNTER — Ambulatory Visit (INDEPENDENT_AMBULATORY_CARE_PROVIDER_SITE_OTHER): Payer: Self-pay | Admitting: Orthopedic Surgery

## 2016-08-14 ENCOUNTER — Other Ambulatory Visit (INDEPENDENT_AMBULATORY_CARE_PROVIDER_SITE_OTHER): Payer: Self-pay | Admitting: Orthopedic Surgery

## 2016-08-14 MED ORDER — HYDROCODONE-ACETAMINOPHEN 5-325 MG PO TABS: 1.0000 | ORAL_TABLET | ORAL | 0 refills | Status: DC | PRN

## 2016-08-14 NOTE — Telephone Encounter (Signed)
Patient called wanting to know if she could get a refill on her pain medication. Patient had to cancel surgery due to having to pay deductible up front and will see if it can be done at the hospital. Patient is in pain.

## 2016-08-14 NOTE — Telephone Encounter (Signed)
Called pt and advised that the rx is at the front desk ready for pick up. She also wanted to make sure that message has been received that she is cx surgery for tomorrow. I told the pt that I will notify Malachy Mood right now.

## 2016-08-14 NOTE — Telephone Encounter (Signed)
Prescription printed and available for pick up

## 2016-08-30 ENCOUNTER — Emergency Department (HOSPITAL_COMMUNITY)
Admission: EM | Admit: 2016-08-30 | Discharge: 2016-08-30 | Disposition: A | Discharge status: Home / Self Care | Payer: 59 | Attending: Emergency Medicine | Admitting: Emergency Medicine

## 2016-08-30 ENCOUNTER — Emergency Department (HOSPITAL_COMMUNITY): Payer: 59

## 2016-08-30 ENCOUNTER — Encounter (HOSPITAL_COMMUNITY): Payer: Self-pay

## 2016-08-30 DIAGNOSIS — R69 Illness, unspecified: Secondary | ICD-10-CM

## 2016-08-30 DIAGNOSIS — Z79899 Other long term (current) drug therapy: Secondary | ICD-10-CM | POA: Insufficient documentation

## 2016-08-30 DIAGNOSIS — R0602 Shortness of breath: Secondary | ICD-10-CM | POA: Insufficient documentation

## 2016-08-30 DIAGNOSIS — M791 Myalgia: Secondary | ICD-10-CM | POA: Insufficient documentation

## 2016-08-30 DIAGNOSIS — J111 Influenza due to unidentified influenza virus with other respiratory manifestations: Secondary | ICD-10-CM

## 2016-08-30 DIAGNOSIS — Z87891 Personal history of nicotine dependence: Secondary | ICD-10-CM | POA: Insufficient documentation

## 2016-08-30 DIAGNOSIS — J029 Acute pharyngitis, unspecified: Secondary | ICD-10-CM | POA: Insufficient documentation

## 2016-08-30 DIAGNOSIS — I10 Essential (primary) hypertension: Secondary | ICD-10-CM | POA: Insufficient documentation

## 2016-08-30 DIAGNOSIS — R51 Headache: Secondary | ICD-10-CM | POA: Insufficient documentation

## 2016-08-30 LAB — I-STAT CHEM 8, ED
BUN: 10 mg/dL (ref 6–20)
Calcium, Ion: 1.16 mmol/L (ref 1.15–1.40)
Chloride: 102 mmol/L (ref 101–111)
Creatinine, Ser: 0.7 mg/dL (ref 0.44–1.00)
Glucose, Bld: 85 mg/dL (ref 65–99)
HCT: 39 % (ref 36.0–46.0)
Hemoglobin: 13.3 g/dL (ref 12.0–15.0)
Potassium: 3.6 mmol/L (ref 3.5–5.1)
Sodium: 139 mmol/L (ref 135–145)
TCO2: 29 mmol/L (ref 0–100)

## 2016-08-30 MED ORDER — ACETAMINOPHEN 500 MG PO TABS
1000.0000 mg | ORAL_TABLET | Freq: Once | ORAL | Status: AC
  Administered 2016-08-30: 1000 mg via ORAL
  Filled 2016-08-30: qty 2

## 2016-08-30 MED ORDER — ALBUTEROL SULFATE (2.5 MG/3ML) 0.083% IN NEBU
5.0000 mg | INHALATION_SOLUTION | Freq: Once | RESPIRATORY_TRACT | Status: AC
  Administered 2016-08-30: 5 mg via RESPIRATORY_TRACT
  Filled 2016-08-30: qty 6

## 2016-08-30 MED ORDER — SODIUM CHLORIDE 0.9 % IV BOLUS (SEPSIS)
1000.0000 mL | Freq: Once | INTRAVENOUS | Status: AC
  Administered 2016-08-30: 1000 mL via INTRAVENOUS

## 2016-08-30 NOTE — ED Provider Notes (Signed)
[facility] DEPT Provider Note   CSN: [id number] Arrival date & time: 08/30/16  1515     History   Chief Complaint Chief Complaint  Patient presents with  . Shortness of Breath    HPI Danielle Harrington is a 40 y.o. female.  HPI complains of productive cough onset 9 days ago, denies shortness of breath. Other associated symptoms include headache, sore throat and diffuse myalgias. She was seen by her PMD Dr. Nancy Fetter 9 days ago, started on amoxicillin and prednisone and given albuterol. Antibiotic was switched to Augmentin 7 days ago. Other associated symptoms include diminished appetite and generalized weakness. She denies vomiting or nausea or diarrhea. Treated with Tylenol last dose yesterday as well as antibiotic prednisone and albuterol, without relief.  Past Medical History:  Diagnosis Date  . ADD (attention deficit disorder)   . Anxiety   . Arthritis    R knee, sciata treated - injection- 2012  . Complication of anesthesia    used scop. patch in the past  . Depression   . Family history of adverse reaction to anesthesia    N&V  . Headache    migraine, last one 3-4 months ago   . Hypertension   . PONV (postoperative nausea and vomiting)     There are no active problems to display for this patient.   Past Surgical History:  Procedure Laterality Date  . APPENDECTOMY    . CESAREAN SECTION    . KNEE SURGERY Right 1995 & 2009   ACL repair & arthroscopy -2009  . LAPAROSCOPIC ENDOMETRIOSIS FULGURATION    . OPEN REDUCTION INTERNAL FIXATION (ORIF) FOOT LISFRANC FRACTURE Left 02/18/2016   Procedure: OPEN REDUCTION INTERNAL FIXATION (ORIF) FOOT LISFRANC FRACTURE;  Surgeon: Newt Minion, MD;  Location: [facility];  Service: Orthopedics;  Laterality: Left;  . TONSILLECTOMY    . ULNAR NERVE REPAIR    . WRIST SURGERY Right    torn cartilage     OB History    No data available       Home Medications    Prior to Admission medications   Medication Sig Start Date End Date  Taking? Authorizing Provider  ALPRAZolam Duanne Moron) 0.5 MG tablet Take 0.5 mg by mouth 2 (two) times daily as needed for anxiety (anxiety).  12/22/14   Historical Provider, MD  buPROPion (WELLBUTRIN XL) 300 MG 24 hr tablet Take 300 mg by mouth daily before breakfast.  02/01/15   Historical Provider, MD  celecoxib (CELEBREX) 200 MG capsule Take 200 mg by mouth 2 (two) times daily as needed for mild pain.  12/22/15   Historical Provider, MD  citalopram (CELEXA) 20 MG tablet Take 20 mg by mouth at bedtime.  01/13/15   Historical Provider, MD  gabapentin (NEURONTIN) 300 MG capsule Take 300 mg by mouth at bedtime.  12/08/15   Historical Provider, MD  HYDROcodone-acetaminophen (NORCO/VICODIN) 5-325 MG tablet Take 1 tablet by mouth every 4 (four) hours as needed for moderate pain. 08/14/16   Newt Minion, MD  ibuprofen (ADVIL,MOTRIN) 200 MG tablet Take 400 mg by mouth every 6 (six) hours as needed for moderate pain (pain).    Historical Provider, MD  levocetirizine (XYZAL) 5 MG tablet Take 5 mg by mouth every evening. 01/08/15   Historical Provider, MD  losartan-hydrochlorothiazide (HYZAAR) 100-12.5 MG per tablet Take 1 tablet by mouth daily. 01/08/15   Historical Provider, MD  Multiple Vitamin (MULTIVITAMIN) tablet Take 1 tablet by mouth daily.    Historical Provider, MD  oxyCODONE-acetaminophen (  PERCOCET) 10-325 MG tablet Take 1 tablet by mouth every 4 (four) hours as needed for pain. 02/18/16   Newt Minion, MD  oxyCODONE-acetaminophen (PERCOCET/ROXICET) 5-325 MG tablet Take 1 tablet by mouth 2 (two) times daily as needed.  02/11/16   Historical Provider, MD  STRATTERA 80 MG capsule Take 80 mg by mouth every morning. 01/01/15   Historical Provider, MD  traMADol (ULTRAM) 50 MG tablet Take 50 mg by mouth every 6 (six) hours as needed. 02/02/16   Historical Provider, MD  YASMIN 28 3-0.03 MG tablet Take 1 tablet by mouth daily.  01/05/15   Historical Provider, MD  zolpidem (AMBIEN) 10 MG tablet Take 5 mg by mouth at  bedtime. for sleep 01/11/15   Historical Provider, MD  Augmentin, prednisone, albuterol  Family History History reviewed. No pertinent family history.  Social History Social History  Substance Use Topics  . Smoking status: Former Smoker    Packs/day: 0.50  . Smokeless tobacco: Never Used     Comment: Sts she recently quit (08/30/16)  . Alcohol use Yes     Comment: socially- 1-2 drinks per week    Quit smoking 3 days ago Denies drug use Allergies   Adhesive [tape]   Review of Systems Review of Systems  Constitutional: Positive for appetite change.  HENT: Positive for sore throat.   Respiratory: Positive for cough and shortness of breath.   Cardiovascular: Negative.   Gastrointestinal: Negative.   Genitourinary:       Amenorrheic  Musculoskeletal: Positive for myalgias.       Diffuse myalgias  Skin: Negative.   Neurological: Positive for weakness and headaches.       Generalized weakness  Psychiatric/Behavioral: Negative.   All other systems reviewed and are negative.    Physical Exam Updated Vital Signs BP 146/96 (BP Location: Right Arm)   Pulse 66   Temp 98.5 F (36.9 C) (Oral)   Resp 16   Ht 5' 4"  (1.626 m)   Wt 150 lb (68 kg)   SpO2 99%   BMI 25.75 kg/m   Physical Exam  Constitutional: She appears well-developed and well-nourished. No distress.  HENT:  Head: Normocephalic and atraumatic.  Mucous membranes dry  Eyes: Conjunctivae are normal. Pupils are equal, round, and reactive to light.  Neck: Neck supple. No tracheal deviation present. No thyromegaly present.  Cardiovascular: Normal rate and regular rhythm.   No murmur heard. Pulmonary/Chest: Effort normal and breath sounds normal.  Abdominal: Soft. Bowel sounds are normal. She exhibits no distension. There is no tenderness.  Musculoskeletal: Normal range of motion. She exhibits no edema or tenderness.  Neurological: She is alert. Coordination normal.  Skin: Skin is warm and dry. No rash noted.    Psychiatric: She has a normal mood and affect.  Nursing note and vitals reviewed.    ED Treatments / Results  Labs (all labs ordered are listed, but only abnormal results are displayed) Labs Reviewed  I-STAT CHEM 8, ED    EKG  EKG Interpretation  Date/Time:  Wednesday August 30 2016 15:29:33 EST Ventricular Rate:  69 PR Interval:    QRS Duration: 79 QT Interval:  380 QTC Calculation: 408 R Axis:   85 Text Interpretation:  Sinus rhythm Low voltage, precordial leads Borderline T abnormalities, anterior leads No significant change since last tracing Confirmed by Winfred Leeds  MD, Stellarose Cerny [phone]) on 08/30/2016 4:23:19 PM      Chest x-ray viewed by me Radiology Dg Chest 2 View  Result Date: 08/30/2016  CLINICAL DATA:  40 year old female with history of shortness of breath and wheezing for 1 week. History of bronchitis. Former smoker. EXAM: CHEST  2 VIEW COMPARISON:  Chest x-ray 03/25/2008. FINDINGS: Lung volumes are normal. No consolidative airspace disease. No pleural effusions. No pneumothorax. No pulmonary nodule or mass noted. Pulmonary vasculature and the cardiomediastinal silhouette are within normal limits. IMPRESSION: No radiographic evidence of acute cardiopulmonary disease. Electronically Signed   By: Vinnie Langton M.D.   On: 08/30/2016 16:12    Procedures Procedures (including critical care time)  Medications Ordered in ED Medications  albuterol (PROVENTIL) (2.5 MG/3ML) 0.083% nebulizer solution 5 mg (not administered)  sodium chloride 0.9 % bolus 1,000 mL (not administered)  acetaminophen (TYLENOL) tablet 1,000 mg (not administered)   Results for orders placed or performed during the hospital encounter of 08/30/16  I-stat chem 8, ed  Result Value Ref Range   Sodium 139 135 - 145 mmol/L   Potassium 3.6 3.5 - 5.1 mmol/L   Chloride 102 101 - 111 mmol/L   BUN 10 6 - 20 mg/dL   Creatinine, Ser 0.70 0.44 - 1.00 mg/dL   Glucose, Bld 85 65 - 99 mg/dL   Calcium, Ion  1.16 1.15 - 1.40 mmol/L   TCO2 29 0 - 100 mmol/L   Hemoglobin 13.3 12.0 - 15.0 g/dL   HCT 39.0 36.0 - 46.0 %   Dg Chest 2 View  Result Date: 08/30/2016 CLINICAL DATA:  40 year old female with history of shortness of breath and wheezing for 1 week. History of bronchitis. Former smoker. EXAM: CHEST  2 VIEW COMPARISON:  Chest x-ray 03/25/2008. FINDINGS: Lung volumes are normal. No consolidative airspace disease. No pleural effusions. No pneumothorax. No pulmonary nodule or mass noted. Pulmonary vasculature and the cardiomediastinal silhouette are within normal limits. IMPRESSION: No radiographic evidence of acute cardiopulmonary disease. Electronically Signed   By: Vinnie Langton M.D.   On: 08/30/2016 16:12    Initial Impression / Assessment and Plan / ED Course  I have reviewed the triage vital signs and the nursing notes.  Pertinent labs & imaging results that were available during my care of the patient were reviewed by me and considered in my medical decision making (see chart for details).  Clinical Course    6:55 PM patient is not feeling improved after treatment with intravenous fluids, otherwise treatment and Tylenol. She does feel ready to go home. She appears in no distress. I advised her that she can stop Augmentin. She is advised to stay away from cigarettes. Use her albuterol inhaler 2 puffs every 4 hours as needed for cough or shortness of breath. Tylenol for aches. Encourage oral hydration. Final Clinical Impressions(s) / ED Diagnoses  Diagnosis influenza-like illness Final diagnoses:  None    New Prescriptions New Prescriptions   No medications on file     Orlie Dakin, MD 08/30/16 1902

## 2016-08-30 NOTE — Discharge Instructions (Signed)
It is okay to stop Augmentin. Take Tylenol as directed every 4 hours for aches for temperature. Stay away from cigarettes. You can use her albuterol inhaler 2 puffs every 4 hours as needed for cough or shortness of breath. Make sure the drink at least six 8 ounce glasses of water or Gatorade each day in order to stay well-hydrated. Call Dr. Nancy Fetter to arrange to be seen if not feeling better in 3 or 4 days, or return if your condition worsens for any reason.

## 2016-08-30 NOTE — ED Triage Notes (Addendum)
Per EMS, Pt, from home, c/o SOB x 1 week and chest pain w/ deep breathing.  Pain score 8/10.  Pt was recently diagnosed w/ bronchitis.  Sts today she is feeling "worse."  Pt is currently taking Augmentin, albuterol, and prednisone.

## 2016-08-31 ENCOUNTER — Telehealth (INDEPENDENT_AMBULATORY_CARE_PROVIDER_SITE_OTHER): Payer: Self-pay

## 2016-08-31 MED ORDER — GABAPENTIN 300 MG PO CAPS: 300.0000 mg | ORAL_CAPSULE | Freq: Three times a day (TID) | ORAL | 2 refills | Status: DC

## 2016-08-31 MED ORDER — NABUMETONE 750 MG PO TABS: 750.0000 mg | ORAL_TABLET | Freq: Two times a day (BID) | ORAL | 2 refills | Status: DC

## 2016-08-31 NOTE — Telephone Encounter (Signed)
Both rx's entered for cosignature to escribe to patients pharmacy.

## 2016-08-31 NOTE — Telephone Encounter (Signed)
Ok refill? 

## 2016-08-31 NOTE — Telephone Encounter (Signed)
Pharm requesting 90 day supply on Gabapentin 368m 1 po tid prn nerve pain and for nabumetone 7560m1 po bid

## 2016-09-04 ENCOUNTER — Telehealth (INDEPENDENT_AMBULATORY_CARE_PROVIDER_SITE_OTHER): Payer: Self-pay | Admitting: Orthopedic Surgery

## 2016-09-04 ENCOUNTER — Other Ambulatory Visit (INDEPENDENT_AMBULATORY_CARE_PROVIDER_SITE_OTHER): Payer: Self-pay | Admitting: Orthopedic Surgery

## 2016-09-04 MED ORDER — HYDROCODONE-ACETAMINOPHEN 5-325 MG PO TABS: 1.0000 | ORAL_TABLET | ORAL | 0 refills | Status: DC | PRN

## 2016-09-04 NOTE — Telephone Encounter (Signed)
I called and sw pt to advise that rx is available for pick up at the front desk

## 2016-09-04 NOTE — Telephone Encounter (Signed)
Patient called needing Rx refilled (hydrocodone)  Patient also advised she is waiting to be scheduled for surgery.  The number to contact her is [phone]

## 2016-09-04 NOTE — Telephone Encounter (Signed)
rx written

## 2016-09-06 NOTE — Telephone Encounter (Signed)
I spoke with patient. We have her scheduled for surgery on 09/29/2016. All details given.

## 2016-09-12 ENCOUNTER — Other Ambulatory Visit (INDEPENDENT_AMBULATORY_CARE_PROVIDER_SITE_OTHER): Payer: Self-pay | Admitting: Family

## 2016-09-18 ENCOUNTER — Other Ambulatory Visit (INDEPENDENT_AMBULATORY_CARE_PROVIDER_SITE_OTHER): Payer: Self-pay | Admitting: Orthopedic Surgery

## 2016-09-18 MED ORDER — HYDROCODONE-ACETAMINOPHEN 5-325 MG PO TABS: 1.0000 | ORAL_TABLET | ORAL | 0 refills | Status: DC | PRN

## 2016-09-22 ENCOUNTER — Encounter (HOSPITAL_COMMUNITY)
Admission: RE | Admit: 2016-09-22 | Discharge: 2016-09-22 | Disposition: A | Discharge status: Home / Self Care | Payer: Managed Care, Other (non HMO) | Source: Ambulatory Visit | Attending: Orthopedic Surgery | Admitting: Orthopedic Surgery

## 2016-09-22 ENCOUNTER — Encounter (HOSPITAL_COMMUNITY): Payer: Self-pay

## 2016-09-22 DIAGNOSIS — Z01818 Encounter for other preprocedural examination: Secondary | ICD-10-CM | POA: Diagnosis present

## 2016-09-22 HISTORY — DX: Insomnia, unspecified: G47.00

## 2016-09-22 HISTORY — DX: Personal history of other diseases of the respiratory system: Z87.09

## 2016-09-22 HISTORY — DX: Personal history of urinary calculi: Z87.442

## 2016-09-22 HISTORY — DX: Personal history of diseases of the blood and blood-forming organs and certain disorders involving the immune mechanism: Z86.2

## 2016-09-22 LAB — CBC
HEMATOCRIT: 35.3 % — AB (ref 36.0–46.0)
HEMOGLOBIN: 11.8 g/dL — AB (ref 12.0–15.0)
MCH: 31.1 pg (ref 26.0–34.0)
MCHC: 33.4 g/dL (ref 30.0–36.0)
MCV: 93.1 fL (ref 78.0–100.0)
Platelets: 262 10*3/uL (ref 150–400)
RBC: 3.79 MIL/uL — ABNORMAL LOW (ref 3.87–5.11)
RDW: 12.6 % (ref 11.5–15.5)
WBC: 7.1 10*3/uL (ref 4.0–10.5)

## 2016-09-22 LAB — BASIC METABOLIC PANEL
ANION GAP: 7 (ref 5–15)
BUN: 17 mg/dL (ref 6–20)
CHLORIDE: 104 mmol/L (ref 101–111)
CO2: 25 mmol/L (ref 22–32)
Calcium: 9.1 mg/dL (ref 8.9–10.3)
Creatinine, Ser: 0.88 mg/dL (ref 0.44–1.00)
GFR calc Af Amer: >60 mL/min (ref >60)
GLUCOSE: 103 mg/dL — AB (ref 65–99)
POTASSIUM: 3.6 mmol/L (ref 3.5–5.1)
Sodium: 136 mmol/L (ref 135–145)

## 2016-09-22 LAB — HCG, SERUM, QUALITATIVE: Preg, Serum: NEGATIVE

## 2016-09-22 NOTE — Pre-Procedure Instructions (Signed)
Danielle Harrington  09/22/2016      [id number] [place] [id number]: [phone]:: [phone]   Your procedure is scheduled on Friday, December 8th, 2017.  [facility]   Call this number if you have problems the morning of surgery:  [phone]   Remember:  Do not eat food or drink liquids after midnight.   Take these medicines the morning of surgery with A SIP OF WATER: Alprazolam (Xanax) if needed, Bupropion (Wellbutrin), Gabapentin (Neurontin), Hydrocodone-acetaminophen (Norco/Vicodin), Proair Inhaler if needed (please bring with you), Yasmin.  Stop taking: Aspirin, NSAIDS, Ibuprofen, Advil, Motrin, BC's, Goody's, Naproxen, Aleve, Fish oil, all herbal medications, and all vitamins.    Do not wear jewelry, make-up or nail polish.  Do not wear lotions, powders, or perfumes, or deoderant.  Do not shave 48 hours prior to surgery.    Do not bring valuables to the hospital.  [facility] or [facility].  Contacts, dentures or bridgework may not be worn into surgery.  Leave your suitcase in the car.  After surgery it may be brought to your room.  For patients admitted to the hospital, discharge time will be determined by your treatment team.  Patients discharged the day of surgery will not be allowed to drive home.   Special instructions:  Preparing for Surgery.   [facility]- Preparing For Surgery  Before surgery, you can play an important role. Because skin is not sterile, your skin needs to be as free of germs as possible. You can reduce the number of germs on your skin by washing with CHG (chlorahexidine gluconate) Soap before surgery.  CHG is an antiseptic cleaner which kills germs and bonds with the skin to continue killing germs even after washing.  Please do not use if you have an allergy to CHG or antibacterial  soaps. If your skin becomes reddened/irritated stop using the CHG.  Do not shave (including legs and underarms) for at least 48 hours prior to first CHG shower. It is OK to shave your face.  Please follow these instructions carefully.   1. Shower the NIGHT BEFORE SURGERY and the MORNING OF SURGERY with CHG.   2. If you chose to wash your hair, wash your hair first as usual with your normal shampoo.  3. After you shampoo, rinse your hair and body thoroughly to remove the shampoo.  4. Use CHG as you would any other liquid soap. You can apply CHG directly to the skin and wash gently with a scrungie or a clean washcloth.   5. Apply the CHG Soap to your body ONLY FROM THE NECK DOWN.  Do not use on open wounds or open sores. Avoid contact with your eyes, ears, mouth and genitals (private parts). Wash genitals (private parts) with your normal soap.  6. Wash thoroughly, paying special attention to the area where your surgery will be performed.  7. Thoroughly rinse your body with warm water from the neck down.  8. DO NOT shower/wash with your normal soap after using and rinsing off the CHG Soap.  9. Pat yourself dry with a CLEAN TOWEL.   10. Wear CLEAN PAJAMAS   11. Place CLEAN SHEETS on your bed the night of your first shower and DO NOT SLEEP WITH PETS.   Day of Surgery: Do not apply any deodorants/lotions. Please wear clean clothes to  the hospital/surgery center.     Please read over the following fact sheets that you were given.

## 2016-09-29 ENCOUNTER — Ambulatory Visit (HOSPITAL_COMMUNITY): Payer: Managed Care, Other (non HMO) | Admitting: Anesthesiology

## 2016-09-29 ENCOUNTER — Encounter (HOSPITAL_COMMUNITY): Payer: Self-pay | Admitting: *Deleted

## 2016-09-29 ENCOUNTER — Encounter (HOSPITAL_COMMUNITY)
Admission: RE | Disposition: A | Discharge status: Home / Self Care | Payer: Self-pay | Source: Ambulatory Visit | Attending: Orthopedic Surgery

## 2016-09-29 ENCOUNTER — Ambulatory Visit (HOSPITAL_COMMUNITY)
Admission: RE | Admit: 2016-09-29 | Discharge: 2016-09-29 | Disposition: A | Discharge status: Home / Self Care | Payer: Managed Care, Other (non HMO) | Source: Ambulatory Visit | Attending: Orthopedic Surgery | Admitting: Orthopedic Surgery

## 2016-09-29 DIAGNOSIS — F329 Major depressive disorder, single episode, unspecified: Secondary | ICD-10-CM | POA: Insufficient documentation

## 2016-09-29 DIAGNOSIS — F419 Anxiety disorder, unspecified: Secondary | ICD-10-CM | POA: Insufficient documentation

## 2016-09-29 DIAGNOSIS — Z87891 Personal history of nicotine dependence: Secondary | ICD-10-CM | POA: Diagnosis not present

## 2016-09-29 DIAGNOSIS — I10 Essential (primary) hypertension: Secondary | ICD-10-CM | POA: Diagnosis not present

## 2016-09-29 DIAGNOSIS — Z79899 Other long term (current) drug therapy: Secondary | ICD-10-CM | POA: Diagnosis not present

## 2016-09-29 DIAGNOSIS — G5762 Lesion of plantar nerve, left lower limb: Secondary | ICD-10-CM | POA: Diagnosis not present

## 2016-09-29 HISTORY — PX: EXCISION MORTON'S NEUROMA: SHX5013

## 2016-09-29 SURGERY — EXCISION, MORTON'S NEUROMA
Anesthesia: Regional | Site: Foot | Laterality: Left

## 2016-09-29 MED ORDER — ONDANSETRON HCL 4 MG/2ML IJ SOLN
INTRAMUSCULAR | Status: DC | PRN
  Administered 2016-09-29: 4 mg via INTRAVENOUS

## 2016-09-29 MED ORDER — PROPOFOL 10 MG/ML IV BOLUS
INTRAVENOUS | Status: AC
  Filled 2016-09-29: qty 20

## 2016-09-29 MED ORDER — LACTATED RINGERS IV SOLN
INTRAVENOUS | Status: DC
  Administered 2016-09-29 (×2): via INTRAVENOUS

## 2016-09-29 MED ORDER — OXYCODONE-ACETAMINOPHEN 5-325 MG PO TABS
ORAL_TABLET | ORAL | Status: AC
  Administered 2016-09-29: 1 via ORAL
  Filled 2016-09-29: qty 1

## 2016-09-29 MED ORDER — FENTANYL CITRATE (PF) 100 MCG/2ML IJ SOLN
INTRAMUSCULAR | Status: DC | PRN
  Administered 2016-09-29 (×2): 50 ug via INTRAVENOUS

## 2016-09-29 MED ORDER — FENTANYL CITRATE (PF) 100 MCG/2ML IJ SOLN
25.0000 ug | INTRAMUSCULAR | Status: DC | PRN
  Administered 2016-09-29 (×2): 50 ug via INTRAVENOUS

## 2016-09-29 MED ORDER — OXYCODONE-ACETAMINOPHEN 5-325 MG PO TABS: 1.0000 | ORAL_TABLET | ORAL | 0 refills | Status: DC | PRN

## 2016-09-29 MED ORDER — CHLORHEXIDINE GLUCONATE 4 % EX LIQD: 60.0000 mL | Freq: Once | CUTANEOUS | Status: DC

## 2016-09-29 MED ORDER — FENTANYL CITRATE (PF) 100 MCG/2ML IJ SOLN
INTRAMUSCULAR | Status: AC
  Administered 2016-09-29: 50 ug via INTRAVENOUS
  Filled 2016-09-29: qty 2

## 2016-09-29 MED ORDER — MIDAZOLAM HCL 2 MG/2ML IJ SOLN
INTRAMUSCULAR | Status: AC
  Administered 2016-09-29: 2 mg
  Filled 2016-09-29: qty 2

## 2016-09-29 MED ORDER — PROPOFOL 10 MG/ML IV BOLUS
INTRAVENOUS | Status: DC | PRN
  Administered 2016-09-29: 200 mg via INTRAVENOUS

## 2016-09-29 MED ORDER — LIDOCAINE HCL (CARDIAC) 20 MG/ML IV SOLN
INTRAVENOUS | Status: DC | PRN
  Administered 2016-09-29: 60 mg via INTRAVENOUS

## 2016-09-29 MED ORDER — OXYCODONE-ACETAMINOPHEN 5-325 MG PO TABS
1.0000 | ORAL_TABLET | Freq: Once | ORAL | Status: AC
  Administered 2016-09-29: 1 via ORAL

## 2016-09-29 MED ORDER — 0.9 % SODIUM CHLORIDE (POUR BTL) OPTIME
TOPICAL | Status: DC | PRN
  Administered 2016-09-29: 1000 mL

## 2016-09-29 MED ORDER — DEXAMETHASONE SODIUM PHOSPHATE 10 MG/ML IJ SOLN
INTRAMUSCULAR | Status: DC | PRN
  Administered 2016-09-29: 10 mg via INTRAVENOUS

## 2016-09-29 MED ORDER — FENTANYL CITRATE (PF) 100 MCG/2ML IJ SOLN
INTRAMUSCULAR | Status: AC
  Filled 2016-09-29: qty 2

## 2016-09-29 MED ORDER — PHENYLEPHRINE HCL 10 MG/ML IJ SOLN
INTRAMUSCULAR | Status: DC | PRN
  Administered 2016-09-29: 50 ug via INTRAVENOUS

## 2016-09-29 MED ORDER — CEFAZOLIN SODIUM-DEXTROSE 2-4 GM/100ML-% IV SOLN
2.0000 g | INTRAVENOUS | Status: DC
  Filled 2016-09-29: qty 100

## 2016-09-29 MED ORDER — FENTANYL CITRATE (PF) 100 MCG/2ML IJ SOLN
INTRAMUSCULAR | Status: AC
  Administered 2016-09-29: 100 ug
  Filled 2016-09-29: qty 2

## 2016-09-29 SURGICAL SUPPLY — 46 items
BLADE AVERAGE 25X9 (BLADE) IMPLANT
BLADE MINI RND TIP GREEN BEAV (BLADE) IMPLANT
BNDG COHESIVE 1X5 TAN STRL LF (GAUZE/BANDAGES/DRESSINGS) IMPLANT
BNDG COHESIVE 6X5 TAN STRL LF (GAUZE/BANDAGES/DRESSINGS) ×2 IMPLANT
BNDG ESMARK 4X9 LF (GAUZE/BANDAGES/DRESSINGS) ×2 IMPLANT
BNDG GAUZE ELAST 4 BULKY (GAUZE/BANDAGES/DRESSINGS) ×2 IMPLANT
BNDG GAUZE STRTCH 6 (GAUZE/BANDAGES/DRESSINGS) IMPLANT
CORDS BIPOLAR (ELECTRODE) ×2 IMPLANT
COTTON STERILE ROLL (GAUZE/BANDAGES/DRESSINGS) IMPLANT
COVER SURGICAL LIGHT HANDLE (MISCELLANEOUS) ×4 IMPLANT
CUFF TOURNIQUET SINGLE 18IN (TOURNIQUET CUFF) IMPLANT
CUFF TOURNIQUET SINGLE 24IN (TOURNIQUET CUFF) IMPLANT
DRAPE OEC MINIVIEW 54X84 (DRAPES) IMPLANT
DRAPE U-SHAPE 47X51 STRL (DRAPES) ×2 IMPLANT
DRSG ADAPTIC 3X8 NADH LF (GAUZE/BANDAGES/DRESSINGS) ×2 IMPLANT
DURAPREP 26ML APPLICATOR (WOUND CARE) ×2 IMPLANT
ELECT REM PT RETURN 9FT ADLT (ELECTROSURGICAL) ×2
ELECTRODE REM PT RTRN 9FT ADLT (ELECTROSURGICAL) ×1 IMPLANT
GAUZE SPONGE 2X2 8PLY STRL LF (GAUZE/BANDAGES/DRESSINGS) IMPLANT
GAUZE SPONGE 4X4 12PLY STRL (GAUZE/BANDAGES/DRESSINGS) IMPLANT
GLOVE BIOGEL PI IND STRL 9 (GLOVE) ×1 IMPLANT
GLOVE BIOGEL PI INDICATOR 9 (GLOVE) ×1
GLOVE SURG ORTHO 9.0 STRL STRW (GLOVE) ×2 IMPLANT
GOWN STRL REUS W/ TWL XL LVL3 (GOWN DISPOSABLE) ×2 IMPLANT
GOWN STRL REUS W/TWL XL LVL3 (GOWN DISPOSABLE) ×2
KIT BASIN OR (CUSTOM PROCEDURE TRAY) ×2 IMPLANT
KIT ROOM TURNOVER OR (KITS) ×2 IMPLANT
MANIFOLD NEPTUNE II (INSTRUMENTS) ×2 IMPLANT
NEEDLE HYPO 25GX1X1/2 BEV (NEEDLE) IMPLANT
NS IRRIG 1000ML POUR BTL (IV SOLUTION) ×2 IMPLANT
PACK ORTHO EXTREMITY (CUSTOM PROCEDURE TRAY) ×2 IMPLANT
PAD ARMBOARD 7.5X6 YLW CONV (MISCELLANEOUS) ×4 IMPLANT
PAD CAST 4YDX4 CTTN HI CHSV (CAST SUPPLIES) IMPLANT
PADDING CAST COTTON 4X4 STRL (CAST SUPPLIES)
SPECIMEN JAR SMALL (MISCELLANEOUS) ×2 IMPLANT
SPONGE GAUZE 2X2 STER 10/PKG (GAUZE/BANDAGES/DRESSINGS)
SPONGE GAUZE 4X4 12PLY STER LF (GAUZE/BANDAGES/DRESSINGS) ×2 IMPLANT
SUCTION FRAZIER HANDLE 10FR (MISCELLANEOUS)
SUCTION TUBE FRAZIER 10FR DISP (MISCELLANEOUS) IMPLANT
SUT ETHILON 3 0 FSLX (SUTURE) IMPLANT
SUT VIC AB 2-0 FS1 27 (SUTURE) IMPLANT
SYR CONTROL 10ML LL (SYRINGE) IMPLANT
TOWEL OR 17X24 6PK STRL BLUE (TOWEL DISPOSABLE) ×2 IMPLANT
TOWEL OR 17X26 10 PK STRL BLUE (TOWEL DISPOSABLE) ×2 IMPLANT
TUBE CONNECTING 12X1/4 (SUCTIONS) IMPLANT
WATER STERILE IRR 1000ML POUR (IV SOLUTION) ×2 IMPLANT

## 2016-09-29 NOTE — Transfer of Care (Signed)
Immediate Anesthesia Transfer of Care Note  Patient: Danielle Harrington  Procedure(s) Performed: Procedure(s): EXCISION MORTON'S NEUROMA LEFT FOOT 3RD WEB SPACE (Left)  Patient Location: PACU  Anesthesia Type:General and Regional  Level of Consciousness: awake, alert , oriented and patient cooperative  Airway & Oxygen Therapy: Patient Spontanous Breathing and Patient connected to nasal cannula oxygen  Post-op Assessment: Report given to RN and Post -op Vital signs reviewed and stable  Post vital signs: Reviewed and stable  Last Vitals:  Vitals:   09/29/16 1026  BP: (!) 145/94  Pulse: 79  Resp: 18  Temp: 36.9 C    Last Pain:  Vitals:   09/29/16 1032  TempSrc:   PainSc: 7       Patients Stated Pain Goal: 3 (62/69/48 5462)  Complications: No apparent anesthesia complications

## 2016-09-29 NOTE — H&P (Signed)
Danielle Harrington is an 40 y.o. female.   Chief Complaint: Morton's neuroma left foot third webspace HPI: Patient is a 40 year old woman who has undergone conservative treatment including injections for Morton's neuroma without relief and presents at this time for excision of the neuroma.  Past Medical History:  Diagnosis Date  . ADD (attention deficit disorder)   . Anxiety   . Arthritis    R knee, sciata treated - injection- 2012  . Complication of anesthesia    used scop. patch in the past  . Depression   . Family history of adverse reaction to anesthesia    N&V  . Headache    migraine, last one 3-4 months ago   . History of anemia   . History of bronchitis   . History of kidney stones   . Hypertension   . Insomnia    Ambien  . PONV (postoperative nausea and vomiting)     Past Surgical History:  Procedure Laterality Date  . APPENDECTOMY    . CESAREAN SECTION    . KNEE SURGERY Right 1995 & 2009   ACL repair & arthroscopy -2009  . LAPAROSCOPIC ENDOMETRIOSIS FULGURATION    . OPEN REDUCTION INTERNAL FIXATION (ORIF) FOOT LISFRANC FRACTURE Left 02/18/2016   Procedure: OPEN REDUCTION INTERNAL FIXATION (ORIF) FOOT LISFRANC FRACTURE;  Surgeon: Newt Minion, MD;  Location: [facility];  Service: Orthopedics;  Laterality: Left;  . TONSILLECTOMY    . ULNAR NERVE REPAIR    . WRIST SURGERY Right    torn cartilage     History reviewed. No pertinent family history. Social History:  reports that she has quit smoking. She smoked 0.50 packs per day. She has never used smokeless tobacco. She reports that she drinks alcohol. She reports that she does not use drugs.  Allergies:  Allergies  Allergen Reactions  . Adhesive [Tape] Itching and Rash    Please use "paper" tape    Medications Prior to Admission  Medication Sig Dispense Refill  . ALPRAZolam (XANAX) 0.5 MG tablet Take 0.5 mg by mouth daily as needed for anxiety (anxiety).   0  . buPROPion (WELLBUTRIN XL) 300 MG 24 hr tablet Take 300  mg by mouth daily before breakfast.   3  . citalopram (CELEXA) 20 MG tablet Take 20 mg by mouth at bedtime.   1  . folic acid (FOLVITE) 1 MG tablet Take 1 mg by mouth daily.  4  . gabapentin (NEURONTIN) 300 MG capsule Take 1 capsule (300 mg total) by mouth 3 (three) times daily. (Patient taking differently: Take 300 mg by mouth at bedtime. ) 90 capsule 2  . HYDROcodone-acetaminophen (NORCO/VICODIN) 5-325 MG tablet Take 1 tablet by mouth every 4 (four) hours as needed for moderate pain. 60 tablet 0  . ibuprofen (ADVIL,MOTRIN) 200 MG tablet Take 400 mg by mouth every 6 (six) hours as needed for moderate pain (pain).    Marland Kitchen levocetirizine (XYZAL) 5 MG tablet Take 5 mg by mouth every evening.  3  . Liniments (SALONPAS PAIN RELIEF PATCH EX) Apply 1 patch topically daily as needed (PAIN).    Marland Kitchen losartan-hydrochlorothiazide (HYZAAR) 100-12.5 MG per tablet Take 1 tablet by mouth daily.  3  . Multiple Vitamin (MULTIVITAMIN) tablet Take 1 tablet by mouth daily.    Marland Kitchen PROAIR HFA 108 (90 Base) MCG/ACT inhaler Inhale 2 puffs into the lungs every 4 (four) hours as needed for wheezing or shortness of breath.  0  . YASMIN 28 3-0.03 MG tablet Take 1  tablet by mouth daily.   1  . zolpidem (AMBIEN) 10 MG tablet Take 5-10 mg by mouth at bedtime. SLEEP DEPENDS ON DIFFICULTY SLEEPING  1  . fluconazole (DIFLUCAN) 150 MG tablet Take 150 mg by mouth once as needed for other. For uti..    . nabumetone (RELAFEN) 750 MG tablet Take 1 tablet (750 mg total) by mouth 2 (two) times daily. (Patient not taking: Reported on 09/19/2016) 60 tablet 2  . oxyCODONE-acetaminophen (PERCOCET) 10-325 MG tablet Take 1 tablet by mouth every 4 (four) hours as needed for pain. (Patient not taking: Reported on 09/19/2016) 60 tablet 0    No results found for this or any previous visit (from the past 48 hour(s)). No results found.  Review of Systems  All other systems reviewed and are negative.   Blood pressure (!) 145/94, pulse 79, temperature  98.4 F (36.9 C), temperature source Oral, resp. rate 18, height 5' 5"  (1.651 m), weight 154 lb (69.9 kg), last menstrual period 09/23/2016, SpO2 97 %. Physical Exam  On examination patient is alert oriented no adenopathy well-dressed normal affect normal wrist right effort she does have an antalgic gait she has good pulses she is point tender to palpation over the third webspace lateral compression reproduces a clunk and pain. Assessment/Plan Assessment: Left foot Morton's neuroma third webspace.  Plan: We'll plan for excision of the Morton's neuroma risk and benefits were discussed including persistent pain. Patient states she understands wish proceed at this time.  Newt Minion, MD 09/29/2016, 11:46 AM

## 2016-09-29 NOTE — Anesthesia Postprocedure Evaluation (Signed)
Anesthesia Post Note  Patient: Danielle Harrington  Procedure(s) Performed: Procedure(s) (LRB): EXCISION MORTON'S NEUROMA LEFT FOOT 3RD WEB SPACE (Left)  Patient location during evaluation: PACU Anesthesia Type: General Level of consciousness: sedated Pain management: satisfactory to patient Vital Signs Assessment: post-procedure vital signs reviewed and stable Respiratory status: spontaneous breathing Cardiovascular status: stable Anesthetic complications: no    Last Vitals:  Vitals:   09/29/16 1330 09/29/16 1345  BP: (!) 141/90 (!) 138/92  Pulse: 66 84  Resp: 16 17  Temp:      Last Pain:  Vitals:   09/29/16 1330  TempSrc:   PainSc: 5                  Meghan Tiemann EDWARD

## 2016-09-29 NOTE — Anesthesia Preprocedure Evaluation (Signed)
Anesthesia Evaluation  Patient identified by MRN, date of birth, ID band Patient awake    Reviewed: Allergy & Precautions, NPO status , Patient's Chart, lab work & pertinent test results  History of Anesthesia Complications (+) PONV  Airway Mallampati: II  TM Distance: >3 FB Neck ROM: Full    Dental  (+) Dental Advisory Given   Pulmonary Current Smoker, former smoker,    breath sounds clear to auscultation       Cardiovascular hypertension, Pt. on medications  Rhythm:Regular Rate:Normal     Neuro/Psych Anxiety Depression negative neurological ROS     GI/Hepatic negative GI ROS, Neg liver ROS,   Endo/Other  negative endocrine ROS  Renal/GU negative Renal ROS     Musculoskeletal  (+) Arthritis ,   Abdominal   Peds  Hematology negative hematology ROS (+)   Anesthesia Other Findings   Reproductive/Obstetrics                             Lab Results  Component Value Date   WBC 7.1 09/22/2016   HGB 11.8 (L) 09/22/2016   HCT 35.3 (L) 09/22/2016   MCV 93.1 09/22/2016   PLT 262 09/22/2016   Lab Results  Component Value Date   CREATININE 0.88 09/22/2016   BUN 17 09/22/2016   NA 136 09/22/2016   K 3.6 09/22/2016   CL 104 09/22/2016   CO2 25 09/22/2016    Anesthesia Physical  Anesthesia Plan  ASA: II  Anesthesia Plan: General and Regional   Post-op Pain Management: GA combined w/ Regional for post-op pain   Induction: Intravenous  Airway Management Planned: LMA  Additional Equipment:   Intra-op Plan:   Post-operative Plan: Extubation in OR  Informed Consent: I have reviewed the patients History and Physical, chart, labs and discussed the procedure including the risks, benefits and alternatives for the proposed anesthesia with the patient or authorized representative who has indicated his/her understanding and acceptance.   Dental advisory given  Plan Discussed with:  CRNA  Anesthesia Plan Comments:         Anesthesia Quick Evaluation

## 2016-09-29 NOTE — Op Note (Signed)
09/29/2016  12:43 PM  PATIENT:  Danielle Harrington    PRE-OPERATIVE DIAGNOSIS:  Morton's Neuroma Left Foot  POST-OPERATIVE DIAGNOSIS:  Same  PROCEDURE:  EXCISION MORTON'S NEUROMA LEFT FOOT 3RD WEB SPACE  SURGEON:  Newt Minion, MD  PHYSICIAN ASSISTANT:None ANESTHESIA:   General  PREOPERATIVE INDICATIONS:  Danielle Harrington is a  40 y.o. female with a diagnosis of Morton's Neuroma Left Foot who failed conservative measures and elected for surgical management.    The risks benefits and alternatives were discussed with the patient preoperatively including but not limited to the risks of infection, bleeding, nerve injury, cardiopulmonary complications, the need for revision surgery, among others, and the patient was willing to proceed.  OPERATIVE IMPLANTS: None  OPERATIVE FINDINGS: Large neuroma  OPERATIVE PROCEDURE: Patient brought the operating room and underwent a popliteal block. She then underwent a general anesthetic. After adequate levels anesthesia obtained patient's left lower extremity was prepped using DuraPrep draped into a sterile field a timeout was called. A dorsal incision was made over the third webspace. Blunt dissection was carried down to the transverse metatarsal ligament this was incised plantar pressure delivered the neuroma the normal was grasped with a Allis clip and the proximal and distal legs were resected with retraction. The full neuroma was excised. The wound was irrigated with normal saline hemostasis was obtained. The incision was closed using 2-0 nylon. A sterile dressing was applied. Patient was extubated taken the PACU in stable condition.

## 2016-09-29 NOTE — Anesthesia Procedure Notes (Addendum)
Anesthesia Regional Block:  Popliteal block  Pre-Anesthetic Checklist: ,, timeout performed, Correct Patient, Correct Site, Correct Laterality, Correct Procedure, Correct Position, site marked, Risks and benefits discussed, pre-op evaluation,  At surgeon's request and post-op pain management  Laterality: Left  Prep: chloraprep       Needles:   Needle Type: Echogenic Needle     Needle Length: 9cm 9 cm Needle Gauge: 21 and 21 G    Additional Needles:  Procedures: ultrasound guided (picture in chart) and other Popliteal block Narrative:  Start time: 09/29/2016 11:02 AM End time: 09/29/2016 11:14 AM Injection made incrementally with aspirations every 5 mL. Anesthesiologist: Lyndle Herrlich  Additional Notes: 25cc .5% Marcaine

## 2016-09-29 NOTE — Anesthesia Procedure Notes (Signed)
Procedure Name: Intubation Date/Time: 09/29/2016 12:27 PM Performed by: Oletta Lamas Pre-anesthesia Checklist: Patient identified, Emergency Drugs available, Suction available and Patient being monitored Patient Re-evaluated:Patient Re-evaluated prior to inductionOxygen Delivery Method: Circle System Utilized Preoxygenation: Pre-oxygenation with 100% oxygen Intubation Type: IV induction Ventilation: Mask ventilation without difficulty LMA: LMA inserted LMA Size: 4.0 Tube type: Oral Number of attempts: 1 Placement Confirmation: ETT inserted through vocal cords under direct vision,  positive ETCO2 and breath sounds checked- equal and bilateral Tube secured with: Tape Dental Injury: Teeth and Oropharynx as per pre-operative assessment

## 2016-09-30 ENCOUNTER — Encounter (HOSPITAL_COMMUNITY): Payer: Self-pay | Admitting: Orthopedic Surgery

## 2016-10-05 ENCOUNTER — Other Ambulatory Visit (HOSPITAL_COMMUNITY): Payer: Self-pay | Admitting: Orthopedic Surgery

## 2016-10-05 ENCOUNTER — Telehealth (INDEPENDENT_AMBULATORY_CARE_PROVIDER_SITE_OTHER): Payer: Self-pay | Admitting: Orthopedic Surgery

## 2016-10-05 NOTE — Telephone Encounter (Signed)
Pt requesting refill of percocet,  Pt number is [phone]

## 2016-10-05 NOTE — Telephone Encounter (Signed)
I called pt and she received Vicodin 5/324m #60 09/05/16, 09/18/16 and percocet 5/325 # 09/29/16 the pt is requesting more percocet.  I advised that the last rx that was written was if taken round the clock a 10 day supply and that it was too soon to fill.  Advised that I can make an appt with Dr. DSharol Givenfor Monday to discuss refill and this was sch. She has had 120 Vicodin and 60 percocet in 4 weeks. Advised of the conservative things that she can do to help with post surgical pain of mortons neuroma excision.

## 2016-10-09 ENCOUNTER — Ambulatory Visit (INDEPENDENT_AMBULATORY_CARE_PROVIDER_SITE_OTHER): Payer: Self-pay | Admitting: Orthopedic Surgery

## 2016-10-09 VITALS — Ht 65.0 in | Wt 154.0 lb

## 2016-10-09 DIAGNOSIS — G90522 Complex regional pain syndrome I of left lower limb: Secondary | ICD-10-CM

## 2016-10-09 DIAGNOSIS — G5762 Lesion of plantar nerve, left lower limb: Secondary | ICD-10-CM

## 2016-10-09 MED ORDER — OXYCODONE-ACETAMINOPHEN 5-325 MG PO TABS: 1.0000 | ORAL_TABLET | Freq: Three times a day (TID) | ORAL | 0 refills | Status: DC | PRN

## 2016-10-09 NOTE — Progress Notes (Signed)
Office Visit Note   Patient: Danielle Harrington           Date of Birth: November 04, 1975           MRN: [id number] Visit Date: 10/09/2016              Requested by: Donald Prose, MD [address] [place], [place] [address] PCP: Lynne Logan, MD   Assessment & Plan: Visit Diagnoses:  1. Morton neuroma, left   2. Complex regional pain syndrome i of left lower limb     Plan: Patient does have hypersensitivity to light touch consistent with complex regional pain syndrome type I. Recommend that she increase her to 300 mg 3 times a day she is currently on Celexa Wellbutrin and Xanax and Ambien do not want to add another SS or I medication. Patient also reports a history of being a pain clinic for her back which she took 300 tablets of 30 mg oxycodone a month. Recommended scar massage 3 times a day to desensitize her foot.  Follow-Up Instructions: Return in about 2 weeks (around 10/23/2016).   Orders:  No orders of the defined types were placed in this encounter.  Meds ordered this encounter  Medications  . oxyCODONE-acetaminophen (ROXICET) 5-325 MG tablet    Sig: Take 1 tablet by mouth every 8 (eight) hours as needed for severe pain.    Dispense:  60 tablet    Refill:  0      Procedures: No procedures performed   Clinical Data: No additional findings.   Subjective: Chief Complaint  Patient presents with  . Left Foot - Routine Post Op    09/29/16 excision morton's neuroma  Left foot 3rd web space.     Patient is requesting pain medication refill. She has had #120 Vicodin 5/325 in the past 4 weeks and Percocet #60 written 09/29/16. Patient is also taking Gabapentin, Celexa, xanax and Ambien for sleep. She is 10 days s/p a left foot morton's neuroma excision. She is non weight bearing with a knee scooter.     Review of Systems   Objective: Vital Signs: Ht 5' 5"  (1.651 m)   Wt 154 lb (69.9 kg)   LMP 09/23/2016   BMI 25.63 kg/m   Physical Exam examination patient  shows photographs which shows increased redness in her foot consistent with complex regional pain syndrome. Her foot is hypersensitive to light touch there is no redness no cellulitis no signs of acute changes today no signs of infection.  Ortho Exam  Specialty Comments:  No specialty comments available.  Imaging: No results found.   PMFS History: Patient Active Problem List   Diagnosis Date Noted  . Complex regional pain syndrome i of left lower limb 10/09/2016  . Morton neuroma, left    Past Medical History:  Diagnosis Date  . ADD (attention deficit disorder)   . Anxiety   . Arthritis    R knee, sciata treated - injection- 2012  . Complication of anesthesia    used scop. patch in the past  . Depression   . Family history of adverse reaction to anesthesia    N&V  . Headache    migraine, last one 3-4 months ago   . History of anemia   . History of bronchitis   . History of kidney stones   . Hypertension   . Insomnia    Ambien  . PONV (postoperative nausea and vomiting)     No family history on  file.  Past Surgical History:  Procedure Laterality Date  . APPENDECTOMY    . CESAREAN SECTION    . EXCISION MORTON'S NEUROMA Left 09/29/2016   Procedure: EXCISION MORTON'S NEUROMA LEFT FOOT 3RD WEB SPACE;  Surgeon: Newt Minion, MD;  Location: [facility];  Service: Orthopedics;  Laterality: Left;  . KNEE SURGERY Right 1995 & 2009   ACL repair & arthroscopy -2009  . LAPAROSCOPIC ENDOMETRIOSIS FULGURATION    . OPEN REDUCTION INTERNAL FIXATION (ORIF) FOOT LISFRANC FRACTURE Left 02/18/2016   Procedure: OPEN REDUCTION INTERNAL FIXATION (ORIF) FOOT LISFRANC FRACTURE;  Surgeon: Newt Minion, MD;  Location: [facility];  Service: Orthopedics;  Laterality: Left;  . TONSILLECTOMY    . ULNAR NERVE REPAIR    . WRIST SURGERY Right    torn cartilage    Social History   Occupational History  . Not on file.   Social History Main Topics  . Smoking status: Former Smoker    Packs/day: 0.50  .  Smokeless tobacco: Never Used     Comment: Sts she recently quit (08/30/16)  . Alcohol use Yes     Comment: socially- 1-2 drinks per week   . Drug use: No  . Sexual activity: Not on file

## 2016-10-10 ENCOUNTER — Telehealth (INDEPENDENT_AMBULATORY_CARE_PROVIDER_SITE_OTHER): Payer: Self-pay | Admitting: Orthopedic Surgery

## 2016-10-10 DIAGNOSIS — G90522 Complex regional pain syndrome I of left lower limb: Secondary | ICD-10-CM

## 2016-10-10 NOTE — Telephone Encounter (Signed)
Order sent to Dr. Kennon Portela clinic.

## 2016-10-10 NOTE — Telephone Encounter (Signed)
I called patient discussed that the description of CRPS is what we were discussing in the office. Discussed the importance of early aggressive treatment. She will call us on Thursday if she is not getting better on 300 mg of Neurontin 3 times a day we will increase this to 600 mg. I also recommended proceeding with a sympathetic block and I will set her up with Dr. Ernestina Patches for evaluation for a sympathetic block.

## 2016-10-10 NOTE — Telephone Encounter (Signed)
Pt was seen yesterday and stated her AVS says she has CRPS and she was not aware of this diagnosis? Pt requested a call back to talk about this dx. Pt number is [phone]

## 2016-10-12 ENCOUNTER — Inpatient Hospital Stay (INDEPENDENT_AMBULATORY_CARE_PROVIDER_SITE_OTHER): Payer: Managed Care, Other (non HMO) | Admitting: Orthopedic Surgery

## 2016-10-17 ENCOUNTER — Encounter (HOSPITAL_COMMUNITY): Payer: Self-pay | Admitting: Emergency Medicine

## 2016-10-17 ENCOUNTER — Emergency Department (HOSPITAL_COMMUNITY)
Admission: EM | Admit: 2016-10-17 | Discharge: 2016-10-17 | Disposition: A | Discharge status: Home / Self Care | Payer: Managed Care, Other (non HMO) | Attending: Emergency Medicine | Admitting: Emergency Medicine

## 2016-10-17 DIAGNOSIS — M792 Neuralgia and neuritis, unspecified: Secondary | ICD-10-CM | POA: Diagnosis not present

## 2016-10-17 DIAGNOSIS — M79672 Pain in left foot: Secondary | ICD-10-CM | POA: Diagnosis present

## 2016-10-17 DIAGNOSIS — Z87891 Personal history of nicotine dependence: Secondary | ICD-10-CM | POA: Diagnosis not present

## 2016-10-17 DIAGNOSIS — I10 Essential (primary) hypertension: Secondary | ICD-10-CM | POA: Diagnosis not present

## 2016-10-17 MED ORDER — HYDROMORPHONE HCL 2 MG/ML IJ SOLN
1.0000 mg | Freq: Once | INTRAMUSCULAR | Status: DC
  Filled 2016-10-17 (×2): qty 1

## 2016-10-17 MED ORDER — HYDROMORPHONE HCL 2 MG/ML IJ SOLN
1.0000 mg | Freq: Once | INTRAMUSCULAR | Status: AC
  Administered 2016-10-17: 1 mg via INTRAVENOUS

## 2016-10-17 MED ORDER — HYDROMORPHONE HCL 2 MG/ML IJ SOLN
1.0000 mg | Freq: Once | INTRAMUSCULAR | Status: AC
Start: 2016-10-17 — End: 2016-10-17
  Administered 2016-10-17: 1 mg via INTRAVENOUS

## 2016-10-17 NOTE — Discharge Instructions (Signed)
It was our pleasure to provide your ER care today - we hope that you feel better.  Follow up with orthopedic foot specialist tomorrow as planned.  Take motrin or aleve as need for pain.  You may also take your oxycodone as need for pain.  Return to ER if worse, new symptoms, fevers, increased swelling/redness, other concern.   You were given pain medication in the ER - no driving for the next 6 hours.

## 2016-10-17 NOTE — ED Provider Notes (Signed)
[facility] DEPT Provider Note   CSN: [id number] Arrival date & time: 10/17/16  1654   By signing my name below, I, Collene Leyden, attest that this documentation has been prepared under the direction and in the presence of Lajean Saver, MD. Electronically Signed: Collene Leyden, Scribe. 10/17/16. 6:55 PM.  History   Chief Complaint Chief Complaint  Patient presents with  . Foot Pain    HPI Comments: Danielle Harrington is a 40 y.o. female with a PMHx of HTN, who presents to the Emergency Department complaining of gradually worsening, intermittent foot pain that began two weeks ago. Patient reports he had surgery to his left foot 09/29/16. Patient states that pain has worsened since surgery. She describes the pain was worse than child birth. She reports her foot changes colors (black) when lying down. Patient reports taking 600 mg of gabapentin TID and oxycodone with no improvement in pain. She denies any problems with her foot prior to surgery.   The history is provided by the patient. No language interpreter was used.    Past Medical History:  Diagnosis Date  . ADD (attention deficit disorder)   . Anxiety   . Arthritis    R knee, sciata treated - injection- 2012  . Complication of anesthesia    used scop. patch in the past  . Depression   . Family history of adverse reaction to anesthesia    N&V  . Headache    migraine, last one 3-4 months ago   . History of anemia   . History of bronchitis   . History of kidney stones   . Hypertension   . Insomnia    Ambien  . PONV (postoperative nausea and vomiting)     Patient Active Problem List   Diagnosis Date Noted  . Complex regional pain syndrome i of left lower limb 10/09/2016  . Morton neuroma, left     Past Surgical History:  Procedure Laterality Date  . APPENDECTOMY    . CESAREAN SECTION    . EXCISION MORTON'S NEUROMA Left 09/29/2016   Procedure: EXCISION MORTON'S NEUROMA LEFT FOOT 3RD WEB SPACE;  Surgeon: Newt Minion, MD;  Location: [facility];  Service: Orthopedics;  Laterality: Left;  . KNEE SURGERY Right 1995 & 2009   ACL repair & arthroscopy -2009  . LAPAROSCOPIC ENDOMETRIOSIS FULGURATION    . OPEN REDUCTION INTERNAL FIXATION (ORIF) FOOT LISFRANC FRACTURE Left 02/18/2016   Procedure: OPEN REDUCTION INTERNAL FIXATION (ORIF) FOOT LISFRANC FRACTURE;  Surgeon: Newt Minion, MD;  Location: [facility];  Service: Orthopedics;  Laterality: Left;  . TONSILLECTOMY    . ULNAR NERVE REPAIR    . WRIST SURGERY Right    torn cartilage     OB History    No data available       Home Medications    Prior to Admission medications   Medication Sig Start Date End Date Taking? Authorizing Provider  ALPRAZolam Duanne Moron) 0.5 MG tablet Take 0.5 mg by mouth daily as needed for anxiety (anxiety).  12/22/14   Historical Provider, MD  buPROPion (WELLBUTRIN XL) 300 MG 24 hr tablet Take 300 mg by mouth daily before breakfast.  02/01/15   Historical Provider, MD  citalopram (CELEXA) 20 MG tablet Take 20 mg by mouth at bedtime.  01/13/15   Historical Provider, MD  fluconazole (DIFLUCAN) 150 MG tablet Take 150 mg by mouth once as needed for other. For uti.. 08/28/16   Historical Provider, MD  folic acid (FOLVITE) 1 MG tablet  Take 1 mg by mouth daily. 06/08/16   Historical Provider, MD  gabapentin (NEURONTIN) 300 MG capsule Take 1 capsule (300 mg total) by mouth 3 (three) times daily. Patient taking differently: Take 300 mg by mouth at bedtime.  08/31/16   Newt Minion, MD  HYDROcodone-acetaminophen (NORCO/VICODIN) 5-325 MG tablet Take 1 tablet by mouth every 4 (four) hours as needed for moderate pain. 09/18/16   Newt Minion, MD  ibuprofen (ADVIL,MOTRIN) 200 MG tablet Take 400 mg by mouth every 6 (six) hours as needed for moderate pain (pain).    Historical Provider, MD  levocetirizine (XYZAL) 5 MG tablet Take 5 mg by mouth every evening. 01/08/15   Historical Provider, MD  Liniments (SALONPAS PAIN RELIEF PATCH EX) Apply 1 patch topically  daily as needed (PAIN).    Historical Provider, MD  losartan-hydrochlorothiazide (HYZAAR) 100-12.5 MG per tablet Take 1 tablet by mouth daily. 01/08/15   Historical Provider, MD  Multiple Vitamin (MULTIVITAMIN) tablet Take 1 tablet by mouth daily.    Historical Provider, MD  oxyCODONE-acetaminophen (ROXICET) 5-325 MG tablet Take 1 tablet by mouth every 4 (four) hours as needed for severe pain. 09/29/16   Newt Minion, MD  oxyCODONE-acetaminophen (ROXICET) 5-325 MG tablet Take 1 tablet by mouth every 8 (eight) hours as needed for severe pain. 10/09/16   Newt Minion, MD  PROAIR HFA 108 [phone] Base) MCG/ACT inhaler Inhale 2 puffs into the lungs every 4 (four) hours as needed for wheezing or shortness of breath. 07/10/16   Historical Provider, MD  YASMIN 28 3-0.03 MG tablet Take 1 tablet by mouth daily.  01/05/15   Historical Provider, MD  zolpidem (AMBIEN) 10 MG tablet Take 5-10 mg by mouth at bedtime. SLEEP DEPENDS ON DIFFICULTY SLEEPING 01/11/15   Historical Provider, MD    Family History No family history on file.  Social History Social History  Substance Use Topics  . Smoking status: Former Smoker    Packs/day: 0.50  . Smokeless tobacco: Never Used     Comment: Sts she recently quit (08/30/16)  . Alcohol use Yes     Comment: socially- 1-2 drinks per week      Allergies   Adhesive [tape]   Review of Systems Review of Systems  All other systems reviewed and are negative.    Physical Exam Updated Vital Signs BP 150/93 (BP Location: Left Arm)   Pulse 92   Temp 98.9 F (37.2 C) (Oral)   Resp 20   LMP 09/23/2016   SpO2 96%   Physical Exam  Constitutional: She is oriented to person, place, and time. She appears well-developed and well-nourished. No distress.  HENT:  Head: Normocephalic and atraumatic.  Eyes: EOM are normal.  Neck: Normal range of motion.  Cardiovascular: Normal rate.   Pulmonary/Chest: Effort normal.  Abdominal: She exhibits no distension. There is no  tenderness.  Musculoskeletal: Normal range of motion.  Surgical wound base of 3rd toe healing without any sign of infection. Dp/pt 2+. Foot has normal color and normal warmth. Normal cap refill distally in toes. +hypesthesia of skin of foot, diffusely.   Neurological: She is alert and oriented to person, place, and time.  Skin: Skin is warm and dry.  Psychiatric: She has a normal mood and affect. Judgment normal.  Nursing note and vitals reviewed.    ED Treatments / Results  DIAGNOSTIC STUDIES: Oxygen Saturation is 96% on RA, normal by my interpretation.    COORDINATION OF CARE: 6:55 PM Discussed treatment plan  with pt at bedside and pt agreed to plan.  Labs (all labs ordered are listed, but only abnormal results are displayed) Labs Reviewed - No data to display  EKG  EKG Interpretation None       Radiology No results found.  Procedures Procedures (including critical care time)  Medications Ordered in ED Medications - No data to display   Initial Impression / Assessment and Plan / ED Course  I have reviewed the triage vital signs and the nursing notes.  Pertinent labs & imaging results that were available during my care of the patient were reviewed by me and considered in my medical decision making (see chart for details).  Clinical Course     Patient requests pain med in ED to help with pain.   Patient appears to have developed chronic pain in foot over course of past many months, not relieved w recent surgery.  Dilaudid 1 mg im for symptom relief in ED.  Pt has ride, does not have to drive.  Patient indicates has f/u tomorrow w ortho/[facility] for 2nd opinion.   Discussed need f/u her orthopedist, and possibly pain management for long term management of pain.   Final Clinical Impressions(s) / ED Diagnoses   Final diagnoses:  None    New Prescriptions New Prescriptions   No medications on file   I personally performed the services described in this  documentation, which was scribed in my presence. The recorded information has been reviewed and considered. Lajean Saver, MD    Lajean Saver, MD 10/17/16 [phone]

## 2016-10-17 NOTE — ED Triage Notes (Addendum)
Pt reports surgery two and a half weeks ago to left foot. Pt reports pain has gotten worse, painful to touch. Pt being seen by MD Sharol Given but his office is closed today. Pt is having no relief with Neurontin and oxycodone. Pt here for pain relief. Pt reports she is waiting on insurance to approve a nerve block.

## 2016-10-20 ENCOUNTER — Other Ambulatory Visit (INDEPENDENT_AMBULATORY_CARE_PROVIDER_SITE_OTHER): Payer: Self-pay | Admitting: Family

## 2016-10-20 ENCOUNTER — Telehealth (INDEPENDENT_AMBULATORY_CARE_PROVIDER_SITE_OTHER): Payer: Self-pay | Admitting: Radiology

## 2016-10-20 NOTE — Telephone Encounter (Signed)
I called patient and advised.

## 2016-10-20 NOTE — Telephone Encounter (Signed)
No, taking tramadol with her celexa is a high risk for seizures. She will need to continue with her percocet. Has enough of this to last to upcoming appointment on 1/3

## 2016-10-20 NOTE — Telephone Encounter (Signed)
Patient called asking for update on referral to Dr. Ernestina Patches. I spoke with Loma Sousa and advised that her insurance had required more info and that has been sent.  Patient would like to know if you can send in Tramadol for her to [facility] on [address].  She did not realize that you changed the sig on her other Rx. She would rather not take an opiate right now. Please advise.

## 2016-10-25 ENCOUNTER — Encounter (INDEPENDENT_AMBULATORY_CARE_PROVIDER_SITE_OTHER): Payer: Self-pay | Admitting: Orthopedic Surgery

## 2016-10-25 ENCOUNTER — Ambulatory Visit (INDEPENDENT_AMBULATORY_CARE_PROVIDER_SITE_OTHER): Payer: Self-pay | Admitting: Orthopedic Surgery

## 2016-10-25 DIAGNOSIS — G90522 Complex regional pain syndrome I of left lower limb: Secondary | ICD-10-CM

## 2016-10-25 MED ORDER — NITROGLYCERIN 0.2 MG/HR TD PT24: 0.2000 mg | MEDICATED_PATCH | Freq: Every day | TRANSDERMAL | 12 refills | Status: DC

## 2016-10-25 MED ORDER — OXYCODONE-ACETAMINOPHEN 5-325 MG PO TABS: 1.0000 | ORAL_TABLET | ORAL | 0 refills | Status: DC | PRN

## 2016-10-25 MED ORDER — OXYCODONE-ACETAMINOPHEN 5-325 MG PO TABS: 1.0000 | ORAL_TABLET | Freq: Three times a day (TID) | ORAL | 0 refills | Status: DC | PRN

## 2016-10-25 NOTE — Addendum Note (Signed)
Addended by: Meridee Score on: 10/25/2016 02:47 PM   Modules accepted: Orders

## 2016-10-25 NOTE — Progress Notes (Addendum)
Office Visit Note   Patient: Danielle Harrington           Date of Birth: 12/25/1975           MRN: [id number] Visit Date: 10/25/2016              Requested by: Donald Prose, MD [facility] [place], [place] [address] PCP: Lynne Logan, MD   Assessment & Plan: Visit Diagnoses:  1. Complex regional pain syndrome i of left lower limb     Plan: Patient is currently on to SSRIs would be reluctant to add Elavil to this. She also has Ambien and Xanax. She has taken Neurontin 600 mg 3 times a day. We will add a nitroglycerin patch to try to help improve the microcirculation. Patient is set up for physical therapy for mobilization range of motion tissue mobilization as well as modalities including iontophoresis and contrast baths. Follow-up in 2 weeks. Prescription refill for Percocet and prescription for nitroglycerin patches.  Follow-Up Instructions: Return in about 2 weeks (around 11/08/2016).   Orders:  No orders of the defined types were placed in this encounter.  Meds ordered this encounter  Medications  . DISCONTD: oxyCODONE-acetaminophen (ROXICET) 5-325 MG tablet    Sig: Take 1 tablet by mouth every 8 (eight) hours as needed for severe pain.    Dispense:  60 tablet    Refill:  0  . nitroGLYCERIN (NITRODUR - DOSED IN MG/24 HR) 0.2 mg/hr patch    Sig: Place 1 patch (0.2 mg total) onto the skin daily.    Dispense:  30 patch    Refill:  12  . oxyCODONE-acetaminophen (PERCOCET/ROXICET) 5-325 MG tablet    Sig: Take 1 tablet by mouth every 4 (four) hours as needed for severe pain.    Dispense:  60 tablet    Refill:  0      Procedures: No procedures performed   Clinical Data: No additional findings.   Subjective: Chief Complaint  Patient presents with  . Left Foot - Routine Post Op    Left foot excision morton's neuroma left foot 3rd web space 09/29/16. Patient is 3 weeks 5 days post op. Patient has complex regional pain syndrome as well and is scheduled for a  block tomorrow. She is nonwtb with kneeling scooter.    HPI patient states she did go see Dr. Doran Durand and he agreed with the current plan and treatment. She states that she did try some type of topical ointment and she developed a rash over her leg and foot. This is improving. Patient states she is unable to return to work at this time and is currently working from home.  Review of Systems   Objective: Vital Signs: There were no vitals taken for this visit.  Physical Exam on examination there is increased redness in the left foot compared to the right there is some mild swelling there is no cellulitis no signs of infection. Patient does have hypersensitivity to light touch. The temperature is almost equal. Patient does have some residual petechial rash from her allergic reaction from topical ointments.  Ortho Exam  Specialty Comments:  No specialty comments available.  Imaging: No results found.   PMFS History: Patient Active Problem List   Diagnosis Date Noted  . Complex regional pain syndrome i of left lower limb 10/09/2016  . Morton neuroma, left    Past Medical History:  Diagnosis Date  . ADD (attention deficit disorder)   . Anxiety   .  Arthritis    R knee, sciata treated - injection- 2012  . Complication of anesthesia    used scop. patch in the past  . Depression   . Family history of adverse reaction to anesthesia    N&V  . Headache    migraine, last one 3-4 months ago   . History of anemia   . History of bronchitis   . History of kidney stones   . Hypertension   . Insomnia    Ambien  . PONV (postoperative nausea and vomiting)     History reviewed. No pertinent family history.  Past Surgical History:  Procedure Laterality Date  . APPENDECTOMY    . CESAREAN SECTION    . EXCISION MORTON'S NEUROMA Left 09/29/2016   Procedure: EXCISION MORTON'S NEUROMA LEFT FOOT 3RD WEB SPACE;  Surgeon: Newt Minion, MD;  Location: [facility];  Service: Orthopedics;  Laterality:  Left;  . KNEE SURGERY Right 1995 & 2009   ACL repair & arthroscopy -2009  . LAPAROSCOPIC ENDOMETRIOSIS FULGURATION    . OPEN REDUCTION INTERNAL FIXATION (ORIF) FOOT LISFRANC FRACTURE Left 02/18/2016   Procedure: OPEN REDUCTION INTERNAL FIXATION (ORIF) FOOT LISFRANC FRACTURE;  Surgeon: Newt Minion, MD;  Location: [facility];  Service: Orthopedics;  Laterality: Left;  . TONSILLECTOMY    . ULNAR NERVE REPAIR    . WRIST SURGERY Right    torn cartilage    Social History   Occupational History  . Not on file.   Social History Main Topics  . Smoking status: Former Smoker    Packs/day: 0.50  . Smokeless tobacco: Never Used     Comment: Sts she recently quit (08/30/16)  . Alcohol use Yes     Comment: socially- 1-2 drinks per week   . Drug use: No  . Sexual activity: Not on file

## 2016-10-26 ENCOUNTER — Encounter (INDEPENDENT_AMBULATORY_CARE_PROVIDER_SITE_OTHER): Payer: Self-pay | Admitting: Physical Medicine and Rehabilitation

## 2016-10-26 ENCOUNTER — Ambulatory Visit (INDEPENDENT_AMBULATORY_CARE_PROVIDER_SITE_OTHER): Payer: Managed Care, Other (non HMO) | Admitting: Physical Medicine and Rehabilitation

## 2016-10-26 VITALS — BP 151/103

## 2016-10-26 DIAGNOSIS — G90522 Complex regional pain syndrome I of left lower limb: Secondary | ICD-10-CM | POA: Diagnosis not present

## 2016-10-26 NOTE — Patient Instructions (Signed)

## 2016-10-26 NOTE — Progress Notes (Signed)
Danielle Harrington - 41 y.o. female MRN [id number]  Date of birth: [birth year]  Office Visit Note: Visit Date: 10/26/2016 PCP: Lynne Logan, MD Referred by: Donald Prose, MD  Subjective: Chief Complaint  Patient presents with  . Left Leg - Pain, Weakness, Numbness   HPI: Danielle Harrington is a 41 year old female who originally had a fixation for a Lisfranc fracture by Dr. Sharol Given in the left foot. He has been following her and she now has complex regional pain syndrome in the left foot. He is manage this medically but does request a sympathetic block of the lumbar ganglions. She reports constant left foot pain. Occassionally radiates into ankle and calf. Also has numbness and tingling. Constantly keeps propped up. She reports color changes as well as swelling.    ROS Otherwise per HPI.  Assessment & Plan: Visit Diagnoses:  1. Complex regional pain syndrome i of left lower limb     Plan: Findings:  Plan is for left lumbar sympathetic block. She should continue with current medications and aggressive physical therapy. Depending on her outcome she may be a good referral to [facility].    Meds & Orders:  Meds ordered this encounter  Medications  . lidocaine (PF) (XYLOCAINE) 1 % injection 0.3 mL  . bupivacaine (MARCAINE) 0.25 % (with pres) injection 10 mL  . dexamethasone (DECADRON) injection 15 mg    Orders Placed This Encounter  Procedures  . Nerve Block    Follow-up: Return for Scheduled appointment with Dr. Sharol Given.   Procedures: No procedures performed  Lumbar Sympathetic Block with Fluoroscopic Guidance  Patient: Danielle Harrington      Date of Birth: 1976-04-30 MRN: [id number] PCP: Lynne Logan, MD      Visit Date: 10/26/2016   Universal Protocol:    Date/Time: 01/05/186:05 AM  Consent Given By: the patient  Position: PRONE  Additional Comments: Vital signs were monitored before and after the procedure. Patient was prepped and draped in the usual sterile  fashion. The correct patient, procedure, and site was verified.   Injection Procedure Details:  Procedure Site One Meds Administered:  Meds ordered this encounter  Medications  . lidocaine (PF) (XYLOCAINE) 1 % injection 0.3 mL  . bupivacaine (MARCAINE) 0.25 % (with pres) injection 10 mL  . dexamethasone (DECADRON) injection 15 mg     Laterality: Left  Location/Site:  L3-L4  Needle size: 22 G  Needle type: spinal needle  Needle Placement: Just ventral to the dorsal edge of the vertebral body on the lateral view and in the upper one third of the vertebral body  Findings:  -Contrast Used: 2 mL iohexol 180 mg iodine/mL   -Comments: There was excellent flow of contrast up and down the vertebral body typical for spread for sympathetic block without any spread in the muscle.  Procedure Details: The fluoroscope beam was manipulated to square off the endplates of the L2 vertebral body to achieve a true midline AP view.  An oblique view of the region overlying the inferior portion of the L2 vertebral body with the ipsilateral tip of the transverse process aligned with the ventral edge of the vertebral body was obtained under fluoroscopic visualization.  For the target described the skin was anesthetized with 1 ml of 1% Lidocaine without epinephrine. The spinal needle was inserted down to the inferior anterolateral aspect of the L2 vertebral body using biplanar imaging.   A 26m volume of Omnipaque-240 was injected to make sure that the needle tip was not  in a blood vessel and a standard fascial plane outline was obtained. Bi-planar imaging confirmed placement and appropriate images documented.  The solution noted above was injected in this region.  Lower limb temperatures were measured pre- and post- procedure using the temperature probe on a Cadwell electrodiagnostic machine.   Additional Comments:  The patient tolerated the procedure well No complications occurred Dressing: Band-Aid and  2x2 sterile gauze     Post-procedure details: Patient was observed during the procedure. Post-procedure instructions were reviewed. Patient left the clinic in stable condition.   Clinical History: No specialty comments available.  She reports that she has quit smoking. She smoked 0.50 packs per day. She has never used smokeless tobacco. No results for input(s): HGBA1C, LABURIC in the last 8760 hours.  Objective:  VS:  HT:    WT:   BMI:     BP:(!) 151/103  HR: bpm  TEMP: ( )  RESP:  Physical Exam  Musculoskeletal:  Examination of left foot shows well-healed surgical scar with swelling there is some red discoloration around the dorsal part of this. She does have some increased sweating. There is some coolness to the limb compared to the right. She can't actively move the toes and foot.    Ortho Exam Imaging: No results found.  Past Medical/Family/Surgical/Social History: Medications & Allergies reviewed per EMR Patient Active Problem List   Diagnosis Date Noted  . Complex regional pain syndrome i of left lower limb 10/09/2016  . Morton neuroma, left    Past Medical History:  Diagnosis Date  . ADD (attention deficit disorder)   . Anxiety   . Arthritis    R knee, sciata treated - injection- 2012  . Complication of anesthesia    used scop. patch in the past  . Depression   . Family history of adverse reaction to anesthesia    N&V  . Headache    migraine, last one 3-4 months ago   . History of anemia   . History of bronchitis   . History of kidney stones   . Hypertension   . Insomnia    Ambien  . PONV (postoperative nausea and vomiting)    History reviewed. No pertinent family history. Past Surgical History:  Procedure Laterality Date  . APPENDECTOMY    . CESAREAN SECTION    . EXCISION MORTON'S NEUROMA Left 09/29/2016   Procedure: EXCISION MORTON'S NEUROMA LEFT FOOT 3RD WEB SPACE;  Surgeon: Newt Minion, MD;  Location: [place];  Service: Orthopedics;  Laterality:  Left;  . KNEE SURGERY Right 1995 & 2009   ACL repair & arthroscopy -2009  . LAPAROSCOPIC ENDOMETRIOSIS FULGURATION    . OPEN REDUCTION INTERNAL FIXATION (ORIF) FOOT LISFRANC FRACTURE Left 02/18/2016   Procedure: OPEN REDUCTION INTERNAL FIXATION (ORIF) FOOT LISFRANC FRACTURE;  Surgeon: Newt Minion, MD;  Location: [place];  Service: Orthopedics;  Laterality: Left;  . TONSILLECTOMY    . ULNAR NERVE REPAIR    . WRIST SURGERY Right    torn cartilage    Social History   Occupational History  . Not on file.   Social History Main Topics  . Smoking status: Former Smoker    Packs/day: 0.50  . Smokeless tobacco: Never Used     Comment: Sts she recently quit (08/30/16)  . Alcohol use Yes     Comment: socially- 1-2 drinks per week   . Drug use: No  . Sexual activity: Not on file

## 2016-10-27 MED ORDER — BUPIVACAINE HCL 0.25 % IJ SOLN: 10.0000 mL | Freq: Once | INTRAMUSCULAR | Status: DC

## 2016-10-27 MED ORDER — DEXAMETHASONE SODIUM PHOSPHATE 10 MG/ML IJ SOLN: 15.0000 mg | Freq: Once | INTRAMUSCULAR | Status: DC

## 2016-10-27 MED ORDER — LIDOCAINE HCL (PF) 1 % IJ SOLN: 0.3300 mL | Freq: Once | INTRAMUSCULAR | Status: DC

## 2016-10-27 NOTE — Procedures (Signed)
Lumbar Sympathetic Block with Fluoroscopic Guidance  Patient: Danielle Harrington      Date of Birth: 1976-05-09 MRN: [id number] PCP: Lynne Logan, MD      Visit Date: 10/26/2016   Universal Protocol:    Date/Time: 01/05/186:05 AM  Consent Given By: the patient  Position: PRONE  Additional Comments: Vital signs were monitored before and after the procedure. Patient was prepped and draped in the usual sterile fashion. The correct patient, procedure, and site was verified.   Injection Procedure Details:  Procedure Site One Meds Administered:  Meds ordered this encounter  Medications  . lidocaine (PF) (XYLOCAINE) 1 % injection 0.3 mL  . bupivacaine (MARCAINE) 0.25 % (with pres) injection 10 mL  . dexamethasone (DECADRON) injection 15 mg     Laterality: Left  Location/Site:  L3-L4  Needle size: 22 G  Needle type: spinal needle  Needle Placement: Just ventral to the dorsal edge of the vertebral body on the lateral view and in the upper one third of the vertebral body  Findings:  -Contrast Used: 2 mL iohexol 180 mg iodine/mL   -Comments: There was excellent flow of contrast up and down the vertebral body typical for spread for sympathetic block without any spread in the muscle.  Procedure Details: The fluoroscope beam was manipulated to square off the endplates of the L2 vertebral body to achieve a true midline AP view.  An oblique view of the region overlying the inferior portion of the L2 vertebral body with the ipsilateral tip of the transverse process aligned with the ventral edge of the vertebral body was obtained under fluoroscopic visualization.  For the target described the skin was anesthetized with 1 ml of 1% Lidocaine without epinephrine. The spinal needle was inserted down to the inferior anterolateral aspect of the L2 vertebral body using biplanar imaging.   A 34m volume of Omnipaque-240 was injected to make sure that the needle tip was not in a blood vessel and a  standard fascial plane outline was obtained. Bi-planar imaging confirmed placement and appropriate images documented.  The solution noted above was injected in this region.  Lower limb temperatures were measured pre- and post- procedure using the temperature probe on a Cadwell electrodiagnostic machine.   Additional Comments:  The patient tolerated the procedure well No complications occurred Dressing: Band-Aid and 2x2 sterile gauze     Post-procedure details: Patient was observed during the procedure. Post-procedure instructions were reviewed. Patient left the clinic in stable condition.

## 2016-11-02 ENCOUNTER — Other Ambulatory Visit (INDEPENDENT_AMBULATORY_CARE_PROVIDER_SITE_OTHER): Payer: Self-pay | Admitting: Orthopedic Surgery

## 2016-11-03 ENCOUNTER — Other Ambulatory Visit (INDEPENDENT_AMBULATORY_CARE_PROVIDER_SITE_OTHER): Payer: Self-pay | Admitting: Orthopedic Surgery

## 2016-11-03 MED ORDER — OXYCODONE-ACETAMINOPHEN 5-325 MG PO TABS: 1.0000 | ORAL_TABLET | ORAL | 0 refills | Status: DC | PRN

## 2016-11-09 ENCOUNTER — Ambulatory Visit (INDEPENDENT_AMBULATORY_CARE_PROVIDER_SITE_OTHER): Payer: Managed Care, Other (non HMO) | Admitting: Orthopedic Surgery

## 2016-11-11 ENCOUNTER — Encounter (INDEPENDENT_AMBULATORY_CARE_PROVIDER_SITE_OTHER): Payer: Self-pay | Admitting: Orthopedic Surgery

## 2016-11-11 ENCOUNTER — Ambulatory Visit (INDEPENDENT_AMBULATORY_CARE_PROVIDER_SITE_OTHER): Payer: Self-pay | Admitting: Orthopedic Surgery

## 2016-11-11 DIAGNOSIS — G90522 Complex regional pain syndrome I of left lower limb: Secondary | ICD-10-CM

## 2016-11-11 MED ORDER — GABAPENTIN 300 MG PO CAPS: 600.0000 mg | ORAL_CAPSULE | Freq: Three times a day (TID) | ORAL | 3 refills | Status: DC

## 2016-11-11 MED ORDER — OXYCODONE-ACETAMINOPHEN 5-325 MG PO TABS: 1.0000 | ORAL_TABLET | Freq: Four times a day (QID) | ORAL | 0 refills | Status: DC | PRN

## 2016-11-11 NOTE — Addendum Note (Signed)
Addended by: Meridee Score on: 11/11/2016 10:06 AM   Modules accepted: Orders

## 2016-11-11 NOTE — Progress Notes (Addendum)
Office Visit Note   Patient: Danielle Harrington           Date of Birth: 1976/07/09           MRN: [id number] Visit Date: 11/11/2016              Requested by: Donald Prose, MD [address], [place] [address] PCP: Lynne Logan, MD  Chief Complaint  Patient presents with  . Left Foot - Routine Post Op    YOV:ZCHYIFO states she had good relief with her first sympathetic block with Dr. Ernestina Patches on 10/26/2016. She states the pain comes and goes now but she states it's now painful again with the snow. She uses regular shoe wear within the house and uses her kneeling scooter outside the home. HPI  Assessment & Plan: Visit Diagnoses:  1. Complex regional pain syndrome i of left lower limb     Plan: Continue with the Neurontin 600 mg 3 times a day continue with the Percocet we will set her up for a new sympathetic block. Continue with the desensitization with massage increase her weightbearing.  Follow-Up Instructions: Return in about 3 weeks (around 12/02/2016).   Ortho Exam Examination patient's skin color has returned to normal. There is no keloid changes to the scars. She has no pain to light touch the temperature is normal there is no increased moisture or dryness to the skin. With increased pressure on the plantar aspect this is painful. She has good range of motion of the ankle.  Imaging: No results found.  Orders:  Orders Placed This Encounter  Procedures  . Ambulatory referral to Physical Medicine Rehab   Meds ordered this encounter  Medications  . gabapentin (NEURONTIN) 300 MG capsule    Sig: Take 2 capsules (600 mg total) by mouth 3 (three) times daily. 3 times a day when necessary neuropathy pain    Dispense:  180 capsule    Refill:  3  . oxyCODONE-acetaminophen (ROXICET) 5-325 MG tablet    Sig: Take 1 tablet by mouth every 6 (six) hours as needed for severe pain.    Dispense:  40 tablet    Refill:  0     Procedures: No procedures  performed  Clinical Data: No additional findings.  Subjective: Review of Systems  Objective: Vital Signs: There were no vitals taken for this visit.  Specialty Comments:  No specialty comments available.  PMFS History: Patient Active Problem List   Diagnosis Date Noted  . Complex regional pain syndrome i of left lower limb 10/09/2016  . Morton neuroma, left    Past Medical History:  Diagnosis Date  . ADD (attention deficit disorder)   . Anxiety   . Arthritis    R knee, sciata treated - injection- 2012  . Complication of anesthesia    used scop. patch in the past  . Depression   . Family history of adverse reaction to anesthesia    N&V  . Headache    migraine, last one 3-4 months ago   . History of anemia   . History of bronchitis   . History of kidney stones   . Hypertension   . Insomnia    Ambien  . PONV (postoperative nausea and vomiting)     No family history on file.  Past Surgical History:  Procedure Laterality Date  . APPENDECTOMY    . CESAREAN SECTION    . EXCISION MORTON'S NEUROMA Left 09/29/2016   Procedure: EXCISION MORTON'S NEUROMA  LEFT FOOT 3RD WEB SPACE;  Surgeon: Newt Minion, MD;  Location: [place];  Service: Orthopedics;  Laterality: Left;  . KNEE SURGERY Right 1995 & 2009   ACL repair & arthroscopy -2009  . LAPAROSCOPIC ENDOMETRIOSIS FULGURATION    . OPEN REDUCTION INTERNAL FIXATION (ORIF) FOOT LISFRANC FRACTURE Left 02/18/2016   Procedure: OPEN REDUCTION INTERNAL FIXATION (ORIF) FOOT LISFRANC FRACTURE;  Surgeon: Newt Minion, MD;  Location: [place];  Service: Orthopedics;  Laterality: Left;  . TONSILLECTOMY    . ULNAR NERVE REPAIR    . WRIST SURGERY Right    torn cartilage    Social History   Occupational History  . Not on file.   Social History Main Topics  . Smoking status: Former Smoker    Packs/day: 0.50  . Smokeless tobacco: Never Used     Comment: Sts she recently quit (08/30/16)  . Alcohol use Yes     Comment: socially- 1-2  drinks per week   . Drug use: No  . Sexual activity: Not on file

## 2016-11-14 NOTE — Addendum Note (Signed)
Addendum  created 11/14/16 1319 by Oletta Lamas, CRNA   Anesthesia Event deleted, Anesthesia Event edited, Anesthesia Intra Flowsheets edited, Anesthesia Staff edited

## 2016-11-16 ENCOUNTER — Other Ambulatory Visit (INDEPENDENT_AMBULATORY_CARE_PROVIDER_SITE_OTHER): Payer: Self-pay | Admitting: Family

## 2016-11-16 DIAGNOSIS — G90529 Complex regional pain syndrome I of unspecified lower limb: Secondary | ICD-10-CM

## 2016-11-16 MED ORDER — OXYCODONE-ACETAMINOPHEN 5-325 MG PO TABS: 1.0000 | ORAL_TABLET | ORAL | 0 refills | Status: DC | PRN

## 2016-11-17 ENCOUNTER — Encounter (INDEPENDENT_AMBULATORY_CARE_PROVIDER_SITE_OTHER): Payer: Self-pay | Admitting: Physical Medicine and Rehabilitation

## 2016-11-17 ENCOUNTER — Ambulatory Visit (INDEPENDENT_AMBULATORY_CARE_PROVIDER_SITE_OTHER): Payer: Managed Care, Other (non HMO) | Admitting: Physical Medicine and Rehabilitation

## 2016-11-17 ENCOUNTER — Ambulatory Visit (INDEPENDENT_AMBULATORY_CARE_PROVIDER_SITE_OTHER): Payer: Self-pay

## 2016-11-17 VITALS — BP 108/74 | HR 89

## 2016-11-17 DIAGNOSIS — G90522 Complex regional pain syndrome I of left lower limb: Secondary | ICD-10-CM | POA: Diagnosis not present

## 2016-11-17 MED ORDER — BUPIVACAINE HCL 0.5 % IJ SOLN
50.0000 mL | Freq: Once | INTRAMUSCULAR | Status: AC
  Administered 2016-11-17: 50 mL

## 2016-11-17 MED ORDER — LIDOCAINE HCL (PF) 1 % IJ SOLN
0.3300 mL | Freq: Once | INTRAMUSCULAR | Status: AC
  Administered 2016-11-17: 0.3 mL

## 2016-11-17 NOTE — Procedures (Signed)
Lumbar Sympathetic Block with Fluoroscopic Guidance  Patient: Danielle Harrington      Date of Birth: [birth year] MRN: [id number] PCP: Lynne Logan, MD      Visit Date: 11/17/2016   Universal Protocol:    Date/Time: 11/17/1808:03 AM  Consent Given By: the patient  Position: PRONE  Additional Comments: Vital signs were monitored before and after the procedure. Patient was prepped and draped in the usual sterile fashion. The correct patient, procedure, and site was verified.   Injection Procedure Details:  Procedure Site One Meds Administered:  Meds ordered this encounter  Medications  . lidocaine (PF) (XYLOCAINE) 1 % injection 0.3 mL  . bupivacaine (MARCAINE) 0.5 % (with pres) injection 50 mL     Laterality: Left  Location/Site:  L2-L3  Needle size: 22 G  Needle type: spinal needle  Needle Placement: As described below, left lateral along the lower border of the L2 vertebral body  Findings:  -Contrast Used: 2 mL iohexol 180 mg iodine/mL   -Comments: Flow of contrast did not show any vascular uptake and was a well positioned area for lumbar sympathetic block without any evidence of muscular injection. the patient did have an excellent block with increased temperature and erythema on the left lower extremity.  Procedure Details: The fluoroscope beam was manipulated to square off the endplates of the L2 vertebral body to achieve a true midline AP view.  An oblique view of the region overlying the inferior portion of the L2 vertebral body with the ipsilateral tip of the transverse process aligned with the ventral edge of the vertebral body was obtained under fluoroscopic visualization.  For the target described the skin was anesthetized with 1 ml of 1% Lidocaine without epinephrine. The spinal needle was inserted down to the inferior anterolateral aspect of the L2 vertebral body using biplanar imaging.   A 78m volume of Omnipaque-240 was injected to make sure that the needle tip  was not in a blood vessel and a standard fascial plane outline was obtained. Bi-planar imaging confirmed placement and appropriate images documented.  The solution noted above was injected in this region.  Lower limb temperatures were measured pre- and post- procedure using the temperature probe on a Cadwell electrodiagnostic machine.   Additional Comments:  The patient tolerated the procedure well Dressing: Band-Aid    Post-procedure details: Patient was observed during the procedure. Post-procedure instructions were reviewed.  Patient left the clinic in stable condition.

## 2016-11-17 NOTE — Progress Notes (Signed)
Danielle Harrington - 41 y.o. female MRN [id number]  Date of birth: 1976/09/01  Office Visit Note: Visit Date: 11/17/2016 PCP: Lynne Logan, MD Referred by: Donald Prose, MD  Subjective: Chief Complaint  Patient presents with  . Left Foot - Pain   HPI: Danielle Harrington is a 41 year old female who originally had a fixation for a Lisfranc fracture by Dr. Sharol Given in the left foot. He has been following her and she now has complex regional pain syndrome in the left foot. He is manage this medically but does request a sympathetic block of the lumbar ganglions. She reports constant left foot pain. Occassionally radiates into ankle and calf. Also has numbness and tingling. Constantly keeps propped up. She reports color changes as well as swelling.  She is felt to have complex regional pain syndrome. Prior lumbar sympathetic block performed 10/26/2016 did have relief with last injection. States it took away the huge voltage shocking pain  But that has since returned.    ROS Otherwise per HPI.  Assessment & Plan: Visit Diagnoses:  1. Complex regional pain syndrome i of left lower limb     Plan: Findings:  Repeat lumbar sympathetic block for complex regional pain syndrome on the left lower extremity. We will set her up for a follow-up injection in the next few weeks depending on her outcome. We again reiterated that the injections themselves are not curative but it is to help her follow through with therapy both physical and psychological. As noted in the procedure note the patient did get very satisfactory block with increased redness and temperature change on the left.    Meds & Orders:  Meds ordered this encounter  Medications  . lidocaine (PF) (XYLOCAINE) 1 % injection 0.3 mL  . bupivacaine (MARCAINE) 0.5 % (with pres) injection 50 mL    Orders Placed This Encounter  Procedures  . Nerve Block  . XR C-ARM NO REPORT    Follow-up: Return for repeat injection 2 weeks.   Procedures: No procedures  performed  Lumbar Sympathetic Block with Fluoroscopic Guidance  Patient: Danielle Harrington      Date of Birth: 01/30/1976 MRN: [id number] PCP: Lynne Logan, MD      Visit Date: 11/17/2016   Universal Protocol:    Date/Time: 11/17/1808:03 AM  Consent Given By: the patient  Position: PRONE  Additional Comments: Vital signs were monitored before and after the procedure. Patient was prepped and draped in the usual sterile fashion. The correct patient, procedure, and site was verified.   Injection Procedure Details:  Procedure Site One Meds Administered:  Meds ordered this encounter  Medications  . lidocaine (PF) (XYLOCAINE) 1 % injection 0.3 mL  . bupivacaine (MARCAINE) 0.5 % (with pres) injection 50 mL     Laterality: Left  Location/Site:  L2-L3  Needle size: 22 G  Needle type: spinal needle  Needle Placement: As described below, left lateral along the lower border of the L2 vertebral body  Findings:  -Contrast Used: 2 mL iohexol 180 mg iodine/mL   -Comments: Flow of contrast did not show any vascular uptake and was a well positioned area for lumbar sympathetic block without any evidence of muscular injection. the patient did have an excellent block with increased temperature and erythema on the left lower extremity.  Procedure Details: The fluoroscope beam was manipulated to square off the endplates of the L2 vertebral body to achieve a true midline AP view.  An oblique view of the region overlying the inferior portion of the  L2 vertebral body with the ipsilateral tip of the transverse process aligned with the ventral edge of the vertebral body was obtained under fluoroscopic visualization.  For the target described the skin was anesthetized with 1 ml of 1% Lidocaine without epinephrine. The spinal needle was inserted down to the inferior anterolateral aspect of the L2 vertebral body using biplanar imaging.   A 61m volume of Omnipaque-240 was injected to make sure that  the needle tip was not in a blood vessel and a standard fascial plane outline was obtained. Bi-planar imaging confirmed placement and appropriate images documented.  The solution noted above was injected in this region.  Lower limb temperatures were measured pre- and post- procedure using the temperature probe on a Cadwell electrodiagnostic machine.   Additional Comments:  The patient tolerated the procedure well Dressing: Band-Aid    Post-procedure details: Patient was observed during the procedure. Post-procedure instructions were reviewed.  Patient left the clinic in stable condition.     Clinical History: No specialty comments available.  She reports that she has quit smoking. She smoked 0.50 packs per day. She has never used smokeless tobacco. No results for input(s): HGBA1C, LABURIC in the last 8760 hours.  Objective:  VS:  HT:    WT:   BMI:     BP:108/74  HR:89bpm  TEMP: ( )  RESP:  Physical Exam  Musculoskeletal:  Examination of left foot shows well-healed surgical scar with swelling there is some red discoloration around the dorsal part of this. She does have some increased sweating. There is some coolness to the limb compared to the right. She can't actively move the toes and foot.    Ortho Exam Imaging: No results found.  Past Medical/Family/Surgical/Social History: Medications & Allergies reviewed per EMR Patient Active Problem List   Diagnosis Date Noted  . Complex regional pain syndrome i of left lower limb 10/09/2016  . [place] neuroma, left    Past Medical History:  Diagnosis Date  . ADD (attention deficit disorder)   . Anxiety   . Arthritis    R knee, sciata treated - injection- 2012  . Complication of anesthesia    used scop. patch in the past  . Depression   . Family history of adverse reaction to anesthesia    N&V  . Headache    migraine, last one 3-4 months ago   . History of anemia   . History of bronchitis   . History of kidney stones   .  Hypertension   . Insomnia    Ambien  . PONV (postoperative nausea and vomiting)    No family history on file. Past Surgical History:  Procedure Laterality Date  . APPENDECTOMY    . CESAREAN SECTION    . EXCISION MORTON'S NEUROMA Left 09/29/2016   Procedure: EXCISION MORTON'S NEUROMA LEFT FOOT 3RD WEB SPACE;  Surgeon: MNewt Minion MD;  [place] [facility]  Service: Orthopedics;  Laterality: Left;  . KNEE SURGERY Right 1995 & 2009   ACL repair & arthroscopy -2009  . LAPAROSCOPIC ENDOMETRIOSIS FULGURATION    . OPEN REDUCTION INTERNAL FIXATION (ORIF) FOOT LISFRANC FRACTURE Left 02/18/2016   Procedure: OPEN REDUCTION INTERNAL FIXATION (ORIF) FOOT LISFRANC FRACTURE;  Surgeon: MNewt Minion MD;  [place] [facility]  Service: Orthopedics;  Laterality: Left;  . TONSILLECTOMY    . ULNAR NERVE REPAIR    . WRIST SURGERY Right    torn cartilage    Social History   Occupational History  . Not on  file.   Social History Main Topics  . Smoking status: Former Smoker    Packs/day: 0.50  . Smokeless tobacco: Never Used     Comment: Sts she recently quit (08/30/16)  . Alcohol use Yes     Comment: socially- 1-2 drinks per week   . Drug use: No  . Sexual activity: Not on file

## 2016-11-17 NOTE — Patient Instructions (Signed)

## 2016-11-20 ENCOUNTER — Telehealth (INDEPENDENT_AMBULATORY_CARE_PROVIDER_SITE_OTHER): Payer: Self-pay | Admitting: *Deleted

## 2016-11-20 NOTE — Telephone Encounter (Signed)
Patient called in this afternoon in regards to her FMLA paperwork. There seems to be a discrepancy on the dates that she was supposed to be out of work for? She needs to know if it was for 8 weeks after surgery or what? Just needing clarification on this please. Her CB # [phone]. Thank you

## 2016-11-23 ENCOUNTER — Telehealth (INDEPENDENT_AMBULATORY_CARE_PROVIDER_SITE_OTHER): Payer: Self-pay | Admitting: Orthopedic Surgery

## 2016-11-23 NOTE — Telephone Encounter (Signed)
Can you make an addendum to the pt's FMLA paperwork from  December to reflect that she can work from home as of 10/25/16? Please call and advise pt when this has been done. Thanks!

## 2016-11-23 NOTE — Telephone Encounter (Signed)
Patient called in regards to her FMLA paperwork and her release note.  She said that Dr. Jess Barters said that she could work from home.  Cb#[phone].  Thank you.

## 2016-11-24 ENCOUNTER — Other Ambulatory Visit (INDEPENDENT_AMBULATORY_CARE_PROVIDER_SITE_OTHER): Payer: Self-pay | Admitting: Family

## 2016-11-27 ENCOUNTER — Telehealth (INDEPENDENT_AMBULATORY_CARE_PROVIDER_SITE_OTHER): Payer: Self-pay | Admitting: *Deleted

## 2016-11-27 NOTE — Telephone Encounter (Signed)
Patient called in this morning in regards to needing a prescription refill on Oxycodone please. Her CB # [phone]. Thank you

## 2016-11-27 NOTE — Telephone Encounter (Signed)
Pt requesting refill on percocet. Just had # 60 filled 11 days ago. Please advise.

## 2016-11-28 ENCOUNTER — Other Ambulatory Visit (INDEPENDENT_AMBULATORY_CARE_PROVIDER_SITE_OTHER): Payer: Self-pay | Admitting: Family

## 2016-11-28 MED ORDER — OXYCODONE-ACETAMINOPHEN 5-325 MG PO TABS: 1.0000 | ORAL_TABLET | ORAL | 0 refills | Status: DC | PRN

## 2016-12-04 ENCOUNTER — Ambulatory Visit (INDEPENDENT_AMBULATORY_CARE_PROVIDER_SITE_OTHER): Payer: Managed Care, Other (non HMO) | Admitting: Orthopedic Surgery

## 2016-12-04 ENCOUNTER — Ambulatory Visit (INDEPENDENT_AMBULATORY_CARE_PROVIDER_SITE_OTHER): Payer: Self-pay | Admitting: Orthopedic Surgery

## 2016-12-04 ENCOUNTER — Ambulatory Visit (INDEPENDENT_AMBULATORY_CARE_PROVIDER_SITE_OTHER): Payer: Managed Care, Other (non HMO) | Admitting: Physical Medicine and Rehabilitation

## 2016-12-04 ENCOUNTER — Ambulatory Visit (INDEPENDENT_AMBULATORY_CARE_PROVIDER_SITE_OTHER): Payer: Managed Care, Other (non HMO)

## 2016-12-04 VITALS — BP 134/94

## 2016-12-04 DIAGNOSIS — G90522 Complex regional pain syndrome I of left lower limb: Secondary | ICD-10-CM

## 2016-12-04 MED ORDER — KETOROLAC TROMETHAMINE 10 MG PO TABS: 10.0000 mg | ORAL_TABLET | Freq: Four times a day (QID) | ORAL | 0 refills | Status: DC | PRN

## 2016-12-04 MED ORDER — LIDOCAINE HCL (PF) 1 % IJ SOLN
0.3300 mL | Freq: Once | INTRAMUSCULAR | Status: AC
  Administered 2016-12-04: 0.3 mL

## 2016-12-04 MED ORDER — BUPIVACAINE HCL 0.5 % IJ SOLN
50.0000 mL | Freq: Once | INTRAMUSCULAR | Status: AC
  Administered 2016-12-04: 50 mL

## 2016-12-04 MED ORDER — OXYCODONE-ACETAMINOPHEN 5-325 MG PO TABS: 1.0000 | ORAL_TABLET | ORAL | 0 refills | Status: DC | PRN

## 2016-12-04 NOTE — Progress Notes (Signed)
Patient was seen today she is status post a sympathetic block with Dr. Ernestina Patches. Her foot is warm and red with good interval outcome from the injection. She is given a prescription for Toradol as well as a refill prescription for her Percocet. Continue with scar massage. I will follow-up in the office at the time of her follow-up with Dr. Ernestina Patches for repeat evaluation for injection in 2 weeks.

## 2016-12-04 NOTE — Patient Instructions (Signed)

## 2016-12-04 NOTE — Progress Notes (Signed)
Danielle Harrington - 41 y.o. female MRN [id number]  Date of birth: June 03, 1976  Office Visit Note: Visit Date: 12/04/2016 PCP: Lynne Logan, MD Referred by: Donald Prose, MD  Subjective: Chief Complaint  Patient presents with  . Left Foot - Pain   HPI:  Danielle Harrington is a 41 year old female who originally had a fixation for a Lisfranc fracture by Dr. Sharol Given in the left foot. He has been following her and she now has complex regional pain syndrome in the left foot. He is manage this medically but does request a sympathetic block of the lumbar ganglion. She reports constant left foot pain. Occassionally radiates into ankle and calf. Also has numbness and tingling. Constantly keeps propped up. She reports color changes as well as swelling.  She is felt to have complex regional pain syndrome. Prior lumbar sympathetic block performed 10/26/2016 did have relief with last injection. States it took away the huge voltage shocking pain  second injection performed on 11/17/2016 did very well for for 5 days in the shocking pain was less. It has increased over the last few days. Still having color changes which can change minute to minute. She is now trying to walk on it more and continues with physical therapy.   ROS Otherwise per HPI.  Assessment & Plan: Visit Diagnoses:  1. Complex regional pain syndrome i of left lower limb     Plan: Findings:  Third lumbar sympathetic block on the left for complex regional pain syndrome of the left foot. Patient should continue with current medications and physical therapy. She may want to consider at some point stimulator trial.    Meds & Orders:  Meds ordered this encounter  Medications  . lidocaine (PF) (XYLOCAINE) 1 % injection 0.3 mL  . bupivacaine (MARCAINE) 0.5 % (with pres) injection 50 mL    Orders Placed This Encounter  Procedures  . Nerve Block  . XR C-ARM NO REPORT    Follow-up: Return for repeat 2 weeks.   Procedures: No procedures performed    Lumbar Sympathetic Block with Fluoroscopic Guidance  Patient: Danielle Harrington      Date of Birth: 08-12-1976 MRN: [id number] PCP: Lynne Logan, MD      Visit Date: 12/04/2016   Universal Protocol:    Date/Time: 02/12/181:33 PM  Consent Given By: the patient  Position: PRONE  Additional Comments: Vital signs were monitored before and after the procedure. Patient was prepped and draped in the usual sterile fashion. The correct patient, procedure, and site was verified.   Injection Procedure Details:  Procedure Site One Meds Administered:  Meds ordered this encounter  Medications  . lidocaine (PF) (XYLOCAINE) 1 % injection 0.3 mL  . bupivacaine (MARCAINE) 0.5 % (with pres) injection 50 mL     Laterality: Left  Location/Site:  L2-L3  Needle size: 22 G  Needle type: spinal needle  Needle Placement: The inferior third of the L2 vertebral body  Findings:  -Contrast Used: 2 mL iohexol 180 mg iodine/mL   -Comments: Excellent flow of contrast along the anterolateral vertebral body without sign of muscular uptake.  Procedure Details: The fluoroscope beam was manipulated to square off the endplates of the L2 vertebral body to achieve a true midline AP view.  An oblique view of the region overlying the inferior portion of the L2 vertebral body with the ipsilateral tip of the transverse process aligned with the ventral edge of the vertebral body was obtained under fluoroscopic visualization.  For the target described the  skin was anesthetized with 1 ml of 1% Lidocaine without epinephrine. The spinal needle was inserted down to the inferior anterolateral aspect of the L2 vertebral body using biplanar imaging.   A 3m volume of Omnipaque-240 was injected to make sure that the needle tip was not in a blood vessel and a standard fascial plane outline was obtained. Bi-planar imaging confirmed placement and appropriate images documented.  The solution noted above was injected in this  region.   Additional Comments:  The patient tolerated the procedure well No complications occurred Dressing: Band-Aid    Post-procedure details: Patient was observed during the procedure. Post-procedure instructions were reviewed.  Patient left the clinic in stable condition.   Clinical History: No specialty comments available.  She reports that she has quit smoking. She smoked 0.50 packs per day. She has never used smokeless tobacco. No results for input(s): HGBA1C, LABURIC in the last 8760 hours.  Objective:  VS:  HT:    WT:   BMI:     BP:(!) 134/94  HR: bpm  TEMP: ( )  RESP:  Physical Exam  Musculoskeletal:  Patient is now ambulating gingerly on the foot. She no longer has it on a scooter. She does still have some edema and more of a redness appearance on the left than right. She has less allodynia on the top and more allodynia on the bottom of the foot.    Ortho Exam Imaging: Xr C-arm No Report  Result Date: 12/04/2016 Please see Notes or Procedures tab for imaging impression.   Past Medical/Family/Surgical/Social History: Medications & Allergies reviewed per EMR Patient Active Problem List   Diagnosis Date Noted  . Complex regional pain syndrome i of left lower limb 10/09/2016  . Morton neuroma, left    Past Medical History:  Diagnosis Date  . ADD (attention deficit disorder)   . Anxiety   . Arthritis    R knee, sciata treated - injection- 2012  . Complication of anesthesia    used scop. patch in the past  . Depression   . Family history of adverse reaction to anesthesia    N&V  . Headache    migraine, last one 3-4 months ago   . History of anemia   . History of bronchitis   . History of kidney stones   . Hypertension   . Insomnia    Ambien  . PONV (postoperative nausea and vomiting)    No family history on file. Past Surgical History:  Procedure Laterality Date  . APPENDECTOMY    . CESAREAN SECTION    . EXCISION MORTON'S NEUROMA Left  09/29/2016   Procedure: EXCISION MORTON'S NEUROMA LEFT FOOT 3RD WEB SPACE;  Surgeon: MNewt Minion MD;  Location: [facility]  Service: Orthopedics;  Laterality: Left;  . KNEE SURGERY Right 1995 & 2009   ACL repair & arthroscopy -2009  . LAPAROSCOPIC ENDOMETRIOSIS FULGURATION    . OPEN REDUCTION INTERNAL FIXATION (ORIF) FOOT LISFRANC FRACTURE Left 02/18/2016   Procedure: OPEN REDUCTION INTERNAL FIXATION (ORIF) FOOT LISFRANC FRACTURE;  Surgeon: MNewt Minion MD;  [facility]  Service: Orthopedics;  Laterality: Left;  . TONSILLECTOMY    . ULNAR NERVE REPAIR    . WRIST SURGERY Right    torn cartilage    Social History   Occupational History  . Not on file.   Social History Main Topics  . Smoking status: Former Smoker    Packs/day: 0.50  . Smokeless tobacco: Never Used     Comment:  Sts she recently quit (08/30/16)  . Alcohol use Yes     Comment: socially- 1-2 drinks per week   . Drug use: No  . Sexual activity: Not on file

## 2016-12-04 NOTE — Progress Notes (Signed)
Office Visit Note   Patient: Danielle Harrington           Date of Birth: [birth year]           MRN: [id number] Visit Date: 12/04/2016              Requested by: Donald Prose, MD [address], [place] [address] PCP: Lynne Logan, MD  Chief Complaint  Patient presents with  . Left Foot - Pain    HPI: Left foot CRPS follow up. Pt had appt with Dr. Ernestina Patches today for a block. Pamella Pert, RMA    Assessment & Plan: Visit Diagnoses:  1. Complex regional pain syndrome i of left lower limb     Plan: Follow-up after appointment with Dr. Ernestina Patches. A prescription was provided for toradol.  Follow-Up Instructions: Return in about 2 weeks (around 12/18/2016).   Ortho Exam On examination patient has a good warmth and left foot status post sympathetic block. The foot is warm and red with increased perfusion.  Imaging: Xr C-arm No Report  Result Date: 12/04/2016 Please see Notes or Procedures tab for imaging impression.   Orders:  No orders of the defined types were placed in this encounter.  Meds ordered this encounter  Medications  . oxyCODONE-acetaminophen (PERCOCET/ROXICET) 5-325 MG tablet    Sig: Take 1 tablet by mouth every 4 (four) hours as needed for severe pain.    Dispense:  60 tablet    Refill:  0  . ketorolac (TORADOL) 10 MG tablet    Sig: Take 1 tablet (10 mg total) by mouth every 6 (six) hours as needed.    Dispense:  40 tablet    Refill:  0     Procedures: No procedures performed  Clinical Data: No additional findings.  Subjective: Review of Systems  Objective: Vital Signs: There were no vitals taken for this visit.  Specialty Comments:  No specialty comments available.  PMFS History: Patient Active Problem List   Diagnosis Date Noted  . Complex regional pain syndrome i of left lower limb 10/09/2016  . Morton neuroma, left    Past Medical History:  Diagnosis Date  . ADD (attention deficit disorder)   . Anxiety   .  Arthritis    R knee, sciata treated - injection- 2012  . Complication of anesthesia    used scop. patch in the past  . Depression   . Family history of adverse reaction to anesthesia    N&V  . Headache    migraine, last one 3-4 months ago   . History of anemia   . History of bronchitis   . History of kidney stones   . Hypertension   . Insomnia    Ambien  . PONV (postoperative nausea and vomiting)     No family history on file.  Past Surgical History:  Procedure Laterality Date  . APPENDECTOMY    . CESAREAN SECTION    . EXCISION MORTON'S NEUROMA Left 09/29/2016   Procedure: EXCISION MORTON'S NEUROMA LEFT FOOT 3RD WEB SPACE;  Surgeon: Newt Minion, MD;  Location: [facility];  Service: Orthopedics;  Laterality: Left;  . KNEE SURGERY Right 1995 & 2009   ACL repair & arthroscopy -2009  . LAPAROSCOPIC ENDOMETRIOSIS FULGURATION    . OPEN REDUCTION INTERNAL FIXATION (ORIF) FOOT LISFRANC FRACTURE Left 02/18/2016   Procedure: OPEN REDUCTION INTERNAL FIXATION (ORIF) FOOT LISFRANC FRACTURE;  Surgeon: Newt Minion, MD;  Location: [facility];  Service: Orthopedics;  Laterality: Left;  . TONSILLECTOMY    . ULNAR NERVE REPAIR    . WRIST SURGERY Right    torn cartilage    Social History   Occupational History  . Not on file.   Social History Main Topics  . Smoking status: Former Smoker    Packs/day: 0.50  . Smokeless tobacco: Never Used     Comment: Sts she recently quit (08/30/16)  . Alcohol use Yes     Comment: socially- 1-2 drinks per week   . Drug use: No  . Sexual activity: Not on file

## 2016-12-04 NOTE — Procedures (Signed)
Lumbar Sympathetic Block with Fluoroscopic Guidance  Patient: Danielle Harrington      Date of Birth: [birth year] MRN: [id number] PCP: Lynne Logan, MD      Visit Date: 12/04/2016   Universal Protocol:    Date/Time: 02/12/181:33 PM  Consent Given By: the patient  Position: PRONE  Additional Comments: Vital signs were monitored before and after the procedure. Patient was prepped and draped in the usual sterile fashion. The correct patient, procedure, and site was verified.   Injection Procedure Details:  Procedure Site One Meds Administered:  Meds ordered this encounter  Medications  . lidocaine (PF) (XYLOCAINE) 1 % injection 0.3 mL  . bupivacaine (MARCAINE) 0.5 % (with pres) injection 50 mL     Laterality: Left  Location/Site:  L2-L3  Needle size: 22 G  Needle type: spinal needle  Needle Placement: The inferior third of the L2 vertebral body  Findings:  -Contrast Used: 2 mL iohexol 180 mg iodine/mL   -Comments: Excellent flow of contrast along the anterolateral vertebral body without sign of muscular uptake.  Procedure Details: The fluoroscope beam was manipulated to square off the endplates of the L2 vertebral body to achieve a true midline AP view.  An oblique view of the region overlying the inferior portion of the L2 vertebral body with the ipsilateral tip of the transverse process aligned with the ventral edge of the vertebral body was obtained under fluoroscopic visualization.  For the target described the skin was anesthetized with 1 ml of 1% Lidocaine without epinephrine. The spinal needle was inserted down to the inferior anterolateral aspect of the L2 vertebral body using biplanar imaging.   A 66m volume of Omnipaque-240 was injected to make sure that the needle tip was not in a blood vessel and a standard fascial plane outline was obtained. Bi-planar imaging confirmed placement and appropriate images documented.  The solution noted above was injected in this  region.   Additional Comments:  The patient tolerated the procedure well No complications occurred Dressing: Band-Aid    Post-procedure details: Patient was observed during the procedure. Post-procedure instructions were reviewed.  Patient left the clinic in stable condition.

## 2016-12-12 ENCOUNTER — Other Ambulatory Visit (INDEPENDENT_AMBULATORY_CARE_PROVIDER_SITE_OTHER): Payer: Self-pay | Admitting: Orthopedic Surgery

## 2016-12-12 MED ORDER — OXYCODONE-ACETAMINOPHEN 5-325 MG PO TABS: 1.0000 | ORAL_TABLET | ORAL | 0 refills | Status: DC | PRN

## 2016-12-18 ENCOUNTER — Encounter (INDEPENDENT_AMBULATORY_CARE_PROVIDER_SITE_OTHER): Payer: Managed Care, Other (non HMO) | Admitting: Physical Medicine and Rehabilitation

## 2016-12-18 ENCOUNTER — Ambulatory Visit (INDEPENDENT_AMBULATORY_CARE_PROVIDER_SITE_OTHER): Payer: Self-pay | Admitting: Orthopedic Surgery

## 2016-12-18 ENCOUNTER — Encounter (INDEPENDENT_AMBULATORY_CARE_PROVIDER_SITE_OTHER): Payer: Self-pay | Admitting: Orthopedic Surgery

## 2016-12-18 DIAGNOSIS — G90522 Complex regional pain syndrome I of left lower limb: Secondary | ICD-10-CM

## 2016-12-18 MED ORDER — KETOROLAC TROMETHAMINE 10 MG PO TABS: 10.0000 mg | ORAL_TABLET | Freq: Four times a day (QID) | ORAL | 0 refills | Status: DC | PRN

## 2016-12-18 MED ORDER — OXYCODONE-ACETAMINOPHEN 5-325 MG PO TABS: 1.0000 | ORAL_TABLET | ORAL | 0 refills | Status: DC | PRN

## 2016-12-18 NOTE — Progress Notes (Signed)
Office Visit Note   Patient: Danielle Harrington           Date of Birth: 1975/10/24           MRN: [id number] Visit Date: 12/18/2016              Requested by: Donald Prose, MD [address] [place], [place] [address] PCP: Lynne Logan, MD  Chief Complaint  Patient presents with  . Left Foot - Follow-up    EXCISION MORTON'S NEUROMA LEFT FOOT 3RD WEB SPACE 09/29/16    HPI: Patient is a 41 y.o female who presents today for follow up complex regional pain syndrome. Patient is status post injection with Dr. Ernestina Patches, which provided her relief, but decreased relief from initial injection. She was suppose to get another shot today with Dr. Ernestina Patches, but due to equipment error she is unable. She is currently taking oxycodone for her pain. Danielle Harrington, RT  Patient will continue with her physical therapy both water aerobics and physical therapy at the therapist office.  Assessment & Plan: Visit Diagnoses:  1. Complex regional pain syndrome i of left lower limb     Plan: Continue with the Toradol once a month for 10 days continue with the Percocet as needed for pain continue with the Neurontin. Patient is given a note that she may work from home for the next 6 months from 10/25/2016. Patient is scheduled for a sympathetic block next week.  Follow-Up Instructions: Return in about 2 weeks (around 01/01/2017).   Ortho Exam On examination patient does have increased redness in the left foot compared to the right there is hypersensitivity to light touch there is no change in the skin color or temperature or moisture content.  Imaging: No results found.  Orders:  No orders of the defined types were placed in this encounter.  Meds ordered this encounter  Medications  . oxyCODONE-acetaminophen (PERCOCET/ROXICET) 5-325 MG tablet    Sig: Take 1 tablet by mouth every 4 (four) hours as needed for severe pain.    Dispense:  60 tablet    Refill:  0  . ketorolac (TORADOL) 10 MG tablet      Sig: Take 1 tablet (10 mg total) by mouth every 6 (six) hours as needed.    Dispense:  40 tablet    Refill:  0     Procedures: No procedures performed  Clinical Data: No additional findings.  Subjective: Review of Systems  Objective: Vital Signs: There were no vitals taken for this visit.  Specialty Comments:  No specialty comments available.  PMFS History: Patient Active Problem List   Diagnosis Date Noted  . Complex regional pain syndrome i of left lower limb 10/09/2016  . Morton neuroma, left    Past Medical History:  Diagnosis Date  . ADD (attention deficit disorder)   . Anxiety   . Arthritis    R knee, sciata treated - injection- 2012  . Complication of anesthesia    used scop. patch in the past  . Depression   . Family history of adverse reaction to anesthesia    N&V  . Headache    migraine, last one 3-4 months ago   . History of anemia   . History of bronchitis   . History of kidney stones   . Hypertension   . Insomnia    Ambien  . PONV (postoperative nausea and vomiting)     History reviewed. No pertinent family history.  Past Surgical  History:  Procedure Laterality Date  . APPENDECTOMY    . CESAREAN SECTION    . EXCISION MORTON'S NEUROMA Left 09/29/2016   Procedure: EXCISION MORTON'S NEUROMA LEFT FOOT 3RD WEB SPACE;  Surgeon: Newt Minion, MD;  Location: [facility];  Service: Orthopedics;  Laterality: Left;  . KNEE SURGERY Right 1995 & 2009   ACL repair & arthroscopy -2009  . LAPAROSCOPIC ENDOMETRIOSIS FULGURATION    . OPEN REDUCTION INTERNAL FIXATION (ORIF) FOOT LISFRANC FRACTURE Left 02/18/2016   Procedure: OPEN REDUCTION INTERNAL FIXATION (ORIF) FOOT LISFRANC FRACTURE;  Surgeon: Newt Minion, MD;  Location: [facility];  Service: Orthopedics;  Laterality: Left;  . TONSILLECTOMY    . ULNAR NERVE REPAIR    . WRIST SURGERY Right    torn cartilage    Social History   Occupational History  . Not on file.   Social History Main Topics  . Smoking  status: Former Smoker    Packs/day: 0.50  . Smokeless tobacco: Never Used     Comment: Sts she recently quit (08/30/16)  . Alcohol use Yes     Comment: socially- 1-2 drinks per week   . Drug use: No  . Sexual activity: Not on file

## 2016-12-26 ENCOUNTER — Other Ambulatory Visit (INDEPENDENT_AMBULATORY_CARE_PROVIDER_SITE_OTHER): Payer: Self-pay | Admitting: Orthopedic Surgery

## 2016-12-27 ENCOUNTER — Encounter (INDEPENDENT_AMBULATORY_CARE_PROVIDER_SITE_OTHER): Payer: Self-pay | Admitting: Physical Medicine and Rehabilitation

## 2016-12-27 ENCOUNTER — Ambulatory Visit (INDEPENDENT_AMBULATORY_CARE_PROVIDER_SITE_OTHER): Payer: Managed Care, Other (non HMO) | Admitting: Physical Medicine and Rehabilitation

## 2016-12-27 ENCOUNTER — Other Ambulatory Visit (INDEPENDENT_AMBULATORY_CARE_PROVIDER_SITE_OTHER): Payer: Self-pay | Admitting: Orthopedic Surgery

## 2016-12-27 ENCOUNTER — Ambulatory Visit (INDEPENDENT_AMBULATORY_CARE_PROVIDER_SITE_OTHER): Payer: Managed Care, Other (non HMO)

## 2016-12-27 ENCOUNTER — Encounter (INDEPENDENT_AMBULATORY_CARE_PROVIDER_SITE_OTHER): Payer: Managed Care, Other (non HMO) | Admitting: Physical Medicine and Rehabilitation

## 2016-12-27 DIAGNOSIS — G90522 Complex regional pain syndrome I of left lower limb: Secondary | ICD-10-CM | POA: Diagnosis not present

## 2016-12-27 MED ORDER — OXYCODONE-ACETAMINOPHEN 5-325 MG PO TABS: 1.0000 | ORAL_TABLET | ORAL | 0 refills | Status: DC | PRN

## 2016-12-27 MED ORDER — BUPIVACAINE HCL 0.5 % IJ SOLN
50.0000 mL | Freq: Once | INTRAMUSCULAR | Status: AC
  Administered 2016-12-27: 50 mL

## 2016-12-27 MED ORDER — LIDOCAINE HCL (PF) 1 % IJ SOLN
0.3300 mL | Freq: Once | INTRAMUSCULAR | Status: AC
  Administered 2016-12-27: 0.3 mL

## 2016-12-27 MED ORDER — DEXAMETHASONE SODIUM PHOSPHATE 10 MG/ML IJ SOLN
15.0000 mg | Freq: Once | INTRAMUSCULAR | Status: AC
  Administered 2016-12-27: 15 mg

## 2016-12-27 NOTE — Patient Instructions (Signed)

## 2016-12-27 NOTE — Progress Notes (Signed)
Danielle Harrington - 41 y.o. female MRN [id number]  Date of birth: 09-24-1976  Office Visit Note: Visit Date: 12/27/2016 PCP: Lynne Logan, MD Referred by: Donald Prose, MD  Subjective: Chief Complaint  Patient presents with  . Left Foot - Pain   HPI: [name] is a 41 year old female with left complex regional pain syndrome after fixation of left Lisfranc injury. Patient states she did not have as much relief with the last block as she did the one prior. Took 2 or 3 days to get any relief and then maybe had 3 or 4 days of relief. Constantly keeps propped up. She is still having discoloration as well as allodynia. She isn't a surgical shoe at this point.    ROS Otherwise per HPI.  Assessment & Plan: Visit Diagnoses:  1. Complex regional pain syndrome i of left lower limb     Plan: Findings:  Repeat left lumbar sympathetic block. I did add a small amount of Decadron, 10 mg. This may sustain the block a little bit longer. She will follow-up with Dr. Sharol Given to monitor the effects and she see him just a few days. Depending on the relief would look at repeat once again versus potential spinal cord stimulator trial or other medication tight management.    Meds & Orders:  Meds ordered this encounter  Medications  . lidocaine (PF) (XYLOCAINE) 1 % injection 0.3 mL  . bupivacaine (MARCAINE) 0.5 % (with pres) injection 50 mL  . dexamethasone (DECADRON) injection 15 mg    Orders Placed This Encounter  Procedures  . Nerve Block  . XR C-ARM NO REPORT    Follow-up: Return for Scheduled follow up with Dr. Sharol Given.   Procedures: No procedures performed  Lumbar Sympathetic Block with Fluoroscopic Guidance  Patient: Danielle Harrington      Date of Birth: [birth year] MRN: [id number] PCP: Lynne Logan, MD      Visit Date: 12/27/2016   Universal Protocol:    Date/Time: 03/07/182:51 PM  Consent Given By: the patient  Position: PRONE  Additional Comments: Vital signs were monitored before and  after the procedure. Patient was prepped and draped in the usual sterile fashion. The correct patient, procedure, and site was verified.   Injection Procedure Details:  Procedure Site One Meds Administered:  Meds ordered this encounter  Medications  . lidocaine (PF) (XYLOCAINE) 1 % injection 0.3 mL  . bupivacaine (MARCAINE) 0.5 % (with pres) injection 50 mL  . dexamethasone (DECADRON) injection 15 mg     Laterality: Left  Location/Site:  L2-L3  Needle size: 22 G  Needle type: spinal needle  Needle Placement: Lateral vertebral body  Findings:  -Contrast Used: 2 mL iohexol 180 mg iodine/mL   -Comments: There was excellent flow of contrast without pattern which would be considered muscular and there was no intravascular uptake. the flow did extend from the L2 vertebral body midline down to pass the L3 vertebral body  Procedure Details: The fluoroscope beam was manipulated to square off the endplates of the L2 vertebral body to achieve a true midline AP view.  An oblique view of the region overlying the inferior portion of the L2 vertebral body with the ipsilateral tip of the transverse process aligned with the ventral edge of the vertebral body was obtained under fluoroscopic visualization.  For the target described the skin was anesthetized with 1 ml of 1% Lidocaine without epinephrine. The spinal needle was inserted down to the inferior anterolateral aspect of the L2 vertebral body  using biplanar imaging.   A 78m volume of Omnipaque-240 was injected to make sure that the needle tip was not in a blood vessel and a standard fascial plane outline was obtained. Bi-planar imaging confirmed placement and appropriate images documented.  The solution noted above was injected in this region. The patient had a significant increase in temperature and erythema at the left foot post injection  Additional Comments:  The patient tolerated the procedure well No complications occurred Dressing:  Band-Aid    Post-procedure details: Patient was observed during the procedure. Post-procedure instructions were reviewed. Patient left the clinic in stable condition.   Clinical History: No specialty comments available.  She reports that she has quit smoking. She smoked 0.50 packs per day. She has never used smokeless tobacco. No results for input(s): HGBA1C, LABURIC in the last 8760 hours.  Objective:  VS:  HT:    WT:   BMI:     BP:   HR: bpm  TEMP: ( )  RESP:  Physical Exam  Constitutional: She is oriented to person, place, and time.  Neurological: She is alert and oriented to person, place, and time. She exhibits normal muscle tone.  Left foot shows mild trophic changes with increased redness and erythema. The foot is colder than the other foot. There is some mild swelling. There is allodynia. She has decent strength.    Ortho Exam Imaging: Xr C-arm No Report  Result Date: 12/27/2016 Please see Notes or Procedures tab for imaging impression.   Past Medical/Family/Surgical/Social History: Medications & Allergies reviewed per EMR Patient Active Problem List   Diagnosis Date Noted  . Complex regional pain syndrome i of left lower limb 10/09/2016  . Morton neuroma, left    Past Medical History:  Diagnosis Date  . ADD (attention deficit disorder)   . Anxiety   . Arthritis    R knee, sciata treated - injection- 2012  . Complication of anesthesia    used scop. patch in the past  . Depression   . Family history of adverse reaction to anesthesia    N&V  . Headache    migraine, last one 3-4 months ago   . History of anemia   . History of bronchitis   . History of kidney stones   . Hypertension   . Insomnia    Ambien  . PONV (postoperative nausea and vomiting)    History reviewed. No pertinent family history. Past Surgical History:  Procedure Laterality Date  . APPENDECTOMY    . CESAREAN SECTION    . EXCISION MORTON'S NEUROMA Left 09/29/2016   Procedure:  EXCISION MORTON'S NEUROMA LEFT FOOT 3RD WEB SPACE;  Surgeon: MNewt Minion MD;  [facility]  Service: Orthopedics;  Laterality: Left;  . KNEE SURGERY Right 1995 & 2009   ACL repair & arthroscopy -2009  . LAPAROSCOPIC ENDOMETRIOSIS FULGURATION    . OPEN REDUCTION INTERNAL FIXATION (ORIF) FOOT LISFRANC FRACTURE Left 02/18/2016   Procedure: OPEN REDUCTION INTERNAL FIXATION (ORIF) FOOT LISFRANC FRACTURE;  Surgeon: MNewt Minion MD;  [facility]  Service: Orthopedics;  Laterality: Left;  . TONSILLECTOMY    . ULNAR NERVE REPAIR    . WRIST SURGERY Right    torn cartilage    Social History   Occupational History  . Not on file.   Social History Main Topics  . Smoking status: Former Smoker    Packs/day: 0.50  . Smokeless tobacco: Never Used     Comment: Sts she recently quit (08/30/16)  .  Alcohol use Yes     Comment: socially- 1-2 drinks per week   . Drug use: No  . Sexual activity: Not on file

## 2016-12-27 NOTE — Procedures (Signed)
Lumbar Sympathetic Block with Fluoroscopic Guidance  Patient: Danielle Harrington      Date of Birth: 04/10/1976 MRN: [id number] PCP: Lynne Logan, MD      Visit Date: 12/27/2016   Universal Protocol:    Date/Time: 03/07/182:51 PM  Consent Given By: the patient  Position: PRONE  Additional Comments: Vital signs were monitored before and after the procedure. Patient was prepped and draped in the usual sterile fashion. The correct patient, procedure, and site was verified.   Injection Procedure Details:  Procedure Site One Meds Administered:  Meds ordered this encounter  Medications  . lidocaine (PF) (XYLOCAINE) 1 % injection 0.3 mL  . bupivacaine (MARCAINE) 0.5 % (with pres) injection 50 mL  . dexamethasone (DECADRON) injection 15 mg     Laterality: Left  Location/Site:  L2-L3  Needle size: 22 G  Needle type: spinal needle  Needle Placement: Lateral vertebral body  Findings:  -Contrast Used: 2 mL iohexol 180 mg iodine/mL   -Comments: There was excellent flow of contrast without pattern which would be considered muscular and there was no intravascular uptake. the flow did extend from the L2 vertebral body midline down to pass the L3 vertebral body  Procedure Details: The fluoroscope beam was manipulated to square off the endplates of the L2 vertebral body to achieve a true midline AP view.  An oblique view of the region overlying the inferior portion of the L2 vertebral body with the ipsilateral tip of the transverse process aligned with the ventral edge of the vertebral body was obtained under fluoroscopic visualization.  For the target described the skin was anesthetized with 1 ml of 1% Lidocaine without epinephrine. The spinal needle was inserted down to the inferior anterolateral aspect of the L2 vertebral body using biplanar imaging.   A 70m volume of Omnipaque-240 was injected to make sure that the needle tip was not in a blood vessel and a standard fascial plane  outline was obtained. Bi-planar imaging confirmed placement and appropriate images documented.  The solution noted above was injected in this region. The patient had a significant increase in temperature and erythema at the left foot post injection  Additional Comments:  The patient tolerated the procedure well No complications occurred Dressing: Band-Aid    Post-procedure details: Patient was observed during the procedure. Post-procedure instructions were reviewed. Patient left the clinic in stable condition.

## 2017-01-01 ENCOUNTER — Ambulatory Visit (INDEPENDENT_AMBULATORY_CARE_PROVIDER_SITE_OTHER): Payer: Self-pay | Admitting: Orthopedic Surgery

## 2017-01-01 ENCOUNTER — Encounter (INDEPENDENT_AMBULATORY_CARE_PROVIDER_SITE_OTHER): Payer: Self-pay | Admitting: Orthopedic Surgery

## 2017-01-01 DIAGNOSIS — G90522 Complex regional pain syndrome I of left lower limb: Secondary | ICD-10-CM

## 2017-01-01 MED ORDER — OXYCODONE-ACETAMINOPHEN 5-325 MG PO TABS: 1.0000 | ORAL_TABLET | ORAL | 0 refills | Status: DC | PRN

## 2017-01-01 NOTE — Progress Notes (Signed)
Office Visit Note   Patient: Danielle Harrington           Date of Birth: Jul 16, 1976           MRN: [id number] Visit Date: 01/01/2017              Requested by: Donald Prose, MD [place], [place] [address] PCP: Lynne Logan, MD   Assessment & Plan: Visit Diagnoses:  1. Complex regional pain syndrome type 1 of left lower extremity     Plan: Patient has had a good relief from her last sympathetic block which also included a steroid. She will refill a prescription for her Toradol a new prescription for pain medicine provided follow-up in 2 weeks. Patient states she is going to consider possible admission for a ketamine block.  Follow-Up Instructions: Return in about 2 weeks (around 01/15/2017).   Orders:  No orders of the defined types were placed in this encounter.  No orders of the defined types were placed in this encounter.     Procedures: No procedures performed   Clinical Data: No additional findings.   Subjective: Chief Complaint  Patient presents with  . Left Foot - Follow-up, Pain    Patient is a 41 y.o female who presents today for follow up complex regional pain syndrome. She got an injection 12/27/16 with Dr Ernestina Patches. She is doing much better. She states she is having some tingling, numbness and burning. Pain level today is (6/10) doing PT three times a week. Overall doing much better than last visit.     Review of Systems   Objective: Vital Signs: There were no vitals taken for this visit.  Physical Exam on examination patient is alert oriented no adenopathy well-dressed normal affect. Examination of her left foot she does have increased redness in the left foot the temperature is equal in both feet the skin texture is equal in both feet she does have hypersensitivity to light touch she has good pulses the incisions are healing quite nicely.  Ortho Exam   Complete review of systems otherwise negative.  Specialty Comments:  No  specialty comments available.  Imaging: No results found.   PMFS History: Patient Active Problem List   Diagnosis Date Noted  . Complex regional pain syndrome type 1 of left lower extremity 10/09/2016  . Morton neuroma, left    Past Medical History:  Diagnosis Date  . ADD (attention deficit disorder)   . Anxiety   . Arthritis    R knee, sciata treated - injection- 2012  . Complication of anesthesia    used scop. patch in the past  . Depression   . Family history of adverse reaction to anesthesia    N&V  . Headache    migraine, last one 3-4 months ago   . History of anemia   . History of bronchitis   . History of kidney stones   . Hypertension   . Insomnia    Ambien  . PONV (postoperative nausea and vomiting)     No family history on file.  Past Surgical History:  Procedure Laterality Date  . APPENDECTOMY    . CESAREAN SECTION    . EXCISION MORTON'S NEUROMA Left 09/29/2016   Procedure: EXCISION MORTON'S NEUROMA LEFT FOOT 3RD WEB SPACE;  Surgeon: Newt Minion, MD;  Location: [facility];  Service: Orthopedics;  Laterality: Left;  . KNEE SURGERY Right 1995 & 2009   ACL repair & arthroscopy -2009  . LAPAROSCOPIC  ENDOMETRIOSIS FULGURATION    . OPEN REDUCTION INTERNAL FIXATION (ORIF) FOOT LISFRANC FRACTURE Left 02/18/2016   Procedure: OPEN REDUCTION INTERNAL FIXATION (ORIF) FOOT LISFRANC FRACTURE;  Surgeon: Newt Minion, MD;  Location: [facility];  Service: Orthopedics;  Laterality: Left;  . TONSILLECTOMY    . ULNAR NERVE REPAIR    . WRIST SURGERY Right    torn cartilage    Social History   Occupational History  . Not on file.   Social History Main Topics  . Smoking status: Former Smoker    Packs/day: 0.50  . Smokeless tobacco: Never Used     Comment: Sts she recently quit (08/30/16)  . Alcohol use Yes     Comment: socially- 1-2 drinks per week   . Drug use: No  . Sexual activity: Not on file

## 2017-01-02 ENCOUNTER — Telehealth (INDEPENDENT_AMBULATORY_CARE_PROVIDER_SITE_OTHER): Payer: Self-pay | Admitting: Physical Medicine and Rehabilitation

## 2017-01-03 NOTE — Telephone Encounter (Signed)
Please advise 

## 2017-01-03 NOTE — Telephone Encounter (Signed)
See message below- requesting referral.

## 2017-01-03 NOTE — Telephone Encounter (Signed)
Ok to repeat, ask Dr. Sharol Given if they want to refer to [facility], Dr. Mechele Dawley [facility] for eval/treat CRPS ? Ketamine or other, has responded temporarily to Lumbar sympathetic blocks

## 2017-01-04 NOTE — Telephone Encounter (Signed)
Submitted auth request and it is pending review.

## 2017-01-05 ENCOUNTER — Telehealth (INDEPENDENT_AMBULATORY_CARE_PROVIDER_SITE_OTHER): Payer: Self-pay | Admitting: *Deleted

## 2017-01-05 NOTE — Telephone Encounter (Signed)
Received auth. Josem Kaufmann #[id number]. Eff 01/12/17-04/12/17. Pt is already scheduled for 01/12/17.

## 2017-01-05 NOTE — Telephone Encounter (Signed)
I called and advised that this referral request was sent for approval by Dr. Sharol Given and as soon as he returns that to me I will make the referral. Pt voiced understanding and will call with questions.

## 2017-01-05 NOTE — Telephone Encounter (Signed)
Patient called in this afternoon in regards to a referral to Dr. Sable Feil at [facility] to have Cadimine treatment? She wanted to if this referral had took place yet? I did not see an outgoing referral in her chart? Her CB # [phone]. Thank you

## 2017-01-08 NOTE — Telephone Encounter (Signed)
Call patient and try to set up an appointment with [facility] for evaluation for  admission for ketamine treatment for RSD

## 2017-01-09 ENCOUNTER — Other Ambulatory Visit (INDEPENDENT_AMBULATORY_CARE_PROVIDER_SITE_OTHER): Payer: Self-pay

## 2017-01-09 DIAGNOSIS — G9050 Complex regional pain syndrome I, unspecified: Secondary | ICD-10-CM

## 2017-01-09 NOTE — Telephone Encounter (Signed)
Order entered into chart

## 2017-01-11 ENCOUNTER — Other Ambulatory Visit (INDEPENDENT_AMBULATORY_CARE_PROVIDER_SITE_OTHER): Payer: Self-pay | Admitting: Orthopedic Surgery

## 2017-01-11 ENCOUNTER — Other Ambulatory Visit (INDEPENDENT_AMBULATORY_CARE_PROVIDER_SITE_OTHER): Payer: Self-pay

## 2017-01-11 MED ORDER — KETOROLAC TROMETHAMINE 10 MG PO TABS: 10.0000 mg | ORAL_TABLET | Freq: Four times a day (QID) | ORAL | 0 refills | Status: DC | PRN

## 2017-01-12 ENCOUNTER — Ambulatory Visit (INDEPENDENT_AMBULATORY_CARE_PROVIDER_SITE_OTHER): Payer: Managed Care, Other (non HMO)

## 2017-01-12 ENCOUNTER — Encounter (INDEPENDENT_AMBULATORY_CARE_PROVIDER_SITE_OTHER): Payer: Self-pay | Admitting: Physical Medicine and Rehabilitation

## 2017-01-12 ENCOUNTER — Telehealth (INDEPENDENT_AMBULATORY_CARE_PROVIDER_SITE_OTHER): Payer: Self-pay | Admitting: Physical Medicine and Rehabilitation

## 2017-01-12 ENCOUNTER — Ambulatory Visit (INDEPENDENT_AMBULATORY_CARE_PROVIDER_SITE_OTHER): Payer: Managed Care, Other (non HMO) | Admitting: Physical Medicine and Rehabilitation

## 2017-01-12 VITALS — BP 109/76 | HR 100 | Temp 98.4°F

## 2017-01-12 DIAGNOSIS — G90522 Complex regional pain syndrome I of left lower limb: Secondary | ICD-10-CM | POA: Diagnosis not present

## 2017-01-12 MED ORDER — BUPIVACAINE HCL 0.5 % IJ SOLN
50.0000 mL | Freq: Once | INTRAMUSCULAR | Status: AC
  Administered 2017-01-12: 50 mL

## 2017-01-12 MED ORDER — LIDOCAINE HCL (PF) 1 % IJ SOLN
0.3300 mL | Freq: Once | INTRAMUSCULAR | Status: AC
  Administered 2017-01-12: 0.3 mL

## 2017-01-12 MED ORDER — DIAZEPAM 5 MG PO TABS: ORAL_TABLET | ORAL | 0 refills | Status: DC

## 2017-01-12 NOTE — Progress Notes (Signed)
Danielle Harrington - 41 y.o. female MRN [id number]  Date of birth: [birth year]  Office Visit Note: Visit Date: 01/12/2017 PCP: Danielle Logan, MD Referred by: Danielle Prose, MD  Subjective: Chief Complaint  Patient presents with  . Left Foot - Pain   HPI: Danielle Harrington is a 41 year old female with left complex regional pain syndrome after fixation of left Lisfranc injury. Last sympathetic block which was performed on 12/27/2016 gave her the best relief so far but only lasted 4 days. Constantly keeps propped up. She is still having discoloration as well as allodynia. Brief examination again today shows bluish discoloration with cooler foot compared to the right. Danielle Harrington does have a consultation/referral in for Danielle Harrington.    ROS Otherwise per HPI.  Assessment & Plan: Visit Diagnoses:  1. Complex regional pain syndrome type 1 of left lower extremity     Plan: Findings:  Successful left lumbar sympathetic block. She did get increased temperature and foot showed more normal coloration. The last block however gave her almost to the red appearance after the block. She was able to bear weight on her foot as she was leaving the office which was better than when she came in. We are going to schedule her for another block a week out while she is waiting referral to [facility]. I also gave her a preprocedure Valium prescription.    Meds & Orders:  Meds ordered this encounter  Medications  . lidocaine (PF) (XYLOCAINE) 1 % injection 0.3 mL  . bupivacaine (MARCAINE) 0.5 % (with pres) injection 50 mL  . diazepam (VALIUM) 5 MG tablet    Sig: Take 1 by mouth 1 to 2 hours pre-procedure. May repeat if necessary.    Dispense:  2 tablet    Refill:  0    Orders Placed This Encounter  Procedures  . Nerve Block  . XR C-ARM NO REPORT    Follow-up: Return in about 1 week (around 01/19/2017) for repeat lumbar sympathetic block.   Procedures: No procedures performed  Lumbar  Sympathetic Block with Fluoroscopic Guidance  Patient: Danielle Harrington      Date of Birth: 1976-05-22 MRN: [id number] PCP: Danielle Logan, MD      Visit Date: 01/12/2017   Universal Protocol:    Date/Time: 03/23/181:09 PM  Consent Harrington By: the patient  Position: PRONE  Additional Comments: Vital signs were monitored before and after the procedure. Patient was prepped and draped in the usual sterile fashion. The correct patient, procedure, and site was verified.   Injection Procedure Details:  Procedure Site One Meds Administered:  Meds ordered this encounter  Medications  . lidocaine (PF) (XYLOCAINE) 1 % injection 0.3 mL  . bupivacaine (MARCAINE) 0.5 % (with pres) injection 50 mL  . diazepam (VALIUM) 5 MG tablet    Sig: Take 1 by mouth 1 to 2 hours pre-procedure. May repeat if necessary.    Dispense:  2 tablet    Refill:  0     Laterality: Left  Location/Site:  L2-L3  Needle size: 22 G  Needle type: spinal  Needle Placement: Lateral vertebral body  Findings:  -Contrast Used: 2 mL iohexol 180 mg iodine/mL   -Comments: Excellent flow of contrast showing no vascular uptake with spread of medication 1 level above and below the site  Procedure Details: The fluoroscope beam was manipulated to square off the endplates of the L2 vertebral body to achieve a true midline AP view.  An oblique view of the  region overlying the inferior portion of the L2 vertebral body with the ipsilateral tip of the transverse process aligned with the ventral edge of the vertebral body was obtained under fluoroscopic visualization. The target is the outer edge of the lower third of the L2 vertebral body.  For the target described the skin was anesthetized with 1 ml of 1% Lidocaine without epinephrine. The spinal needle was inserted down to the inferior anterolateral aspect of the L2 vertebral body using biplanar imaging.   A 54m volume of Omnipaque-240 was injected to make sure that the needle  tip was not in a blood vessel or intra muscular and a standard fascial plane outline was obtained. Bi-planar imaging confirmed placement and appropriate images documented.  The solution noted above was injected in this region slowly in 1 ml aliquots.    Additional Comments:  The patient tolerated the procedure well No complications occurred Dressing: Band-Aid    Post-procedure details: Patient was observed during the procedure. Post-procedure instructions were reviewed.  Patient left the clinic in stable condition.   Clinical History: No specialty comments available.  She reports that she has quit smoking. She smoked 0.50 packs per day. She has never used smokeless tobacco. No results for input(s): HGBA1C, LABURIC in the last 8760 hours.  Objective:  VS:  HT:    WT:   BMI:     BP:109/76  HR:100bpm  TEMP:98.4 F (36.9 C)(Oral)  RESP:100 % Physical Exam  Musculoskeletal:  Left lower limb shows swelling and some mild atrophy of the intrinsic foot musculature along with bluish discoloration and decreased temperature compared the right side. There was allodynia present as well. There is well-healed surgical scar. There was some edema present. She does have good strength.    Ortho Exam Imaging: Xr C-arm No Report  Result Date: 01/12/2017 Please see Notes or Procedures tab for imaging impression.   Past Medical/Family/Surgical/Social History: Medications & Allergies reviewed per EMR Patient Active Problem List   Diagnosis Date Noted  . Complex regional pain syndrome type 1 of left lower extremity 10/09/2016  . Morton neuroma, left    Past Medical History:  Diagnosis Date  . ADD (attention deficit disorder)   . Anxiety   . Arthritis    R knee, sciata treated - injection- 2012  . Complication of anesthesia    used scop. patch in the past  . Depression   . Family history of adverse reaction to anesthesia    N&V  . Headache    migraine, last one 3-4 months ago   .  History of anemia   . History of bronchitis   . History of kidney stones   . Hypertension   . Insomnia    Ambien  . PONV (postoperative nausea and vomiting)    History reviewed. No pertinent family history. Past Surgical History:  Procedure Laterality Date  . APPENDECTOMY    . CESAREAN SECTION    . EXCISION MORTON'S NEUROMA Left 09/29/2016   Procedure: EXCISION MORTON'S NEUROMA LEFT FOOT 3RD WEB SPACE;  Surgeon: MNewt Minion MD;  [place] [facility]  Service: Orthopedics;  Laterality: Left;  . KNEE SURGERY Right 1995 & 2009   ACL repair & arthroscopy -2009  . LAPAROSCOPIC ENDOMETRIOSIS FULGURATION    . OPEN REDUCTION INTERNAL FIXATION (ORIF) FOOT LISFRANC FRACTURE Left 02/18/2016   Procedure: OPEN REDUCTION INTERNAL FIXATION (ORIF) FOOT LISFRANC FRACTURE;  Surgeon: MNewt Minion MD;  [place]  Service: Orthopedics;  Laterality: Left;  . TONSILLECTOMY    .  ULNAR NERVE REPAIR    . WRIST SURGERY Right    torn cartilage    Social History   Occupational History  . Not on file.   Social History Main Topics  . Smoking status: Former Smoker    Packs/day: 0.50  . Smokeless tobacco: Never Used     Comment: Sts she recently quit (08/30/16)  . Alcohol use Yes     Comment: socially- 1-2 drinks per week   . Drug use: No  . Sexual activity: Not on file

## 2017-01-12 NOTE — Patient Instructions (Signed)

## 2017-01-12 NOTE — Procedures (Signed)
Lumbar Sympathetic Block with Fluoroscopic Guidance  Patient: Danielle Harrington      Date of Birth: 1976/03/05 MRN: [id number] PCP: Lynne Logan, MD      Visit Date: 01/12/2017   Universal Protocol:    Date/Time: 03/23/181:09 PM  Consent Given By: the patient  Position: PRONE  Additional Comments: Vital signs were monitored before and after the procedure. Patient was prepped and draped in the usual sterile fashion. The correct patient, procedure, and site was verified.   Injection Procedure Details:  Procedure Site One Meds Administered:  Meds ordered this encounter  Medications  . lidocaine (PF) (XYLOCAINE) 1 % injection 0.3 mL  . bupivacaine (MARCAINE) 0.5 % (with pres) injection 50 mL  . diazepam (VALIUM) 5 MG tablet    Sig: Take 1 by mouth 1 to 2 hours pre-procedure. May repeat if necessary.    Dispense:  2 tablet    Refill:  0     Laterality: Left  Location/Site:  L2-L3  Needle size: 22 G  Needle type: spinal  Needle Placement: Lateral vertebral body  Findings:  -Contrast Used: 2 mL iohexol 180 mg iodine/mL   -Comments: Excellent flow of contrast showing no vascular uptake with spread of medication 1 level above and below the site  Procedure Details: The fluoroscope beam was manipulated to square off the endplates of the L2 vertebral body to achieve a true midline AP view.  An oblique view of the region overlying the inferior portion of the L2 vertebral body with the ipsilateral tip of the transverse process aligned with the ventral edge of the vertebral body was obtained under fluoroscopic visualization. The target is the outer edge of the lower third of the L2 vertebral body.  For the target described the skin was anesthetized with 1 ml of 1% Lidocaine without epinephrine. The spinal needle was inserted down to the inferior anterolateral aspect of the L2 vertebral body using biplanar imaging.   A 99m volume of Omnipaque-240 was injected to make sure that the  needle tip was not in a blood vessel or intra muscular and a standard fascial plane outline was obtained. Bi-planar imaging confirmed placement and appropriate images documented.  The solution noted above was injected in this region slowly in 1 ml aliquots.    Additional Comments:  The patient tolerated the procedure well No complications occurred Dressing: Band-Aid    Post-procedure details: Patient was observed during the procedure. Post-procedure instructions were reviewed.  Patient left the clinic in stable condition.

## 2017-01-15 ENCOUNTER — Ambulatory Visit (INDEPENDENT_AMBULATORY_CARE_PROVIDER_SITE_OTHER): Payer: Self-pay | Admitting: Orthopedic Surgery

## 2017-01-15 VITALS — Ht 65.0 in | Wt 154.0 lb

## 2017-01-15 DIAGNOSIS — G90522 Complex regional pain syndrome I of left lower limb: Secondary | ICD-10-CM

## 2017-01-15 MED ORDER — OXYCODONE-ACETAMINOPHEN 5-325 MG PO TABS: 1.0000 | ORAL_TABLET | ORAL | 0 refills | Status: DC | PRN

## 2017-01-15 NOTE — Progress Notes (Signed)
Office Visit Note   Patient: Danielle Harrington           Date of Birth: 1976/03/17           MRN: [id number] Visit Date: 01/15/2017              Requested by: Donald Prose, MD [place] [place], [place] [address] PCP: Lynne Logan, MD  Chief Complaint  Patient presents with  . Left Foot - Follow-up    HPI: Patient states the last sympathetic block did not provide her any relief. She complains of increasing pain call this to the lower extremity difficulty with weightbearing at this time.  Assessment & Plan: Visit Diagnoses:  1. Complex regional pain syndrome type 1 of left lower extremity     Plan: Patient has a prescription for Toradol a new prescription for Percocet she is taking 600 mg of Neurontin 3 times a day. We have a consultation request and with [facility] for evaluation for ketamine treatment.  Follow-Up Instructions: Return in about 2 weeks (around 01/29/2017).   Ortho Exam  Patient is alert, oriented, no adenopathy, well-dressed, normal affect, normal respiratory effort. Examination patient's left foot is cold compared to the right there is no increased sweating or dryness. There is increased redness in the left foot compared to the right foot. She has increased sensitivity to light touch globally.  Imaging: No results found.  Labs: Lab Results  Component Value Date   REPTSTATUS 02/26/2015 FINAL 02/24/2015   CULT  02/24/2015    LACTOBACILLUS SPECIES Note: Standardized susceptibility testing for this organism is not available. Performed at [facility]     Orders:  No orders of the defined types were placed in this encounter.  Meds ordered this encounter  Medications  . oxyCODONE-acetaminophen (PERCOCET/ROXICET) 5-325 MG tablet    Sig: Take 1 tablet by mouth every 4 (four) hours as needed for severe pain.    Dispense:  60 tablet    Refill:  0     Procedures: No procedures performed  Clinical Data: No  additional findings.  ROS: Review of Systems  Objective: Vital Signs: Ht 5' 5"  (1.651 m)   Wt 154 lb (69.9 kg)   BMI 25.63 kg/m   Specialty Comments:  No specialty comments available.  PMFS History: Patient Active Problem List   Diagnosis Date Noted  . Complex regional pain syndrome type 1 of left lower extremity 10/09/2016  . Morton neuroma, left    Past Medical History:  Diagnosis Date  . ADD (attention deficit disorder)   . Anxiety   . Arthritis    R knee, sciata treated - injection- 2012  . Complication of anesthesia    used scop. patch in the past  . Depression   . Family history of adverse reaction to anesthesia    N&V  . Headache    migraine, last one 3-4 months ago   . History of anemia   . History of bronchitis   . History of kidney stones   . Hypertension   . Insomnia    Ambien  . PONV (postoperative nausea and vomiting)     No family history on file.  Past Surgical History:  Procedure Laterality Date  . APPENDECTOMY    . CESAREAN SECTION    . EXCISION MORTON'S NEUROMA Left 09/29/2016   Procedure: EXCISION MORTON'S NEUROMA LEFT FOOT 3RD WEB SPACE;  Surgeon: Newt Minion, MD;  Location: [facility];  Service:  Orthopedics;  Laterality: Left;  . KNEE SURGERY Right 1995 & 2009   ACL repair & arthroscopy -2009  . LAPAROSCOPIC ENDOMETRIOSIS FULGURATION    . OPEN REDUCTION INTERNAL FIXATION (ORIF) FOOT LISFRANC FRACTURE Left 02/18/2016   Procedure: OPEN REDUCTION INTERNAL FIXATION (ORIF) FOOT LISFRANC FRACTURE;  Surgeon: Newt Minion, MD;  Location: [facility];  Service: Orthopedics;  Laterality: Left;  . TONSILLECTOMY    . ULNAR NERVE REPAIR    . WRIST SURGERY Right    torn cartilage    Social History   Occupational History  . Not on file.   Social History Main Topics  . Smoking status: Former Smoker    Packs/day: 0.50  . Smokeless tobacco: Never Used     Comment: Sts she recently quit (08/30/16)  . Alcohol use Yes     Comment: socially- 1-2 drinks per  week   . Drug use: No  . Sexual activity: Not on file

## 2017-01-16 NOTE — Telephone Encounter (Signed)
Submitted for precert and it was not auto approved like it has been. Says it requires further review. Submitted notes.

## 2017-01-17 ENCOUNTER — Ambulatory Visit (INDEPENDENT_AMBULATORY_CARE_PROVIDER_SITE_OTHER): Payer: Managed Care, Other (non HMO)

## 2017-01-17 ENCOUNTER — Encounter (INDEPENDENT_AMBULATORY_CARE_PROVIDER_SITE_OTHER): Payer: Self-pay | Admitting: Physical Medicine and Rehabilitation

## 2017-01-17 ENCOUNTER — Ambulatory Visit (INDEPENDENT_AMBULATORY_CARE_PROVIDER_SITE_OTHER): Payer: Managed Care, Other (non HMO) | Admitting: Physical Medicine and Rehabilitation

## 2017-01-17 VITALS — BP 124/86 | HR 99

## 2017-01-17 DIAGNOSIS — G90522 Complex regional pain syndrome I of left lower limb: Secondary | ICD-10-CM

## 2017-01-17 MED ORDER — LIDOCAINE HCL (PF) 1 % IJ SOLN
0.3300 mL | Freq: Once | INTRAMUSCULAR | Status: AC
  Administered 2017-01-17: 0.3 mL

## 2017-01-17 MED ORDER — BUPIVACAINE HCL 0.5 % IJ SOLN
50.0000 mL | Freq: Once | INTRAMUSCULAR | Status: AC
  Administered 2017-01-17: 50 mL

## 2017-01-17 NOTE — Progress Notes (Signed)
Danielle Harrington - 41 y.o. female MRN [id number]  Date of birth: 10-10-1976  Office Visit Note: Visit Date: 01/17/2017 PCP: Lynne Logan, MD Referred by: Donald Prose, MD  Subjective: Chief Complaint  Patient presents with  . Left Foot - Pain   HPI: Danielle Harrington is a 41 year old female with continued complex regional pain syndrome and left foot pain. She is here to Repeat Lumbar Sympathetic Block. She states that she did get initial relief the day of the block but did not get much sustained relief.. Pain radiates all over foot and now its starting to go up ankle. She has tingling, numbness and burning. She ambulates with knee scooter. She does some weightbearing.  She takes oxycodone, gabapentin, and toradol which helps some. She is in physical therapy and we are going to write a physical therapy note to extend this to include her calf as the pain syndrome seems to be spreading. There is a consultation referral in for [facility].    ROS Otherwise per HPI.  Assessment & Plan: Visit Diagnoses:  1. Complex regional pain syndrome type 1 of left lower extremity     Plan: Findings:  Left lumbar sympathetic block with fluoroscopic guidance. Block was successful the patient did have increased temperature with almost normal skin color and tone and redness. She did have some relief as she left the office.    Meds & Orders:  Meds ordered this encounter  Medications  . lidocaine (PF) (XYLOCAINE) 1 % injection 0.3 mL  . bupivacaine (MARCAINE) 0.5 % (with pres) injection 50 mL    Orders Placed This Encounter  Procedures  . Nerve Block  . XR C-ARM NO REPORT  . Ambulatory referral to Physical Therapy    Follow-up: Return if symptoms worsen or fail to improve.   Procedures: No procedures performed  Lumbar Sympathetic Block with Fluoroscopic Guidance  Patient: Danielle Harrington      Date of Birth: 1976/01/28 MRN: [id number] PCP: Lynne Logan, MD      Visit Date: 01/17/2017     Universal Protocol:    Date/Time: 03/30/185:59 AM  Consent Given By: the patient  Position: PRONE  Additional Comments: Vital signs were monitored before and after the procedure. Patient was prepped and draped in the usual sterile fashion. The correct patient, procedure, and site was verified.   Injection Procedure Details:  Procedure Site One Meds Administered:  Meds ordered this encounter  Medications  . lidocaine (PF) (XYLOCAINE) 1 % injection 0.3 mL  . bupivacaine (MARCAINE) 0.5 % (with pres) injection 50 mL     Laterality: Left  Location/Site:  L2-L3  Needle size: 22 G  Needle type: spinal  Needle Placement: Lateral vertebral body  Findings:  -Contrast Used: 2 mL iohexol 180 mg iodine/mL   -Comments: There was excellent flow of contrast spanning several vertebral bodies anterior and laterally with no spread consistent with musculature.  Procedure Details: The fluoroscope beam was manipulated to square off the endplates of the L2 vertebral body to achieve a true midline AP view.  An oblique view of the region overlying the inferior portion of the L2 vertebral body with the ipsilateral tip of the transverse process aligned with the ventral edge of the vertebral body was obtained under fluoroscopic visualization. The target is the outer edge of the lower third of the L2 vertebral body.  For the target described the skin was anesthetized with 1 ml of 1% Lidocaine without epinephrine. The spinal needle was inserted down to the  inferior anterolateral aspect of the L2 vertebral body using biplanar imaging.   A 26m volume of Omnipaque-240 was injected to make sure that the needle tip was not in a blood vessel or intra muscular and a standard fascial plane outline was obtained. Bi-planar imaging confirmed placement and appropriate images documented.  The solution noted above was injected in this region slowly in 1 ml aliquots.    Additional Comments:  The patient tolerated  the procedure well Dressing: Band-Aid    Post-procedure details: Patient was observed during the procedure. Post-procedure instructions were reviewed.  Patient left the clinic in stable condition.   Clinical History: No specialty comments available.  She reports that she has quit smoking. She smoked 0.50 packs per day. She has never used smokeless tobacco. No results for input(s): HGBA1C, LABURIC in the last 8760 hours.  Objective:  VS:  HT:    WT:   BMI:     BP:124/86  HR:99bpm  TEMP: ( )  RESP:  Physical Exam  Musculoskeletal:  Left lower extremity shows swelling and edema as well as discoloration and coolness of temperature compared to the right. There does not seem to be any excessive nail growth. There is some mild atrophy of the intrinsic foot muscles.    Ortho Exam Imaging: No results found.  Past Medical/Family/Surgical/Social History: Medications & Allergies reviewed per EMR Patient Active Problem List   Diagnosis Date Noted  . Complex regional pain syndrome type 1 of left lower extremity 10/09/2016  . Morton neuroma, left    Past Medical History:  Diagnosis Date  . ADD (attention deficit disorder)   . Anxiety   . Arthritis    R knee, sciata treated - injection- 2012  . Complication of anesthesia    used scop. patch in the past  . Depression   . Family history of adverse reaction to anesthesia    N&V  . Headache    migraine, last one 3-4 months ago   . History of anemia   . History of bronchitis   . History of kidney stones   . Hypertension   . Insomnia    Ambien  . PONV (postoperative nausea and vomiting)    No family history on file. Past Surgical History:  Procedure Laterality Date  . APPENDECTOMY    . CESAREAN SECTION    . EXCISION MORTON'S NEUROMA Left 09/29/2016   Procedure: EXCISION MORTON'S NEUROMA LEFT FOOT 3RD WEB SPACE;  Surgeon: MNewt Minion MD;  Location[facility]  Service: Orthopedics;  Laterality: Left;  . KNEE SURGERY Right 1995  & 2009   ACL repair & arthroscopy -2009  . LAPAROSCOPIC ENDOMETRIOSIS FULGURATION    . OPEN REDUCTION INTERNAL FIXATION (ORIF) FOOT LISFRANC FRACTURE Left 02/18/2016   Procedure: OPEN REDUCTION INTERNAL FIXATION (ORIF) FOOT LISFRANC FRACTURE;  Surgeon: MNewt Minion MD;  [facility]  Service: Orthopedics;  Laterality: Left;  . TONSILLECTOMY    . ULNAR NERVE REPAIR    . WRIST SURGERY Right    torn cartilage    Social History   Occupational History  . Not on file.   Social History Main Topics  . Smoking status: Former Smoker    Packs/day: 0.50  . Smokeless tobacco: Never Used     Comment: Sts she recently quit (08/30/16)  . Alcohol use Yes     Comment: socially- 1-2 drinks per week   . Drug use: No  . Sexual activity: Not on file

## 2017-01-17 NOTE — Patient Instructions (Signed)

## 2017-01-17 NOTE — Telephone Encounter (Signed)
Received request for additional records from [employer]. Faxed all notes. Dr. Ernestina Patches is aware that the procedure today has not been approved and wanted to proceed with injection.

## 2017-01-19 NOTE — Procedures (Signed)
Lumbar Sympathetic Block with Fluoroscopic Guidance  Patient: Danielle Harrington      Date of Birth: [birth year] MRN: [id number] PCP: Lynne Logan, MD      Visit Date: 01/17/2017   Universal Protocol:    Date/Time: 03/30/185:59 AM  Consent Given By: the patient  Position: PRONE  Additional Comments: Vital signs were monitored before and after the procedure. Patient was prepped and draped in the usual sterile fashion. The correct patient, procedure, and site was verified.   Injection Procedure Details:  Procedure Site One Meds Administered:  Meds ordered this encounter  Medications  . lidocaine (PF) (XYLOCAINE) 1 % injection 0.3 mL  . bupivacaine (MARCAINE) 0.5 % (with pres) injection 50 mL     Laterality: Left  Location/Site:  L2-L3  Needle size: 22 G  Needle type: spinal  Needle Placement: Lateral vertebral body  Findings:  -Contrast Used: 2 mL iohexol 180 mg iodine/mL   -Comments: There was excellent flow of contrast spanning several vertebral bodies anterior and laterally with no spread consistent with musculature.  Procedure Details: The fluoroscope beam was manipulated to square off the endplates of the L2 vertebral body to achieve a true midline AP view.  An oblique view of the region overlying the inferior portion of the L2 vertebral body with the ipsilateral tip of the transverse process aligned with the ventral edge of the vertebral body was obtained under fluoroscopic visualization. The target is the outer edge of the lower third of the L2 vertebral body.  For the target described the skin was anesthetized with 1 ml of 1% Lidocaine without epinephrine. The spinal needle was inserted down to the inferior anterolateral aspect of the L2 vertebral body using biplanar imaging.   A 45m volume of Omnipaque-240 was injected to make sure that the needle tip was not in a blood vessel or intra muscular and a standard fascial plane outline was obtained. Bi-planar imaging  confirmed placement and appropriate images documented.  The solution noted above was injected in this region slowly in 1 ml aliquots.    Additional Comments:  The patient tolerated the procedure well Dressing: Band-Aid    Post-procedure details: Patient was observed during the procedure. Post-procedure instructions were reviewed.  Patient left the clinic in stable condition.

## 2017-01-23 ENCOUNTER — Ambulatory Visit (INDEPENDENT_AMBULATORY_CARE_PROVIDER_SITE_OTHER): Payer: Managed Care, Other (non HMO) | Admitting: Orthopedic Surgery

## 2017-01-23 ENCOUNTER — Encounter (INDEPENDENT_AMBULATORY_CARE_PROVIDER_SITE_OTHER): Payer: Self-pay | Admitting: Orthopedic Surgery

## 2017-01-23 ENCOUNTER — Ambulatory Visit (INDEPENDENT_AMBULATORY_CARE_PROVIDER_SITE_OTHER): Payer: Self-pay | Admitting: Orthopedic Surgery

## 2017-01-23 DIAGNOSIS — G90522 Complex regional pain syndrome I of left lower limb: Secondary | ICD-10-CM

## 2017-01-23 MED ORDER — ALPRAZOLAM 0.5 MG PO TABS: 0.5000 mg | ORAL_TABLET | Freq: Three times a day (TID) | ORAL | 0 refills | Status: DC | PRN

## 2017-01-23 NOTE — Progress Notes (Signed)
Office Visit Note   Patient: Danielle Harrington           Date of Birth: May 30, 1976           MRN: [id number] Visit Date: 01/23/2017              Requested by: Donald Prose, MD [address], [place] [address] PCP: Lynne Logan, MD  Chief Complaint  Patient presents with  . Left Foot - Pain      HPI: Patient states that she had excellent pain relief from her block last week but she states that it wore off Sunday complains of excruciating pain today Monday. Patient states the pain woke her up at 4:00 in the morning. Patient is a Radiographer, therapeutic with a kneeling scooter she states she wants to cut her foot off. She states that she took 600 mg of Neurontin 3 Percocets this morning without relief.  Assessment & Plan: Visit Diagnoses:  1. Complex regional pain syndrome type 1 of left lower extremity     Plan: I have put a request for repeat sympathetic block 7 days out from her last block. We will follow up with [facility] for evaluation for the ketamine treatment. A prescription was called in for Xanax 0.5 mg 3 times a day.  Follow-Up Instructions: Return in about 2 weeks (around 02/06/2017).   Ortho Exam  Patient is alert, oriented, no adenopathy, well-dressed, normal affect, normal respiratory effort. On examination patient is alert oriented no adenopathy she is in mild distress. Her right foot is warm her left foot is cool to the touch. There is no skin changes with increased moisture or dryness. The foot is more red than the right foot. There is no swelling no open ulcers no cellulitis. She does have hypersensitivity to light touch.  Imaging: No results found.  Labs: Lab Results  Component Value Date   REPTSTATUS 02/26/2015 FINAL 02/24/2015   CULT  02/24/2015    LACTOBACILLUS SPECIES Note: Standardized susceptibility testing for this organism is not available. Performed at [facility]     Orders:  No orders of the defined types were  placed in this encounter.  Meds ordered this encounter  Medications  . ALPRAZolam (XANAX) 0.5 MG tablet    Sig: Take 1 tablet (0.5 mg total) by mouth 3 (three) times daily as needed for anxiety.    Dispense:  60 tablet    Refill:  0     Procedures: No procedures performed  Clinical Data: No additional findings.  ROS:  All other systems negative, except as noted in the HPI. Review of Systems  Objective: Vital Signs: There were no vitals taken for this visit.  Specialty Comments:  No specialty comments available.  PMFS History: Patient Active Problem List   Diagnosis Date Noted  . Complex regional pain syndrome type 1 of left lower extremity 10/09/2016  . Morton neuroma, left    Past Medical History:  Diagnosis Date  . ADD (attention deficit disorder)   . Anxiety   . Arthritis    R knee, sciata treated - injection- 2012  . Complication of anesthesia    used scop. patch in the past  . Depression   . Family history of adverse reaction to anesthesia    N&V  . Headache    migraine, last one 3-4 months ago   . History of anemia   . History of bronchitis   . History of kidney stones   .  Hypertension   . Insomnia    Ambien  . PONV (postoperative nausea and vomiting)     History reviewed. No pertinent family history.  Past Surgical History:  Procedure Laterality Date  . APPENDECTOMY    . CESAREAN SECTION    . EXCISION MORTON'S NEUROMA Left 09/29/2016   Procedure: EXCISION MORTON'S NEUROMA LEFT FOOT 3RD WEB SPACE;  Surgeon: Newt Minion, MD;  Location: [facility];  Service: Orthopedics;  Laterality: Left;  . KNEE SURGERY Right 1995 & 2009   ACL repair & arthroscopy -2009  . LAPAROSCOPIC ENDOMETRIOSIS FULGURATION    . OPEN REDUCTION INTERNAL FIXATION (ORIF) FOOT LISFRANC FRACTURE Left 02/18/2016   Procedure: OPEN REDUCTION INTERNAL FIXATION (ORIF) FOOT LISFRANC FRACTURE;  Surgeon: Newt Minion, MD;  Location: [facility];  Service: Orthopedics;  Laterality: Left;  .  TONSILLECTOMY    . ULNAR NERVE REPAIR    . WRIST SURGERY Right    torn cartilage    Social History   Occupational History  . Not on file.   Social History Main Topics  . Smoking status: Former Smoker    Packs/day: 0.50  . Smokeless tobacco: Never Used     Comment: Sts she recently quit (08/30/16)  . Alcohol use Yes     Comment: socially- 1-2 drinks per week   . Drug use: No  . Sexual activity: Not on file

## 2017-01-23 NOTE — Telephone Encounter (Signed)
This injectio was denied and Dr. Ernestina Patches is aware

## 2017-01-24 ENCOUNTER — Ambulatory Visit (INDEPENDENT_AMBULATORY_CARE_PROVIDER_SITE_OTHER): Payer: Managed Care, Other (non HMO)

## 2017-01-24 ENCOUNTER — Encounter (INDEPENDENT_AMBULATORY_CARE_PROVIDER_SITE_OTHER): Payer: Self-pay | Admitting: Physical Medicine and Rehabilitation

## 2017-01-24 ENCOUNTER — Other Ambulatory Visit (INDEPENDENT_AMBULATORY_CARE_PROVIDER_SITE_OTHER): Payer: Self-pay | Admitting: Orthopedic Surgery

## 2017-01-24 ENCOUNTER — Ambulatory Visit (INDEPENDENT_AMBULATORY_CARE_PROVIDER_SITE_OTHER): Payer: Managed Care, Other (non HMO) | Admitting: Physical Medicine and Rehabilitation

## 2017-01-24 VITALS — BP 127/99 | HR 95

## 2017-01-24 DIAGNOSIS — G90522 Complex regional pain syndrome I of left lower limb: Secondary | ICD-10-CM | POA: Diagnosis not present

## 2017-01-24 MED ORDER — OXYCODONE-ACETAMINOPHEN 5-325 MG PO TABS: 1.0000 | ORAL_TABLET | ORAL | 0 refills | Status: DC | PRN

## 2017-01-24 MED ORDER — DEXAMETHASONE SODIUM PHOSPHATE 10 MG/ML IJ SOLN
15.0000 mg | Freq: Once | INTRAMUSCULAR | Status: AC
  Administered 2017-01-24: 15 mg

## 2017-01-24 MED ORDER — BUPIVACAINE HCL 0.5 % IJ SOLN
50.0000 mL | Freq: Once | INTRAMUSCULAR | Status: AC
  Administered 2017-01-24: 50 mL

## 2017-01-24 MED ORDER — LIDOCAINE HCL (PF) 1 % IJ SOLN
0.3300 mL | Freq: Once | INTRAMUSCULAR | Status: AC
  Administered 2017-01-24: 0.3 mL

## 2017-01-24 NOTE — Progress Notes (Signed)
Danielle Harrington - 41 y.o. female MRN [id number]  Date of birth: 03-29-1976  Office Visit Note: Visit Date: 01/24/2017 PCP: Lynne Logan, MD Referred by: Donald Prose, MD  Subjective: Chief Complaint  Patient presents with  . Left Foot - Pain   HPI: [name] is a 41 year old female that we've seeing now about weekly for lumbar sympathetic block on the left. She gets a good block most of the time and gets good relief but only lasting about a week. She states that she had excellent pain relief from her block last week but she states that it wore off Sunday complains of excruciating pain since 4:00 Tuesday morning. There is a referral and to [facility] and this is being followed by Dr. Sharol Given staff. I think she is needing of more definitive treatment whether that be ketamine versus medication management versus spinal cord stimulator trial. Her history is reviewed in our prior notes.    ROS Otherwise per HPI.  Assessment & Plan: Visit Diagnoses:  1. Complex regional pain syndrome type 1 of left lower extremity     Plan: Findings:  Successful left lumbar sympathetic block. Patient did have good recent temperature and coloration of her foot return to normal.    Meds & Orders:  Meds ordered this encounter  Medications  . lidocaine (PF) (XYLOCAINE) 1 % injection 0.3 mL  . bupivacaine (MARCAINE) 0.5 % (with pres) injection 50 mL  . dexamethasone (DECADRON) injection 15 mg    Orders Placed This Encounter  Procedures  . Nerve Block  . XR C-ARM NO REPORT    Follow-up: Return if symptoms worsen or fail to improve.   Procedures: No procedures performed  Lumbar Sympathetic Block with Fluoroscopic Guidance  Patient: Danielle Harrington      Date of Birth: Nov 17, 1975 MRN: [id number] PCP: Lynne Logan, MD      Visit Date: 01/24/2017   Universal Protocol:    Date/Time: 04/05/186:44 AM  Consent Given By: the patient  Position: PRONE  Additional Comments: Vital signs  were monitored before and after the procedure. Patient was prepped and draped in the usual sterile fashion. The correct patient, procedure, and site was verified.   Injection Procedure Details:  Procedure Site One Meds Administered:  Meds ordered this encounter  Medications  . lidocaine (PF) (XYLOCAINE) 1 % injection 0.3 mL  . bupivacaine (MARCAINE) 0.5 % (with pres) injection 50 mL  . dexamethasone (DECADRON) injection 15 mg     Laterality: Left  Location/Site:  L2-L3  Needle size: 22 G  Needle type: spinal  Needle Placement: Lateral vertebral body  Findings:  -Contrast Used: 2 mL iohexol 180 mg iodine/mL   -Comments: Excellent flow of contrast along the ventral lateral aspect of the vertebral body without intravascular or intramuscular flow pattern  Procedure Details: The fluoroscope beam was manipulated to square off the endplates of the L2 vertebral body to achieve a true midline AP view.  An oblique view of the region overlying the inferior portion of the L2 vertebral body with the ipsilateral tip of the transverse process aligned with the ventral edge of the vertebral body was obtained under fluoroscopic visualization. The target is the outer edge of the lower third of the L2 vertebral body.  For the target described the skin was anesthetized with 1 ml of 1% Lidocaine without epinephrine. The spinal needle was inserted down to the inferior anterolateral aspect of the L2 vertebral body using biplanar imaging.   A 60m volume  of Omnipaque-240 was injected to make sure that the needle tip was not in a blood vessel or intra muscular and a standard fascial plane outline was obtained. Bi-planar imaging confirmed placement and appropriate images documented.  The solution noted above was injected in this region slowly in 1 ml aliquots.    Additional Comments:  The patient tolerated the procedure well Dressing: Band-Aid    Post-procedure details: Patient was observed during  the procedure. Post-procedure instructions were reviewed.  Patient left the clinic in stable condition.   Clinical History: No specialty comments available.  She reports that she has quit smoking. She smoked 0.50 packs per day. She has never used smokeless tobacco. No results for input(s): HGBA1C, LABURIC in the last 8760 hours.  Objective:  VS:  HT:    WT:   BMI:     BP:(!) 127/99  HR:95bpm  TEMP: ( )  RESP:  Physical Exam  Musculoskeletal:  Left foot shows discoloration as well as mild edema and allodynia.    Ortho Exam Imaging: Xr C-arm No Report  Result Date: 01/24/2017 Please see Notes or Procedures tab for imaging impression.   Past Medical/Family/Surgical/Social History: Medications & Allergies reviewed per EMR Patient Active Problem List   Diagnosis Date Noted  . Complex regional pain syndrome type 1 of left lower extremity 10/09/2016  . Morton neuroma, left    Past Medical History:  Diagnosis Date  . ADD (attention deficit disorder)   . Anxiety   . Arthritis    R knee, sciata treated - injection- 2012  . Complication of anesthesia    used scop. patch in the past  . Depression   . Family history of adverse reaction to anesthesia    N&V  . Headache    migraine, last one 3-4 months ago   . History of anemia   . History of bronchitis   . History of kidney stones   . Hypertension   . Insomnia    Ambien  . PONV (postoperative nausea and vomiting)    History reviewed. No pertinent family history. Past Surgical History:  Procedure Laterality Date  . APPENDECTOMY    . CESAREAN SECTION    . EXCISION MORTON'S NEUROMA Left 09/29/2016   Procedure: EXCISION MORTON'S NEUROMA LEFT FOOT 3RD WEB SPACE;  Surgeon: Newt Minion, MD;  Location: [facility];  Service: Orthopedics;  Laterality: Left;  . KNEE SURGERY Right 1995 & 2009   ACL repair & arthroscopy -2009  . LAPAROSCOPIC ENDOMETRIOSIS FULGURATION    . OPEN REDUCTION INTERNAL FIXATION (ORIF) FOOT LISFRANC FRACTURE  Left 02/18/2016   Procedure: OPEN REDUCTION INTERNAL FIXATION (ORIF) FOOT LISFRANC FRACTURE;  Surgeon: Newt Minion, MD;  Location: [facility];  Service: Orthopedics;  Laterality: Left;  . TONSILLECTOMY    . ULNAR NERVE REPAIR    . WRIST SURGERY Right    torn cartilage    Social History   Occupational History  . Not on file.   Social History Main Topics  . Smoking status: Former Smoker    Packs/day: 0.50  . Smokeless tobacco: Never Used     Comment: Sts she recently quit (08/30/16)  . Alcohol use Yes     Comment: socially- 1-2 drinks per week   . Drug use: No  . Sexual activity: Not on file

## 2017-01-24 NOTE — Patient Instructions (Signed)

## 2017-01-25 ENCOUNTER — Telehealth (INDEPENDENT_AMBULATORY_CARE_PROVIDER_SITE_OTHER): Payer: Self-pay

## 2017-01-25 NOTE — Procedures (Signed)
Lumbar Sympathetic Block with Fluoroscopic Guidance  Patient: Danielle Harrington      Date of Birth: 1976-09-25 MRN: [id number] PCP: Lynne Logan, MD      Visit Date: 01/24/2017   Universal Protocol:    Date/Time: 04/05/186:44 AM  Consent Given By: the patient  Position: PRONE  Additional Comments: Vital signs were monitored before and after the procedure. Patient was prepped and draped in the usual sterile fashion. The correct patient, procedure, and site was verified.   Injection Procedure Details:  Procedure Site One Meds Administered:  Meds ordered this encounter  Medications  . lidocaine (PF) (XYLOCAINE) 1 % injection 0.3 mL  . bupivacaine (MARCAINE) 0.5 % (with pres) injection 50 mL  . dexamethasone (DECADRON) injection 15 mg     Laterality: Left  Location/Site:  L2-L3  Needle size: 22 G  Needle type: spinal  Needle Placement: Lateral vertebral body  Findings:  -Contrast Used: 2 mL iohexol 180 mg iodine/mL   -Comments: Excellent flow of contrast along the ventral lateral aspect of the vertebral body without intravascular or intramuscular flow pattern  Procedure Details: The fluoroscope beam was manipulated to square off the endplates of the L2 vertebral body to achieve a true midline AP view.  An oblique view of the region overlying the inferior portion of the L2 vertebral body with the ipsilateral tip of the transverse process aligned with the ventral edge of the vertebral body was obtained under fluoroscopic visualization. The target is the outer edge of the lower third of the L2 vertebral body.  For the target described the skin was anesthetized with 1 ml of 1% Lidocaine without epinephrine. The spinal needle was inserted down to the inferior anterolateral aspect of the L2 vertebral body using biplanar imaging.   A 19m volume of Omnipaque-240 was injected to make sure that the needle tip was not in a blood vessel or intra muscular and a standard fascial plane  outline was obtained. Bi-planar imaging confirmed placement and appropriate images documented.  The solution noted above was injected in this region slowly in 1 ml aliquots.    Additional Comments:  The patient tolerated the procedure well Dressing: Band-Aid    Post-procedure details: Patient was observed during the procedure. Post-procedure instructions were reviewed.  Patient left the clinic in stable condition.

## 2017-01-25 NOTE — Telephone Encounter (Signed)
Patient called and wanted to know if the referral for Ketamine treatment was done. It was done 01/09/17 and faxed on 01/11/17. Patient wanted me to refax referral but this time to fax number [phone] -  [facility]- Dr Veverly Fells. Faxed referral

## 2017-01-26 ENCOUNTER — Other Ambulatory Visit (INDEPENDENT_AMBULATORY_CARE_PROVIDER_SITE_OTHER): Payer: Self-pay | Admitting: Physical Medicine and Rehabilitation

## 2017-01-26 ENCOUNTER — Telehealth (INDEPENDENT_AMBULATORY_CARE_PROVIDER_SITE_OTHER): Payer: Self-pay | Admitting: Radiology

## 2017-01-26 MED ORDER — DIAZEPAM 5 MG PO TABS: ORAL_TABLET | ORAL | 0 refills | Status: DC

## 2017-01-26 NOTE — Telephone Encounter (Signed)
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 The company will allow me to rent a fully electric scooter to ride to visit the customers I need to see in [place] as well as being able to be in our temporary office I'm the [employer] building but I must have explicit release that I would be capable with the use of the scooter. Please let me know if you have any questions on this as my HR department needs it as soon as possible. I'm trying to make the best out of this awful situation of CR[employer] and I hope we can kick it's butt soon instead of it kicking me down all the time. Your guys, Dr. Sharol Given, Dr. Ernestina Patches and all the staff have truly been amazing to me & I sincerely appreciate it. My # is [phone] if you need anymore information to what my Hr dept needs for my files  Thanks again. It can be emailed to me at [email or web address] and cc [email or web address]. Thanks

## 2017-01-26 NOTE — Telephone Encounter (Signed)
Okay for  note for patient to use a motorized scooter to work at [employer]

## 2017-01-26 NOTE — Progress Notes (Signed)
Re-order pre-procedure valium

## 2017-01-29 NOTE — Telephone Encounter (Signed)
Letter emailed to patient at both addresses below.

## 2017-01-30 ENCOUNTER — Other Ambulatory Visit (INDEPENDENT_AMBULATORY_CARE_PROVIDER_SITE_OTHER): Payer: Self-pay | Admitting: Orthopedic Surgery

## 2017-02-01 ENCOUNTER — Other Ambulatory Visit (INDEPENDENT_AMBULATORY_CARE_PROVIDER_SITE_OTHER): Payer: Self-pay | Admitting: Family

## 2017-02-01 ENCOUNTER — Ambulatory Visit (INDEPENDENT_AMBULATORY_CARE_PROVIDER_SITE_OTHER): Payer: Managed Care, Other (non HMO)

## 2017-02-01 ENCOUNTER — Ambulatory Visit (INDEPENDENT_AMBULATORY_CARE_PROVIDER_SITE_OTHER): Payer: Managed Care, Other (non HMO) | Admitting: Physical Medicine and Rehabilitation

## 2017-02-01 ENCOUNTER — Encounter (INDEPENDENT_AMBULATORY_CARE_PROVIDER_SITE_OTHER): Payer: Self-pay | Admitting: Physical Medicine and Rehabilitation

## 2017-02-01 VITALS — BP 138/87

## 2017-02-01 DIAGNOSIS — G90522 Complex regional pain syndrome I of left lower limb: Secondary | ICD-10-CM | POA: Diagnosis not present

## 2017-02-01 MED ORDER — LIDOCAINE HCL (PF) 1 % IJ SOLN
0.3300 mL | Freq: Once | INTRAMUSCULAR | Status: AC
  Administered 2017-02-01: 0.3 mL

## 2017-02-01 MED ORDER — BUPIVACAINE HCL 0.5 % IJ SOLN
50.0000 mL | Freq: Once | INTRAMUSCULAR | Status: AC
  Administered 2017-02-01: 50 mL

## 2017-02-01 MED ORDER — OXYCODONE-ACETAMINOPHEN 5-325 MG PO TABS: 1.0000 | ORAL_TABLET | ORAL | 0 refills | Status: DC | PRN

## 2017-02-01 NOTE — Procedures (Signed)
Lumbar Sympathetic Block with Fluoroscopic Guidance  Patient: Danielle Harrington      Date of Birth: 01/24/1976 MRN: [id number] PCP: Lynne Logan, MD      Visit Date: 02/01/2017   Universal Protocol:    Date/Time: 04/12/182:41 PM  Consent Given By: the patient  Position: PRONE  Additional Comments: Vital signs were monitored before and after the procedure. Patient was prepped and draped in the usual sterile fashion. The correct patient, procedure, and site was verified.   Injection Procedure Details:  Procedure Site One Meds Administered:  Meds ordered this encounter  Medications  . lidocaine (PF) (XYLOCAINE) 1 % injection 0.3 mL  . bupivacaine (MARCAINE) 0.5 % (with pres) injection 50 mL     Laterality: Left  Location/Site:  L2-L3  Needle size: 22 G  Needle type: spinal  Needle Placement: Lateral vertebral body  Findings:  -Contrast Used: 2 mL iohexol 180 mg iodine/mL   -Comments: There was excellent flow of contrast at the ventral aspect of the L2 vertebral body with good flow down one level with no intravascular or intramuscular flow.  Procedure Details: The fluoroscope beam was manipulated to square off the endplates of the L2 vertebral body to achieve a true midline AP view.  An oblique view of the region overlying the inferior portion of the L2 vertebral body with the ipsilateral tip of the transverse process aligned with the ventral edge of the vertebral body was obtained under fluoroscopic visualization. The target is the outer edge of the lower third of the L2 vertebral body.  For the target described the skin was anesthetized with 1 ml of 1% Lidocaine without epinephrine. The spinal needle was inserted down to the inferior anterolateral aspect of the L2 vertebral body using biplanar imaging.   A 68m volume of Omnipaque-240 was injected to make sure that the needle tip was not in a blood vessel or intra muscular and a standard fascial plane outline was obtained.  Bi-planar imaging confirmed placement and appropriate images documented.  The solution noted above was injected in this region slowly in 1 ml aliquots.    Additional Comments:  The patient tolerated the procedure well Dressing: Band-Aid    Post-procedure details: Patient was observed during the procedure. Post-procedure instructions were reviewed.  Patient left the clinic in stable condition.

## 2017-02-01 NOTE — Patient Instructions (Signed)

## 2017-02-01 NOTE — Progress Notes (Signed)
ABAGALE BOULOS - 41 y.o. female MRN [id number]  Date of birth: 11-11-1975  Office Visit Note: Visit Date: 02/01/2017 PCP: Lynne Logan, MD Referred by: Donald Prose, MD  Subjective: Chief Complaint  Patient presents with  . Left Foot - Pain   HPI: Ms. Mccolm is a 41 year old female with complex regional pain syndrome of the left lower extremity Patient states she had a couple days of relief with the last lumbar sympathetic block last week. She now has an appointment set up for Monday at [facility] with Dr. Mechele Dawley for further management. She has failed other conservative care including physical therapy and medication management and sympathetic blocks.    ROS Otherwise per HPI.  Assessment & Plan: Visit Diagnoses:  1. Complex regional pain syndrome type 1 of left lower extremity     Plan: Findings:  Left lumbar sympathetic block with fluoroscopic guidance.    Meds & Orders:  Meds ordered this encounter  Medications  . lidocaine (PF) (XYLOCAINE) 1 % injection 0.3 mL  . bupivacaine (MARCAINE) 0.5 % (with pres) injection 50 mL    Orders Placed This Encounter  Procedures  . Nerve Block  . XR C-ARM NO REPORT    Follow-up: Return if symptoms worsen or fail to improve.   Procedures: No procedures performed  Lumbar Sympathetic Block with Fluoroscopic Guidance  Patient: JEZELLE GULLICK      Date of Birth: 01-Feb-1976 MRN: [id number] PCP: Lynne Logan, MD      Visit Date: 02/01/2017   Universal Protocol:    Date/Time: 04/12/182:41 PM  Consent Given By: the patient  Position: PRONE  Additional Comments: Vital signs were monitored before and after the procedure. Patient was prepped and draped in the usual sterile fashion. The correct patient, procedure, and site was verified.   Injection Procedure Details:  Procedure Site One Meds Administered:  Meds ordered this encounter  Medications  . lidocaine (PF) (XYLOCAINE) 1 % injection 0.3 mL  . bupivacaine  (MARCAINE) 0.5 % (with pres) injection 50 mL     Laterality: Left  Location/Site:  L2-L3  Needle size: 22 G  Needle type: spinal  Needle Placement: Lateral vertebral body  Findings:  -Contrast Used: 2 mL iohexol 180 mg iodine/mL   -Comments: There was excellent flow of contrast at the ventral aspect of the L2 vertebral body with good flow down one level with no intravascular or intramuscular flow.  Procedure Details: The fluoroscope beam was manipulated to square off the endplates of the L2 vertebral body to achieve a true midline AP view.  An oblique view of the region overlying the inferior portion of the L2 vertebral body with the ipsilateral tip of the transverse process aligned with the ventral edge of the vertebral body was obtained under fluoroscopic visualization. The target is the outer edge of the lower third of the L2 vertebral body.  For the target described the skin was anesthetized with 1 ml of 1% Lidocaine without epinephrine. The spinal needle was inserted down to the inferior anterolateral aspect of the L2 vertebral body using biplanar imaging.   A 75m volume of Omnipaque-240 was injected to make sure that the needle tip was not in a blood vessel or intra muscular and a standard fascial plane outline was obtained. Bi-planar imaging confirmed placement and appropriate images documented.  The solution noted above was injected in this region slowly in 1 ml aliquots.    Additional Comments:  The patient tolerated the procedure well Dressing: Band-Aid  Post-procedure details: Patient was observed during the procedure. Post-procedure instructions were reviewed.  Patient left the clinic in stable condition.   Clinical History: No specialty comments available.  She reports that she has quit smoking. She smoked 0.50 packs per day. She has never used smokeless tobacco. No results for input(s): HGBA1C, LABURIC in the last 8760 hours.  Objective:  VS:  HT:    WT:    BMI:     BP:138/87  HR: bpm  TEMP: ( )  RESP:  Physical Exam  Musculoskeletal:  Pt still using rolling leg scooter. Left foot with allodynia and cool temperature but better coloration today. Still neurologically intact. No significant edema, mild.    Ortho Exam Imaging: No results found.  Past Medical/Family/Surgical/Social History: Medications & Allergies reviewed per EMR Patient Active Problem List   Diagnosis Date Noted  . Complex regional pain syndrome type 1 of left lower extremity 10/09/2016  . Morton neuroma, left    Past Medical History:  Diagnosis Date  . ADD (attention deficit disorder)   . Anxiety   . Arthritis    R knee, sciata treated - injection- 2012  . Complication of anesthesia    used scop. patch in the past  . Depression   . Family history of adverse reaction to anesthesia    N&V  . Headache    migraine, last one 3-4 months ago   . History of anemia   . History of bronchitis   . History of kidney stones   . Hypertension   . Insomnia    Ambien  . PONV (postoperative nausea and vomiting)    History reviewed. No pertinent family history. Past Surgical History:  Procedure Laterality Date  . APPENDECTOMY    . CESAREAN SECTION    . EXCISION MORTON'S NEUROMA Left 09/29/2016   Procedure: EXCISION MORTON'S NEUROMA LEFT FOOT 3RD WEB SPACE;  Surgeon: Newt Minion, MD;  Location: [place];  Service: Orthopedics;  Laterality: Left;  . KNEE SURGERY Right 1995 & 2009   ACL repair & arthroscopy -2009  . LAPAROSCOPIC ENDOMETRIOSIS FULGURATION    . OPEN REDUCTION INTERNAL FIXATION (ORIF) FOOT LISFRANC FRACTURE Left 02/18/2016   Procedure: OPEN REDUCTION INTERNAL FIXATION (ORIF) FOOT LISFRANC FRACTURE;  Surgeon: Newt Minion, MD;  Location: [place];  Service: Orthopedics;  Laterality: Left;  . TONSILLECTOMY    . ULNAR NERVE REPAIR    . WRIST SURGERY Right    torn cartilage    Social History   Occupational History  . Not on file.   Social History Main Topics    . Smoking status: Former Smoker    Packs/day: 0.50  . Smokeless tobacco: Never Used     Comment: Sts she recently quit (08/30/16)  . Alcohol use Yes     Comment: socially- 1-2 drinks per week   . Drug use: No  . Sexual activity: Not on file

## 2017-02-05 ENCOUNTER — Ambulatory Visit (INDEPENDENT_AMBULATORY_CARE_PROVIDER_SITE_OTHER): Payer: Managed Care, Other (non HMO) | Admitting: Orthopedic Surgery

## 2017-02-06 ENCOUNTER — Telehealth (INDEPENDENT_AMBULATORY_CARE_PROVIDER_SITE_OTHER): Payer: Self-pay | Admitting: *Deleted

## 2017-02-06 NOTE — Telephone Encounter (Signed)
U call patient. I would be happy to see her if there is anything we could do to change her treatment otherwise I am looking forward to her results from the ketamine treatment

## 2017-02-06 NOTE — Telephone Encounter (Signed)
Patient called in this morning in regards to some concerns. She is going to go ahead and start the [place] treatment. She is going to start and admitted on April 30th, She will be in the ICU 5 to 7 days and she just wanted to know if Dr. Sharol Given would still want to see her this week or not? Thank you Her CB # [phone].

## 2017-02-06 NOTE — Telephone Encounter (Signed)
I called and spoke with Danielle Harrington.  Advised of Dr. Jess Barters message.  She states that she would like to go ahead and cancel her existing appt and we will wait and see how she does with the ketamine treatment.

## 2017-02-08 ENCOUNTER — Ambulatory Visit (INDEPENDENT_AMBULATORY_CARE_PROVIDER_SITE_OTHER): Payer: Managed Care, Other (non HMO) | Admitting: Orthopedic Surgery

## 2017-02-16 ENCOUNTER — Other Ambulatory Visit (INDEPENDENT_AMBULATORY_CARE_PROVIDER_SITE_OTHER): Payer: Self-pay | Admitting: Orthopedic Surgery

## 2017-02-16 ENCOUNTER — Telehealth (INDEPENDENT_AMBULATORY_CARE_PROVIDER_SITE_OTHER): Payer: Self-pay | Admitting: Orthopedic Surgery

## 2017-02-16 NOTE — Telephone Encounter (Signed)
Patient called needing Rx refilled Xanax. The number to contact patient is [phone]

## 2017-02-19 ENCOUNTER — Other Ambulatory Visit (INDEPENDENT_AMBULATORY_CARE_PROVIDER_SITE_OTHER): Payer: Self-pay | Admitting: Orthopedic Surgery

## 2017-02-19 NOTE — Telephone Encounter (Signed)
Pt called again about Xanax refill please. She is completely out.

## 2017-02-20 ENCOUNTER — Other Ambulatory Visit (INDEPENDENT_AMBULATORY_CARE_PROVIDER_SITE_OTHER): Payer: Self-pay

## 2017-02-20 ENCOUNTER — Other Ambulatory Visit (INDEPENDENT_AMBULATORY_CARE_PROVIDER_SITE_OTHER): Payer: Self-pay | Admitting: Orthopedic Surgery

## 2017-02-20 MED ORDER — ALPRAZOLAM 0.5 MG PO TABS: 0.5000 mg | ORAL_TABLET | Freq: Three times a day (TID) | ORAL | 0 refills | Status: DC | PRN

## 2017-02-20 NOTE — Telephone Encounter (Signed)
Duplicate message. 

## 2017-02-20 NOTE — Telephone Encounter (Signed)
Patient calling again about her xanax refill, states she hasn't heard back from anyone

## 2017-04-07 ENCOUNTER — Ambulatory Visit (HOSPITAL_COMMUNITY)
Admission: EM | Admit: 2017-04-07 | Discharge: 2017-04-07 | Disposition: A | Discharge status: Home / Self Care | Payer: Managed Care, Other (non HMO) | Attending: Family Medicine | Admitting: Family Medicine

## 2017-04-07 ENCOUNTER — Emergency Department (HOSPITAL_COMMUNITY)
Admission: EM | Admit: 2017-04-07 | Discharge: 2017-04-08 | Disposition: A | Discharge status: Home / Self Care | Payer: Managed Care, Other (non HMO) | Attending: Emergency Medicine | Admitting: Emergency Medicine

## 2017-04-07 ENCOUNTER — Encounter (HOSPITAL_COMMUNITY): Payer: Self-pay

## 2017-04-07 ENCOUNTER — Encounter (HOSPITAL_COMMUNITY): Payer: Self-pay | Admitting: Emergency Medicine

## 2017-04-07 DIAGNOSIS — I10 Essential (primary) hypertension: Secondary | ICD-10-CM | POA: Insufficient documentation

## 2017-04-07 DIAGNOSIS — R1030 Lower abdominal pain, unspecified: Secondary | ICD-10-CM | POA: Diagnosis not present

## 2017-04-07 DIAGNOSIS — F909 Attention-deficit hyperactivity disorder, unspecified type: Secondary | ICD-10-CM | POA: Insufficient documentation

## 2017-04-07 DIAGNOSIS — R5381 Other malaise: Secondary | ICD-10-CM | POA: Diagnosis not present

## 2017-04-07 DIAGNOSIS — M545 Low back pain, unspecified: Secondary | ICD-10-CM

## 2017-04-07 DIAGNOSIS — R310 Gross hematuria: Secondary | ICD-10-CM

## 2017-04-07 DIAGNOSIS — Z79899 Other long term (current) drug therapy: Secondary | ICD-10-CM | POA: Diagnosis not present

## 2017-04-07 DIAGNOSIS — R5383 Other fatigue: Secondary | ICD-10-CM

## 2017-04-07 DIAGNOSIS — R102 Pelvic and perineal pain: Secondary | ICD-10-CM

## 2017-04-07 DIAGNOSIS — Z87891 Personal history of nicotine dependence: Secondary | ICD-10-CM | POA: Insufficient documentation

## 2017-04-07 DIAGNOSIS — N939 Abnormal uterine and vaginal bleeding, unspecified: Secondary | ICD-10-CM

## 2017-04-07 DIAGNOSIS — Z87442 Personal history of urinary calculi: Secondary | ICD-10-CM | POA: Insufficient documentation

## 2017-04-07 DIAGNOSIS — R319 Hematuria, unspecified: Secondary | ICD-10-CM | POA: Diagnosis present

## 2017-04-07 DIAGNOSIS — R509 Fever, unspecified: Secondary | ICD-10-CM | POA: Diagnosis not present

## 2017-04-07 LAB — POCT URINALYSIS DIP (DEVICE)
Bilirubin Urine: NEGATIVE
Glucose, UA: NEGATIVE mg/dL
KETONES UR: NEGATIVE mg/dL
Leukocytes, UA: NEGATIVE
Nitrite: NEGATIVE
PH: 5.5 (ref 5.0–8.0)
PROTEIN: 30 mg/dL — AB
SPECIFIC GRAVITY, URINE: 1.025 (ref 1.005–1.030)
Urobilinogen, UA: 0.2 mg/dL (ref 0.0–1.0)

## 2017-04-07 LAB — BASIC METABOLIC PANEL
Anion gap: 7 (ref 5–15)
BUN: <5 mg/dL — ABNORMAL LOW (ref 6–20)
CO2: 26 mmol/L (ref 22–32)
CREATININE: 0.55 mg/dL (ref 0.44–1.00)
Calcium: 8.6 mg/dL — ABNORMAL LOW (ref 8.9–10.3)
Chloride: 99 mmol/L — ABNORMAL LOW (ref 101–111)
GFR calc Af Amer: >60 mL/min (ref >60)
GLUCOSE: 131 mg/dL — AB (ref 65–99)
Potassium: 3.3 mmol/L — ABNORMAL LOW (ref 3.5–5.1)
SODIUM: 132 mmol/L — AB (ref 135–145)

## 2017-04-07 LAB — WET PREP, GENITAL
CLUE CELLS WET PREP: NONE SEEN
SPERM: NONE SEEN
Trich, Wet Prep: NONE SEEN
Yeast Wet Prep HPF POC: NONE SEEN

## 2017-04-07 LAB — CBC WITH DIFFERENTIAL/PLATELET
Basophils Absolute: 0 10*3/uL (ref 0.0–0.1)
Basophils Relative: 0 %
EOS ABS: 0.3 10*3/uL (ref 0.0–0.7)
EOS PCT: 3 %
HCT: 35.9 % — ABNORMAL LOW (ref 36.0–46.0)
Hemoglobin: 11.6 g/dL — ABNORMAL LOW (ref 12.0–15.0)
LYMPHS ABS: 2.3 10*3/uL (ref 0.7–4.0)
LYMPHS PCT: 28 %
MCH: 30.9 pg (ref 26.0–34.0)
MCHC: 32.3 g/dL (ref 30.0–36.0)
MCV: 95.7 fL (ref 78.0–100.0)
MONO ABS: 0.4 10*3/uL (ref 0.1–1.0)
MONOS PCT: 5 %
Neutro Abs: 5.2 10*3/uL (ref 1.7–7.7)
Neutrophils Relative %: 64 %
PLATELETS: 291 10*3/uL (ref 150–400)
RBC: 3.75 MIL/uL — ABNORMAL LOW (ref 3.87–5.11)
RDW: 12.8 % (ref 11.5–15.5)
WBC: 8.3 10*3/uL (ref 4.0–10.5)

## 2017-04-07 LAB — I-STAT BETA HCG BLOOD, ED (MC, WL, AP ONLY)

## 2017-04-07 MED ORDER — ACETAMINOPHEN 500 MG PO TABS
1000.0000 mg | ORAL_TABLET | Freq: Once | ORAL | Status: AC
  Administered 2017-04-07: 1000 mg via ORAL
  Filled 2017-04-07: qty 2

## 2017-04-07 MED ORDER — ONDANSETRON 4 MG PO TBDP
4.0000 mg | ORAL_TABLET | Freq: Once | ORAL | Status: AC
Start: 2017-04-07 — End: 2017-04-07
  Administered 2017-04-07: 4 mg via ORAL
  Filled 2017-04-07: qty 1

## 2017-04-07 MED ORDER — SODIUM CHLORIDE 0.9 % IV BOLUS (SEPSIS)
1000.0000 mL | Freq: Once | INTRAVENOUS | Status: AC
  Administered 2017-04-07: 1000 mL via INTRAVENOUS

## 2017-04-07 NOTE — ED Provider Notes (Signed)
[facility] DEPT Provider Note   CSN: [id number] Arrival date & time: 04/07/17  1955     History   Chief Complaint Chief Complaint  Patient presents with  . Hematuria    HPI Danielle Harrington is a 41 y.o. female.  HPI  Patient is a 40 year old female with past medical history significant for complex regional pain syndrome, anxiety, who presents to the emergency department with concerns for hematuria. Patient states that she has had 3 days of a small amount of bleeding with wiping. Noted some clots in the toilet. Today she had a mild headache, dull, nonradiating. Noted some low-grade fever, Tmax 100, so was concerned she had an infection somewhere. Endorses some mild lower abdominal pain. Denies chills, nausea, vomiting, vaginal discharge, diarrhea, bloody stool. Not on any anticoagulation. States she has not had her period in 1 year and she is on a progesterone birth control pill. States she was recently on antibiotics for a nerve stimulator trial. Denies recent falls or head trauma. No altered mental status or neck stiffness. Denies change in vision, slurring of speech, upper or lower extremity weakness. He was initially seen at an urgent care and was sent to the emergency department due to concerns for possible kidney stone.  Past Medical History:  Diagnosis Date  . ADD (attention deficit disorder)   . Anxiety   . Arthritis    R knee, sciata treated - injection- 2012  . Complication of anesthesia    used scop. patch in the past  . Depression   . Family history of adverse reaction to anesthesia    N&V  . Headache    migraine, last one 3-4 months ago   . History of anemia   . History of bronchitis   . History of kidney stones   . Hypertension   . Insomnia    Ambien  . PONV (postoperative nausea and vomiting)     Patient Active Problem List   Diagnosis Date Noted  . Complex regional pain syndrome type 1 of left lower extremity 10/09/2016  . Morton neuroma, left      Past Surgical History:  Procedure Laterality Date  . APPENDECTOMY    . CESAREAN SECTION    . EXCISION MORTON'S NEUROMA Left 09/29/2016   Procedure: EXCISION MORTON'S NEUROMA LEFT FOOT 3RD WEB SPACE;  Surgeon: Newt Minion, MD;  Location: [place];  Service: Orthopedics;  Laterality: Left;  . KNEE SURGERY Right 1995 & 2009   ACL repair & arthroscopy -2009  . LAPAROSCOPIC ENDOMETRIOSIS FULGURATION    . OPEN REDUCTION INTERNAL FIXATION (ORIF) FOOT LISFRANC FRACTURE Left 02/18/2016   Procedure: OPEN REDUCTION INTERNAL FIXATION (ORIF) FOOT LISFRANC FRACTURE;  Surgeon: Newt Minion, MD;  Location: [place];  Service: Orthopedics;  Laterality: Left;  . TONSILLECTOMY    . ULNAR NERVE REPAIR    . WRIST SURGERY Right    torn cartilage     OB History    No data available       Home Medications    Prior to Admission medications   Medication Sig Start Date End Date Taking? Authorizing Provider  ALPRAZolam Danielle Harrington) 0.5 MG tablet Take 1 tablet (0.5 mg total) by mouth 3 (three) times daily as needed for anxiety. 02/20/17   Newt Minion, MD  ALPRAZolam Danielle Harrington) 0.5 MG tablet 1/2 -1 TABLET ONCE A DAY AS NEEDED WITH MONTHLY LOCKOUT BY MOUTH (30 DAY SUPPLY) 02/21/17   Newt Minion, MD  buPROPion (WELLBUTRIN XL) 300 MG 24  hr tablet Take 300 mg by mouth daily before breakfast.  02/01/15   [provider]  celecoxib (CELEBREX) 200 MG capsule  07/23/16   [provider]  citalopram (CELEXA) 20 MG tablet Take 20 mg by mouth at bedtime.  01/13/15   [provider]  folic acid (FOLVITE) 1 MG tablet Take 1 mg by mouth daily. 06/08/16   [provider]  gabapentin (NEURONTIN) 300 MG capsule Take 1 capsule (300 mg total) by mouth 3 (three) times daily. 08/31/16   Newt Minion, MD  gabapentin (NEURONTIN) 300 MG capsule Take 2 capsules (600 mg total) by mouth 3 (three) times daily. 3 times a day when necessary neuropathy pain 11/11/16   Newt Minion, MD  ibuprofen (ADVIL,MOTRIN)  200 MG tablet Take 400 mg by mouth every 6 (six) hours as needed for moderate pain (pain).    [provider]  levocetirizine (XYZAL) 5 MG tablet Take 5 mg by mouth every evening. 01/08/15   [provider]  losartan-hydrochlorothiazide (HYZAAR) 100-12.5 MG per tablet Take 1 tablet by mouth daily. 01/08/15   [provider]  methadone (DOLOPHINE) 5 MG tablet Take 5 mg by mouth every 8 (eight) hours.    [provider]  Multiple Vitamin (MULTIVITAMIN) tablet Take 1 tablet by mouth daily.    [provider]  nortriptyline (PAMELOR) 25 MG capsule Take 25 mg by mouth at bedtime.    [provider]  YASMIN 28 3-0.03 MG tablet Take 1 tablet by mouth daily.  01/05/15   [provider]  zolpidem (AMBIEN) 10 MG tablet Take 5-10 mg by mouth at bedtime. SLEEP DEPENDS ON DIFFICULTY SLEEPING 01/11/15   [provider]    Family History History reviewed. No pertinent family history.  Social History Social History  Substance Use Topics  . Smoking status: Former Smoker    Packs/day: 0.50  . Smokeless tobacco: Never Used     Comment: Sts she recently quit (08/30/16)  . Alcohol use Yes     Comment: socially- 1-2 drinks per week      Allergies   Adhesive [tape]   Review of Systems Review of Systems  Constitutional: Negative for appetite change, chills and fever.  HENT: Negative for congestion and trouble swallowing.   Respiratory: Negative for cough, chest tightness and shortness of breath.   Cardiovascular: Negative for chest pain.  Gastrointestinal: Negative for abdominal pain, blood in stool, diarrhea, nausea and vomiting.  Genitourinary: Positive for hematuria, pelvic pain and vaginal bleeding. Negative for difficulty urinating, dysuria and flank pain.  Musculoskeletal: Negative for back pain.  Skin: Negative for rash.  Neurological: Negative for dizziness, seizures, speech difficulty, weakness and light-headedness.   Psychiatric/Behavioral: Negative for behavioral problems.     Physical Exam Updated Vital Signs BP 126/74 (BP Location: Right Arm)   Pulse 80   Temp 99.4 F (37.4 C) (Oral)   Resp 16   Ht 5' 4"  (1.626 m)   Wt 72.6 kg (160 lb)   SpO2 99%   BMI 27.46 kg/m   Physical Exam  Constitutional: She is oriented to person, place, and time. She appears well-developed and well-nourished. No distress.  HENT:  Head: Atraumatic.  Mouth/Throat: Oropharynx is clear and moist.  Eyes: Conjunctivae and EOM are normal. Pupils are equal, round, and reactive to light.  Neck: Normal range of motion. Neck supple.  Cardiovascular: Normal rate, regular rhythm, normal heart sounds and intact distal pulses.   Pulmonary/Chest: Effort normal and breath sounds  normal. No respiratory distress. She has no wheezes.  Abdominal: She exhibits no distension and no mass. There is tenderness ( Mild suprapubic tenderness to palpation). There is no guarding.  Negative McBurney's point, no CVA tenderness, negative Murphy sign.  Genitourinary: Uterus normal. There is no rash on the right labia. There is no rash on the left labia. Cervix exhibits no motion tenderness, no discharge and no friability. Right adnexum displays no mass, no tenderness and no fullness. Left adnexum displays no mass, no tenderness and no fullness. There is bleeding in the vagina.  Genitourinary Comments: Scant vaginal bleeding. No active bleeding. No discharge.  Musculoskeletal: Normal range of motion.  Lymphadenopathy:       Right: No inguinal adenopathy present.       Left: No inguinal adenopathy present.  Neurological: She is alert and oriented to person, place, and time.  Skin: Skin is warm. No pallor.  Psychiatric: She has a normal mood and affect.     ED Treatments / Results  Labs (all labs ordered are listed, but only abnormal results are displayed) Labs Reviewed  WET PREP, GENITAL - Abnormal; Notable for the following:       Result  Value   WBC, Wet Prep HPF POC MANY (*)    All other components within normal limits  CBC WITH DIFFERENTIAL/PLATELET - Abnormal; Notable for the following:    RBC 3.75 (*)    Hemoglobin 11.6 (*)    HCT 35.9 (*)    All other components within normal limits  BASIC METABOLIC PANEL - Abnormal; Notable for the following:    Sodium 132 (*)    Potassium 3.3 (*)    Chloride 99 (*)    Glucose, Bld 131 (*)    BUN <5 (*)    Calcium 8.6 (*)    All other components within normal limits  I-STAT BETA HCG BLOOD, ED ([facility], [facility], [facility] ONLY)  GC/CHLAMYDIA PROBE AMP ([place]) NOT AT [facility]    EKG  EKG Interpretation None       Radiology No results found.  Procedures Procedures (including critical care time)  Medications Ordered in ED Medications  acetaminophen (TYLENOL) tablet 1,000 mg (1,000 mg Oral Given 04/07/17 2156)  ondansetron (ZOFRAN-ODT) disintegrating tablet 4 mg (4 mg Oral Given 04/07/17 2157)  sodium chloride 0.9 % bolus 1,000 mL (0 mLs Intravenous Stopped 04/08/17 0120)     Initial Impression / Assessment and Plan / ED Course  I have reviewed the triage vital signs and the nursing notes.  Pertinent labs & imaging results that were available during my care of the patient were reviewed by me and considered in my medical decision making (see chart for details).    Patient is a 41 year old female with past medical history significant for prior appendectomy, who presents to the emergency department with concern for hematuria and fever. UA at urgent care showed no signs of infectious process, moderate blood. No acute distress, not ill appearing. Afebrile, hemodynamically stable. Patient with vaginal bleeding on exam, no CMT, no adnexal fullness. Benign abdominal exam without peritonitis.  Patient most likely having her period causing lower abdominal discomfort and mild back discomfort. Hemoglobin 11.6. Urine pregnancy negative. Wet prep negative for Trichomonas. GC/Chlamydia swab  obtained. No leukocytosis or significant electrolyte abnormalities. Clinical picture is not consistent with pyelonephritis, no CVA tenderness, UA without signs of infection. Doubt nephrolithiasis, does not appear uncomfortable, no recent history. No signs of meningitis, no neck stiffness, no altered mental status. Patient given Tylenol  with improvement of her pain. Do not feel that CT imaging is necessary at this time.  Patient stable for discharge home. Discussed follow-up with her primary care physician and OB/GYN. Patient expressed understanding, no questions or concerns at time of discharge.  Final Clinical Impressions(s) / ED Diagnoses   Final diagnoses:  Vaginal bleeding    New Prescriptions Discharge Medication List as of 04/07/2017 11:36 PM       Nathaniel Man, MD 04/08/17 1529    Lajean Saver, MD 04/08/17 1544

## 2017-04-07 NOTE — ED Triage Notes (Addendum)
Pt here for abd pain onset 4 days associated w/hematuria and clots  Sx also include urinary freq  Denies dysuria, fevers  A&O x4... NAD.[facility]

## 2017-04-07 NOTE — ED Provider Notes (Deleted)
CSN: [id number]     Arrival date & time 04/07/17  1955 History   None    Chief Complaint  Patient presents with  . Hematuria   (Consider location/radiation/quality/duration/timing/severity/associated sxs/prior Treatment) HPI  Past Medical History:  Diagnosis Date  . ADD (attention deficit disorder)   . Anxiety   . Arthritis    R knee, sciata treated - injection- 2012  . Complication of anesthesia    used scop. patch in the past  . Depression   . Family history of adverse reaction to anesthesia    N&V  . Headache    migraine, last one 3-4 months ago   . History of anemia   . History of bronchitis   . History of kidney stones   . Hypertension   . Insomnia    Ambien  . PONV (postoperative nausea and vomiting)    Past Surgical History:  Procedure Laterality Date  . APPENDECTOMY    . CESAREAN SECTION    . EXCISION MORTON'S NEUROMA Left 09/29/2016   Procedure: EXCISION MORTON'S NEUROMA LEFT FOOT 3RD WEB SPACE;  Surgeon: Newt Minion, MD;  Location: [facility];  Service: Orthopedics;  Laterality: Left;  . KNEE SURGERY Right 1995 & 2009   ACL repair & arthroscopy -2009  . LAPAROSCOPIC ENDOMETRIOSIS FULGURATION    . OPEN REDUCTION INTERNAL FIXATION (ORIF) FOOT LISFRANC FRACTURE Left 02/18/2016   Procedure: OPEN REDUCTION INTERNAL FIXATION (ORIF) FOOT LISFRANC FRACTURE;  Surgeon: Newt Minion, MD;  Location: [facility];  Service: Orthopedics;  Laterality: Left;  . TONSILLECTOMY    . ULNAR NERVE REPAIR    . WRIST SURGERY Right    torn cartilage    History reviewed. No pertinent family history. Social History  Substance Use Topics  . Smoking status: Former Smoker    Packs/day: 0.50  . Smokeless tobacco: Never Used     Comment: Sts she recently quit (08/30/16)  . Alcohol use Yes     Comment: socially- 1-2 drinks per week    OB History    No data available     Review of Systems  Allergies  Adhesive [tape]  Home Medications   Prior to Admission medications   Medication Sig  Start Date End Date Taking? Authorizing Provider  ALPRAZolam Duanne Moron) 0.5 MG tablet Take 1 tablet (0.5 mg total) by mouth 3 (three) times daily as needed for anxiety. 02/20/17   Newt Minion, MD  ALPRAZolam Duanne Moron) 0.5 MG tablet 1/2 -1 TABLET ONCE A DAY AS NEEDED WITH MONTHLY LOCKOUT BY MOUTH (30 DAY SUPPLY) 02/21/17   Newt Minion, MD  buPROPion (WELLBUTRIN XL) 300 MG 24 hr tablet Take 300 mg by mouth daily before breakfast.  02/01/15   [provider]  celecoxib (CELEBREX) 200 MG capsule  07/23/16   [provider]  citalopram (CELEXA) 20 MG tablet Take 20 mg by mouth at bedtime.  01/13/15   [provider]  folic acid (FOLVITE) 1 MG tablet Take 1 mg by mouth daily. 06/08/16   [provider]  gabapentin (NEURONTIN) 300 MG capsule Take 1 capsule (300 mg total) by mouth 3 (three) times daily. 08/31/16   Newt Minion, MD  gabapentin (NEURONTIN) 300 MG capsule Take 2 capsules (600 mg total) by mouth 3 (three) times daily. 3 times a day when necessary neuropathy pain 11/11/16   Newt Minion, MD  ibuprofen (ADVIL,MOTRIN) 200 MG tablet Take 400 mg by mouth every 6 (six) hours as needed for moderate pain (pain).  [provider]  levocetirizine (XYZAL) 5 MG tablet Take 5 mg by mouth every evening. 01/08/15   [provider]  losartan-hydrochlorothiazide (HYZAAR) 100-12.5 MG per tablet Take 1 tablet by mouth daily. 01/08/15   [provider]  methadone (DOLOPHINE) 5 MG tablet Take 5 mg by mouth every 8 (eight) hours.    [provider]  Multiple Vitamin (MULTIVITAMIN) tablet Take 1 tablet by mouth daily.    [provider]  nortriptyline (PAMELOR) 25 MG capsule Take 25 mg by mouth at bedtime.    [provider]  YASMIN 28 3-0.03 MG tablet Take 1 tablet by mouth daily.  01/05/15   [provider]  zolpidem (AMBIEN) 10 MG tablet Take 5-10 mg by mouth at bedtime. SLEEP DEPENDS ON DIFFICULTY SLEEPING 01/11/15    [provider]   Meds Ordered and Administered this Visit  Medications - No data to display  BP 125/72 (BP Location: Left Arm)   Pulse 92   Temp 99.4 F (37.4 C) (Oral)   Resp 16   Ht 5' 4"  (1.626 m)   Wt 160 lb (72.6 kg)   SpO2 98%   BMI 27.46 kg/m  No data found.   Physical Exam  Urgent Care Course     Procedures (including critical care time)  Labs Review Labs Reviewed - No data to display  Imaging Review No results found.   Visual Acuity Review  Right Eye Distance:   Left Eye Distance:   Bilateral Distance:    Right Eye Near:   Left Eye Near:    Bilateral Near:         MDM  No diagnosis found.     Janne Napoleon, NP 04/07/17 2141

## 2017-04-07 NOTE — ED Provider Notes (Signed)
CSN: [id number]     Arrival date & time 04/07/17  1735 History   First MD Initiated Contact with Patient 04/07/17 1915     Chief Complaint  Patient presents with  . Abdominal Pain   (Consider location/radiation/quality/duration/timing/severity/associated sxs/prior Treatment) 41 year old female complaining of "3 days of spotting". She is uncertain as to the origin of the bleeding. It was not until the second day where she noticed gross hematuria with clots. She is complaining of low back pain, headache, fatigue and malaise, pain in the low back exacerbated by movement, pelvic pain, lower abdominal pain. She has a history of endometriosis, appendicitis, and C-section. Current temperature 100 pulse 108. Urinalysis is positive for moderate amount of blood.  History is significant for ADD, anxiety, migraine headache, history of kidney stone, complex regional pain syndrome, hypertension, insomnia, anemia.      Past Medical History:  Diagnosis Date  . ADD (attention deficit disorder)   . Anxiety   . Arthritis    R knee, sciata treated - injection- 2012  . Complication of anesthesia    used scop. patch in the past  . Depression   . Family history of adverse reaction to anesthesia    N&V  . Headache    migraine, last one 3-4 months ago   . History of anemia   . History of bronchitis   . History of kidney stones   . Hypertension   . Insomnia    Ambien  . PONV (postoperative nausea and vomiting)    Past Surgical History:  Procedure Laterality Date  . APPENDECTOMY    . CESAREAN SECTION    . EXCISION MORTON'S NEUROMA Left 09/29/2016   Procedure: EXCISION MORTON'S NEUROMA LEFT FOOT 3RD WEB SPACE;  Surgeon: Newt Minion, MD;  Location: [facility];  Service: Orthopedics;  Laterality: Left;  . KNEE SURGERY Right 1995 & 2009   ACL repair & arthroscopy -2009  . LAPAROSCOPIC ENDOMETRIOSIS FULGURATION    . OPEN REDUCTION INTERNAL FIXATION (ORIF) FOOT LISFRANC FRACTURE Left 02/18/2016   Procedure: OPEN REDUCTION INTERNAL FIXATION (ORIF) FOOT LISFRANC FRACTURE;  Surgeon: Newt Minion, MD;  Location: [facility];  Service: Orthopedics;  Laterality: Left;  . TONSILLECTOMY    . ULNAR NERVE REPAIR    . WRIST SURGERY Right    torn cartilage    History reviewed. No pertinent family history. Social History  Substance Use Topics  . Smoking status: Former Smoker    Packs/day: 0.50  . Smokeless tobacco: Never Used     Comment: Sts she recently quit (08/30/16)  . Alcohol use Yes     Comment: socially- 1-2 drinks per week    OB History    No data available     Review of Systems  Constitutional: Positive for activity change, appetite change, fatigue and fever.  HENT: Negative.   Respiratory: Negative.  Negative for shortness of breath.   Cardiovascular: Negative for chest pain and leg swelling.  Gastrointestinal: Positive for abdominal pain and constipation. Negative for vomiting.  Genitourinary: Positive for hematuria and pelvic pain. Negative for dysuria, frequency and vaginal discharge.  Musculoskeletal: Positive for back pain and myalgias.  Neurological: Positive for headaches.  All other systems reviewed and are negative.   Allergies  Adhesive [tape]  Home Medications   Prior to Admission medications   Medication Sig Start Date End Date Taking? Authorizing Provider  ALPRAZolam Duanne Moron) 0.5 MG tablet Take 1 tablet (0.5 mg total) by mouth 3 (three) times daily as needed for anxiety.  02/20/17  Yes Newt Minion, MD  ALPRAZolam Duanne Moron) 0.5 MG tablet 1/2 -1 TABLET ONCE A DAY AS NEEDED WITH MONTHLY LOCKOUT BY MOUTH (30 DAY SUPPLY) 02/21/17  Yes Newt Minion, MD  buPROPion (WELLBUTRIN XL) 300 MG 24 hr tablet Take 300 mg by mouth daily before breakfast.  02/01/15  Yes [provider]  celecoxib (CELEBREX) 200 MG capsule  07/23/16  Yes [provider]  citalopram (CELEXA) 20 MG tablet Take 20 mg by mouth at bedtime.  01/13/15  Yes [provider]  folic  acid (FOLVITE) 1 MG tablet Take 1 mg by mouth daily. 06/08/16  Yes [provider]  gabapentin (NEURONTIN) 300 MG capsule Take 1 capsule (300 mg total) by mouth 3 (three) times daily. 08/31/16  Yes Newt Minion, MD  gabapentin (NEURONTIN) 300 MG capsule Take 2 capsules (600 mg total) by mouth 3 (three) times daily. 3 times a day when necessary neuropathy pain 11/11/16  Yes Newt Minion, MD  levocetirizine (XYZAL) 5 MG tablet Take 5 mg by mouth every evening. 01/08/15  Yes [provider]  losartan-hydrochlorothiazide (HYZAAR) 100-12.5 MG per tablet Take 1 tablet by mouth daily. 01/08/15  Yes [provider]  methadone (DOLOPHINE) 5 MG tablet Take 5 mg by mouth every 8 (eight) hours.   Yes [provider]  Multiple Vitamin (MULTIVITAMIN) tablet Take 1 tablet by mouth daily.   Yes [provider]  nortriptyline (PAMELOR) 25 MG capsule Take 25 mg by mouth at bedtime.   Yes [provider]  YASMIN 28 3-0.03 MG tablet Take 1 tablet by mouth daily.  01/05/15  Yes [provider]  zolpidem (AMBIEN) 10 MG tablet Take 5-10 mg by mouth at bedtime. SLEEP DEPENDS ON DIFFICULTY SLEEPING 01/11/15  Yes [provider]  ibuprofen (ADVIL,MOTRIN) 200 MG tablet Take 400 mg by mouth every 6 (six) hours as needed for moderate pain (pain).    [provider]   Meds Ordered and Administered this Visit  Medications - No data to display  BP 124/79 (BP Location: Left Arm)   Pulse (!) 108   Temp 100 F (37.8 C) (Oral)   Resp 16   SpO2 97%  No data found.   Physical Exam  Constitutional: She is oriented to person, place, and time. She appears well-developed and well-nourished. No distress.  Eyes: EOM are normal.  Neck: Neck supple.  Cardiovascular: Regular rhythm and intact distal pulses.    tachycardia at 112 apical.  Pulmonary/Chest: Effort normal.  Inspiratory breath sounds are normal. Expiratory breath sounds somewhat diminished.  No wheezing or crackles.  Abdominal: Soft. Bowel sounds are normal. She exhibits no distension and no mass. There is no rebound.  No tenderness to the upper quadrants or epigastrium. Positive for tenderness across the lower abdomen greatest at the midline within the suprapubic area.  Musculoskeletal: Normal range of motion.  Neurological: She is alert and oriented to person, place, and time.  Skin: Skin is warm and dry.  Psychiatric: She has a normal mood and affect.  Nursing note and vitals reviewed.   Urgent Care Course     Procedures (including critical care time)  Labs Review Labs Reviewed  POCT URINALYSIS DIP (DEVICE) - Abnormal; Notable for the following:       Result Value   Hgb urine dipstick MODERATE (*)    Protein, ur 30 (*)    All other components within normal limits    Imaging Review No results found.  Visual Acuity Review  Right Eye Distance:   Left Eye Distance:   Bilateral Distance:    Right Eye Near:   Left Eye Near:    Bilateral Near:         MDM   1. Lower abdominal pain   2. Gross hematuria   3. Fever, unspecified   4. Pelvic pain in female   5. Acute bilateral low back pain without sciatica   6. Malaise and fatigue    Transfer to [facility] emergency department via shuttle. Patient is currently stable. Fever of uncertain etiology. Gross hematuria, lower abdominal pain, suprapubic pain, pelvic pain, acute bilateral low back pain, fatigue and malaise with history of abdominal surgeries, kidney stone, complex regional pain syndrome and endometriosis among others.    Janne Napoleon, NP 04/07/17 1949

## 2017-04-07 NOTE — ED Triage Notes (Signed)
Pt complaining of blood in urine. Pt denies any painful urination, states some frequency.

## 2017-04-09 LAB — GC/CHLAMYDIA PROBE AMP (~~LOC~~) NOT AT ARMC
CHLAMYDIA, DNA PROBE: NEGATIVE
Neisseria Gonorrhea: NEGATIVE

## 2017-09-18 DIAGNOSIS — G90522 Complex regional pain syndrome I of left lower limb: Secondary | ICD-10-CM | POA: Diagnosis not present

## 2017-10-09 DIAGNOSIS — G894 Chronic pain syndrome: Secondary | ICD-10-CM | POA: Diagnosis not present

## 2017-10-23 HISTORY — PX: SPINAL CORD STIMULATOR IMPLANT: SHX2422

## 2017-10-24 DIAGNOSIS — G894 Chronic pain syndrome: Secondary | ICD-10-CM | POA: Diagnosis not present

## 2017-10-31 DIAGNOSIS — Z3009 Encounter for other general counseling and advice on contraception: Secondary | ICD-10-CM | POA: Diagnosis not present

## 2017-11-14 DIAGNOSIS — G894 Chronic pain syndrome: Secondary | ICD-10-CM | POA: Diagnosis not present

## 2017-11-14 DIAGNOSIS — M549 Dorsalgia, unspecified: Secondary | ICD-10-CM | POA: Diagnosis not present

## 2017-11-14 DIAGNOSIS — G90522 Complex regional pain syndrome I of left lower limb: Secondary | ICD-10-CM | POA: Diagnosis not present

## 2017-12-03 DIAGNOSIS — G894 Chronic pain syndrome: Secondary | ICD-10-CM | POA: Diagnosis not present

## 2017-12-03 DIAGNOSIS — M549 Dorsalgia, unspecified: Secondary | ICD-10-CM | POA: Diagnosis not present

## 2017-12-03 DIAGNOSIS — G90522 Complex regional pain syndrome I of left lower limb: Secondary | ICD-10-CM | POA: Diagnosis not present

## 2017-12-24 DIAGNOSIS — J209 Acute bronchitis, unspecified: Secondary | ICD-10-CM | POA: Diagnosis not present

## 2017-12-31 DIAGNOSIS — G90522 Complex regional pain syndrome I of left lower limb: Secondary | ICD-10-CM | POA: Diagnosis not present

## 2017-12-31 DIAGNOSIS — G894 Chronic pain syndrome: Secondary | ICD-10-CM | POA: Diagnosis not present

## 2017-12-31 DIAGNOSIS — M549 Dorsalgia, unspecified: Secondary | ICD-10-CM | POA: Diagnosis not present

## 2018-01-24 DIAGNOSIS — G4700 Insomnia, unspecified: Secondary | ICD-10-CM | POA: Diagnosis not present

## 2018-01-24 DIAGNOSIS — F324 Major depressive disorder, single episode, in partial remission: Secondary | ICD-10-CM | POA: Diagnosis not present

## 2018-01-24 DIAGNOSIS — F419 Anxiety disorder, unspecified: Secondary | ICD-10-CM | POA: Diagnosis not present

## 2018-01-24 DIAGNOSIS — I10 Essential (primary) hypertension: Secondary | ICD-10-CM | POA: Diagnosis not present

## 2018-01-29 DIAGNOSIS — Z3200 Encounter for pregnancy test, result unknown: Secondary | ICD-10-CM | POA: Diagnosis not present

## 2018-01-29 DIAGNOSIS — N930 Postcoital and contact bleeding: Secondary | ICD-10-CM | POA: Diagnosis not present

## 2018-01-29 DIAGNOSIS — R109 Unspecified abdominal pain: Secondary | ICD-10-CM | POA: Diagnosis not present

## 2018-02-07 DIAGNOSIS — N938 Other specified abnormal uterine and vaginal bleeding: Secondary | ICD-10-CM | POA: Diagnosis not present

## 2018-02-07 DIAGNOSIS — N809 Endometriosis, unspecified: Secondary | ICD-10-CM | POA: Diagnosis not present

## 2018-02-25 DIAGNOSIS — G90522 Complex regional pain syndrome I of left lower limb: Secondary | ICD-10-CM | POA: Diagnosis not present

## 2018-02-25 DIAGNOSIS — G894 Chronic pain syndrome: Secondary | ICD-10-CM | POA: Diagnosis not present

## 2018-02-25 DIAGNOSIS — M549 Dorsalgia, unspecified: Secondary | ICD-10-CM | POA: Diagnosis not present

## 2018-03-25 DIAGNOSIS — G894 Chronic pain syndrome: Secondary | ICD-10-CM | POA: Diagnosis not present

## 2018-03-25 DIAGNOSIS — G90522 Complex regional pain syndrome I of left lower limb: Secondary | ICD-10-CM | POA: Diagnosis not present

## 2018-03-25 DIAGNOSIS — Z79899 Other long term (current) drug therapy: Secondary | ICD-10-CM | POA: Diagnosis not present

## 2018-03-25 DIAGNOSIS — Z5181 Encounter for therapeutic drug level monitoring: Secondary | ICD-10-CM | POA: Diagnosis not present

## 2018-04-17 DIAGNOSIS — G90522 Complex regional pain syndrome I of left lower limb: Secondary | ICD-10-CM | POA: Diagnosis not present

## 2018-04-17 DIAGNOSIS — G894 Chronic pain syndrome: Secondary | ICD-10-CM | POA: Diagnosis not present

## 2018-04-17 DIAGNOSIS — M549 Dorsalgia, unspecified: Secondary | ICD-10-CM | POA: Diagnosis not present

## 2018-05-08 ENCOUNTER — Ambulatory Visit (HOSPITAL_COMMUNITY)
Admission: EM | Admit: 2018-05-08 | Discharge: 2018-05-08 | Disposition: A | Discharge status: Home / Self Care | Payer: BLUE CROSS/BLUE SHIELD | Attending: Internal Medicine | Admitting: Internal Medicine

## 2018-05-08 ENCOUNTER — Encounter (HOSPITAL_COMMUNITY): Payer: Self-pay | Admitting: Emergency Medicine

## 2018-05-08 DIAGNOSIS — R5383 Other fatigue: Secondary | ICD-10-CM | POA: Diagnosis not present

## 2018-05-08 DIAGNOSIS — R609 Edema, unspecified: Secondary | ICD-10-CM

## 2018-05-08 DIAGNOSIS — M791 Myalgia, unspecified site: Secondary | ICD-10-CM

## 2018-05-08 DIAGNOSIS — R635 Abnormal weight gain: Secondary | ICD-10-CM | POA: Diagnosis not present

## 2018-05-08 DIAGNOSIS — R2243 Localized swelling, mass and lump, lower limb, bilateral: Secondary | ICD-10-CM | POA: Diagnosis not present

## 2018-05-08 LAB — POCT URINALYSIS DIP (DEVICE)
Bilirubin Urine: NEGATIVE
Glucose, UA: NEGATIVE mg/dL
HGB URINE DIPSTICK: NEGATIVE
Ketones, ur: NEGATIVE mg/dL
LEUKOCYTES UA: NEGATIVE
Nitrite: NEGATIVE
PH: 7.5 (ref 5.0–8.0)
Protein, ur: NEGATIVE mg/dL
SPECIFIC GRAVITY, URINE: 1.015 (ref 1.005–1.030)
UROBILINOGEN UA: 0.2 mg/dL (ref 0.0–1.0)

## 2018-05-08 LAB — POCT I-STAT, CHEM 8
BUN: 5 mg/dL — AB (ref 6–20)
CHLORIDE: 101 mmol/L (ref 98–111)
CREATININE: 0.7 mg/dL (ref 0.44–1.00)
Calcium, Ion: 1.17 mmol/L (ref 1.15–1.40)
Glucose, Bld: 101 mg/dL — ABNORMAL HIGH (ref 70–99)
HCT: 35 % — ABNORMAL LOW (ref 36.0–46.0)
Hemoglobin: 11.9 g/dL — ABNORMAL LOW (ref 12.0–15.0)
Potassium: 4.1 mmol/L (ref 3.5–5.1)
SODIUM: 137 mmol/L (ref 135–145)
TCO2: 26 mmol/L (ref 22–32)

## 2018-05-08 MED ORDER — FUROSEMIDE 20 MG PO TABS: 20.0000 mg | ORAL_TABLET | Freq: Every day | ORAL | 0 refills | Status: DC

## 2018-05-08 NOTE — ED Triage Notes (Signed)
Pt states 3-4 days ago her legs started swelling, states shes gained 10 pounds in 3 days. Pt c/o joint pain all over.

## 2018-05-08 NOTE — Discharge Instructions (Addendum)
Your urine is normal today.  Ace wraps or compression stockings and elevation of the legs.  Drink plenty of water.  3 days of lasix, a diuretic.  Please make appointment to follow up with your primary car doctor as soon as possible, Friday or Monday if able, for recheck.  If develop increasing symptoms without improvement with medications provided, chest pain , shortness of breath , dizziness, increased pain, inability to urinate, redness or otherwise worsening please go to the Er.

## 2018-05-08 NOTE — ED Provider Notes (Signed)
[place]    CSN: [id number] Arrival date & time: 05/08/18  1727     History   Chief Complaint Chief Complaint  Patient presents with  . Leg Swelling  . Generalized Body Aches    HPI Danielle Harrington is a 42 y.o. female.   Danielle Harrington presents with complaints of bilateral lower leg swelling which started approximately 3 days ago. She states she has had a 9lb weight gain in that time due to swelling. Causing pain to lower legs due to this as well as muscle spasms. She has had increased fatigue. No chest pain, palpitations, cough, shortness of breath , dizziness, pain with urination, blood in urine, or nausea, vomiting. Has tried to keep her legs somewhat elevated which has not seemed to help. States can have left leg swelling at times related to her complex regional pain syndrome. She follows with pain management for this. Has been drinking fluids. No hx of DM. No known kidney disease. Hx of add, anxiety, arthritis, depression, migraines, htn, insomnia.    ROS per HPI.      Past Medical History:  Diagnosis Date  . ADD (attention deficit disorder)   . Anxiety   . Arthritis    R knee, sciata treated - injection- 2012  . Complication of anesthesia    used scop. patch in the past  . Depression   . Family history of adverse reaction to anesthesia    N&V  . Headache    migraine, last one 3-4 months ago   . History of anemia   . History of bronchitis   . History of kidney stones   . Hypertension   . Insomnia    Ambien  . PONV (postoperative nausea and vomiting)     Patient Active Problem List   Diagnosis Date Noted  . Complex regional pain syndrome type 1 of left lower extremity 10/09/2016  . Morton neuroma, left     Past Surgical History:  Procedure Laterality Date  . APPENDECTOMY    . CESAREAN SECTION    . EXCISION MORTON'S NEUROMA Left 09/29/2016   Procedure: EXCISION MORTON'S NEUROMA LEFT FOOT 3RD WEB SPACE;  Surgeon: Newt Minion, MD;  Location: [facility];  Service: Orthopedics;  Laterality: Left;  . KNEE SURGERY Right 1995 & 2009   ACL repair & arthroscopy -2009  . LAPAROSCOPIC ENDOMETRIOSIS FULGURATION    . OPEN REDUCTION INTERNAL FIXATION (ORIF) FOOT LISFRANC FRACTURE Left 02/18/2016   Procedure: OPEN REDUCTION INTERNAL FIXATION (ORIF) FOOT LISFRANC FRACTURE;  Surgeon: Newt Minion, MD;  Location: [facility];  Service: Orthopedics;  Laterality: Left;  . TONSILLECTOMY    . ULNAR NERVE REPAIR    . WRIST SURGERY Right    torn cartilage     OB History   None      Home Medications    Prior to Admission medications   Medication Sig Start Date End Date Taking? Authorizing Provider  LOSARTAN POTASSIUM PO Take 100 mg by mouth.   Yes [provider]  ALPRAZolam (XANAX) 0.5 MG tablet Take 1 tablet (0.5 mg total) by mouth 3 (three) times daily as needed for anxiety. 02/20/17   Newt Minion, MD  ALPRAZolam Duanne Moron) 0.5 MG tablet 1/2 -1 TABLET ONCE A DAY AS NEEDED WITH MONTHLY LOCKOUT BY MOUTH (30 DAY SUPPLY) 02/21/17   Newt Minion, MD  buPROPion (WELLBUTRIN XL) 300 MG 24 hr tablet Take 300 mg by mouth daily before breakfast.  02/01/15   [provider]  celecoxib (CELEBREX) 200 MG capsule  07/23/16   [provider]  citalopram (CELEXA) 20 MG tablet Take 20 mg by mouth at bedtime.  01/13/15   [provider]  folic acid (FOLVITE) 1 MG tablet Take 1 mg by mouth daily. 06/08/16   [provider]  gabapentin (NEURONTIN) 300 MG capsule Take 1 capsule (300 mg total) by mouth 3 (three) times daily. 08/31/16   Newt Minion, MD  gabapentin (NEURONTIN) 300 MG capsule Take 2 capsules (600 mg total) by mouth 3 (three) times daily. 3 times a day when necessary neuropathy pain 11/11/16   Newt Minion, MD  ibuprofen (ADVIL,MOTRIN) 200 MG tablet Take 400 mg by mouth every 6 (six) hours as needed for moderate pain (pain).    [provider]  levocetirizine (XYZAL) 5 MG tablet Take 5 mg by mouth every evening.  01/08/15   [provider]  losartan-hydrochlorothiazide (HYZAAR) 100-12.5 MG per tablet Take 1 tablet by mouth daily. 01/08/15   [provider]  methadone (DOLOPHINE) 5 MG tablet Take 5 mg by mouth every 8 (eight) hours.    [provider]  Multiple Vitamin (MULTIVITAMIN) tablet Take 1 tablet by mouth daily.    [provider]  nortriptyline (PAMELOR) 25 MG capsule Take 25 mg by mouth at bedtime.    [provider]  YASMIN 28 3-0.03 MG tablet Take 1 tablet by mouth daily.  01/05/15   [provider]  zolpidem (AMBIEN) 10 MG tablet Take 5-10 mg by mouth at bedtime. SLEEP DEPENDS ON DIFFICULTY SLEEPING 01/11/15   [provider]    Family History No family history on file.  Social History Social History   Tobacco Use  . Smoking status: Former Smoker    Packs/day: 0.50  . Smokeless tobacco: Never Used  . Tobacco comment: Sts she recently quit (08/30/16)  Substance Use Topics  . Alcohol use: Yes    Comment: socially- 1-2 drinks per week   . Drug use: No     Allergies   Adhesive [tape]   Review of Systems Review of Systems   Physical Exam Triage Vital Signs ED Triage Vitals [05/08/18 1757]  Enc Vitals Group     BP 127/74     Pulse Rate (!) 103     Resp 18     Temp 98.8 F (37.1 C)     Temp src      SpO2 99 %     Weight      Height      Head Circumference      Peak Flow      Pain Score      Pain Loc      Pain Edu?      Excl. in [place]?    No data found.  Updated Vital Signs BP 127/74   Pulse (!) 103   Temp 98.8 F (37.1 C)   Resp 18   Wt 194 lb 8 oz (88.2 kg)   SpO2 99%   BMI 33.39 kg/m    Physical Exam  Constitutional: She is oriented to person, place, and time. She appears well-developed and well-nourished. No distress.  HENT:  Head: Normocephalic and atraumatic.  Eyes: Pupils are equal, round, and reactive to light.  Cardiovascular: Regular rhythm and normal heart sounds.  Pulmonary/Chest:  Effort normal and breath sounds normal. No respiratory distress. She has no wheezes. She exhibits no tenderness.  Musculoskeletal:  Feet, ankles and calves bilaterally with +2 edema,  non pitting; extends up to approximately knees; pain to left foot with any touch, patient states baseline; ambulatory but with generalized pain; equal swelling, no redness; sensation intact; cap refill < 2 seconds ; strong pedal pulses; ROM only limited mildly by swelling and left foot limited by baseline pain   Neurological: She is alert and oriented to person, place, and time.  Skin: Skin is warm and dry.  Vitals reviewed.    UC Treatments / Results  Labs (all labs ordered are listed, but only abnormal results are displayed) Labs Reviewed  POCT I-STAT, CHEM 8 - Abnormal; Notable for the following components:      Result Value   BUN 5 (*)    Glucose, Bld 101 (*)    Hemoglobin 11.9 (*)    HCT 35.0 (*)    All other components within normal limits  POCT URINALYSIS DIP (DEVICE)    EKG None  Radiology No results found.  Procedures Procedures (including critical care time)  Medications Ordered in UC Medications - No data to display  Initial Impression / Assessment and Plan / UC Course  I have reviewed the triage vital signs and the nursing notes.  Pertinent labs & imaging results that were available during my care of the patient were reviewed by me and considered in my medical decision making (see chart for details).     Chem 8 appears at baseline for patient, no change in creatinine. Normal urine. No chest pain , shortness of breath , cough or congestion. Lungs clear. In no apparent distress. Swelling is bilateral without evidence of DVT. Elevation, compression, three days of furosemide provided. Encouraged close follow up with PCP for recheck in the next 2-3 days with very strict return precautions as this is a new problem for this patient. Patient verbalized understanding and agreeable to plan.   Ambulatory out of clinic.   Final Clinical Impressions(s) / UC Diagnoses   Final diagnoses:  Peripheral edema     Discharge Instructions     Ace wraps or compression stockings and elevation of the legs.  Drink plenty of water.  3 days of lasix, a diuretic.  Please make appointment to follow up with your primary car doctor as soon as possible, Friday or Monday if able, for recheck.  If develop increasing symptoms without improvement with medications provided, chest pain , shortness of breath , dizziness, increased pain, inability to urinate, redness or otherwise worsening please go to the Er.     ED Prescriptions    None     Controlled Substance Prescriptions [place] Controlled Substance Registry consulted? Not Applicable   Zigmund Gottron, NP 05/08/18 1916

## 2018-05-10 ENCOUNTER — Encounter (HOSPITAL_COMMUNITY): Payer: Self-pay | Admitting: *Deleted

## 2018-05-10 ENCOUNTER — Emergency Department (HOSPITAL_COMMUNITY): Payer: BLUE CROSS/BLUE SHIELD

## 2018-05-10 ENCOUNTER — Emergency Department (HOSPITAL_COMMUNITY)
Admission: EM | Admit: 2018-05-10 | Discharge: 2018-05-10 | Disposition: A | Discharge status: Home / Self Care | Payer: BLUE CROSS/BLUE SHIELD | Attending: Emergency Medicine | Admitting: Emergency Medicine

## 2018-05-10 ENCOUNTER — Other Ambulatory Visit: Payer: Self-pay

## 2018-05-10 DIAGNOSIS — Z87891 Personal history of nicotine dependence: Secondary | ICD-10-CM | POA: Diagnosis not present

## 2018-05-10 DIAGNOSIS — I10 Essential (primary) hypertension: Secondary | ICD-10-CM | POA: Diagnosis not present

## 2018-05-10 DIAGNOSIS — J189 Pneumonia, unspecified organism: Secondary | ICD-10-CM | POA: Diagnosis not present

## 2018-05-10 DIAGNOSIS — Z79899 Other long term (current) drug therapy: Secondary | ICD-10-CM | POA: Diagnosis not present

## 2018-05-10 DIAGNOSIS — R600 Localized edema: Secondary | ICD-10-CM | POA: Diagnosis not present

## 2018-05-10 DIAGNOSIS — R7989 Other specified abnormal findings of blood chemistry: Secondary | ICD-10-CM | POA: Diagnosis not present

## 2018-05-10 DIAGNOSIS — R609 Edema, unspecified: Secondary | ICD-10-CM

## 2018-05-10 DIAGNOSIS — G894 Chronic pain syndrome: Secondary | ICD-10-CM | POA: Diagnosis not present

## 2018-05-10 DIAGNOSIS — G90522 Complex regional pain syndrome I of left lower limb: Secondary | ICD-10-CM | POA: Diagnosis not present

## 2018-05-10 DIAGNOSIS — Z209 Contact with and (suspected) exposure to unspecified communicable disease: Secondary | ICD-10-CM | POA: Diagnosis not present

## 2018-05-10 DIAGNOSIS — R799 Abnormal finding of blood chemistry, unspecified: Secondary | ICD-10-CM | POA: Diagnosis not present

## 2018-05-10 DIAGNOSIS — M549 Dorsalgia, unspecified: Secondary | ICD-10-CM | POA: Diagnosis not present

## 2018-05-10 LAB — I-STAT BETA HCG BLOOD, ED (MC, WL, AP ONLY)

## 2018-05-10 LAB — CBC WITH DIFFERENTIAL/PLATELET
Abs Immature Granulocytes: 0 10*3/uL (ref 0.0–0.1)
Basophils Absolute: 0.1 10*3/uL (ref 0.0–0.1)
Basophils Relative: 1 %
EOS PCT: 4 %
Eosinophils Absolute: 0.2 10*3/uL (ref 0.0–0.7)
HEMATOCRIT: 37.6 % (ref 36.0–46.0)
Hemoglobin: 12 g/dL (ref 12.0–15.0)
Immature Granulocytes: 0 %
LYMPHS ABS: 1.9 10*3/uL (ref 0.7–4.0)
Lymphocytes Relative: 34 %
MCH: 30.7 pg (ref 26.0–34.0)
MCHC: 31.9 g/dL (ref 30.0–36.0)
MCV: 96.2 fL (ref 78.0–100.0)
MONOS PCT: 8 %
Monocytes Absolute: 0.5 10*3/uL (ref 0.1–1.0)
Neutro Abs: 3 10*3/uL (ref 1.7–7.7)
Neutrophils Relative %: 53 %
Platelets: 290 10*3/uL (ref 150–400)
RBC: 3.91 MIL/uL (ref 3.87–5.11)
RDW: 12.8 % (ref 11.5–15.5)
WBC: 5.7 10*3/uL (ref 4.0–10.5)

## 2018-05-10 LAB — BASIC METABOLIC PANEL
Anion gap: 8 (ref 5–15)
BUN: 9 mg/dL (ref 6–20)
CALCIUM: 9 mg/dL (ref 8.9–10.3)
CHLORIDE: 101 mmol/L (ref 98–111)
CO2: 28 mmol/L (ref 22–32)
CREATININE: 0.69 mg/dL (ref 0.44–1.00)
GFR calc Af Amer: >60 mL/min (ref >60)
GFR calc non Af Amer: >60 mL/min (ref >60)
Glucose, Bld: 112 mg/dL — ABNORMAL HIGH (ref 70–99)
Potassium: 3.9 mmol/L (ref 3.5–5.1)
SODIUM: 137 mmol/L (ref 135–145)

## 2018-05-10 LAB — I-STAT TROPONIN, ED: Troponin i, poc: 0 ng/mL (ref 0.00–0.08)

## 2018-05-10 MED ORDER — IOPAMIDOL (ISOVUE-370) INJECTION 76%
INTRAVENOUS | Status: AC
  Filled 2018-05-10: qty 100

## 2018-05-10 MED ORDER — DOXYCYCLINE HYCLATE 100 MG PO CAPS: 100.0000 mg | ORAL_CAPSULE | Freq: Two times a day (BID) | ORAL | 0 refills | Status: AC

## 2018-05-10 MED ORDER — DOXYCYCLINE HYCLATE 100 MG PO TABS
100.0000 mg | ORAL_TABLET | Freq: Once | ORAL | Status: AC
  Administered 2018-05-10: 100 mg via ORAL
  Filled 2018-05-10: qty 1

## 2018-05-10 MED ORDER — IOPAMIDOL (ISOVUE-370) INJECTION 76%
100.0000 mL | Freq: Once | INTRAVENOUS | Status: AC | PRN
  Administered 2018-05-10: 100 mL via INTRAVENOUS

## 2018-05-10 NOTE — ED Notes (Signed)
Ambulated pt while on pulse oximetry. Before walking O2 stat was at 97%. While ambulating, O2 stat fluctuated from 97% to 99% and dropped to 95% briefly.

## 2018-05-10 NOTE — ED Notes (Signed)
REPAGED [facility] for call from MD

## 2018-05-10 NOTE — ED Provider Notes (Signed)
MSE was initiated and I personally evaluated the patient and placed orders (if any) at  6:37 PM on May 10, 2018.  The patient appears stable so that the remainder of the MSE may be completed by another provider.  Patient placed in Quick Look pathway, seen and evaluated   Chief Complaint: Elevated d-dimer  HPI:   Patient presents to ED from PCPs office.  She was told an hour and a half ago that she had an elevated d-dimer and needs to come to the ED to evaluate for a blood clot.  She reports generalized chest pressure for the past several days.  Also feels that her bilateral lower extremities are more swollen than usual.  Patient does endorse OCP use.  Denies any prior history of DVT or PE.  Denies any shortness of breath, hemoptysis.  ROS: Chest pressure  Physical Exam:   Gen: No distress  Neuro: Awake and Alert  Skin: Warm    Focused Exam: Bilateral lower extremity, nonpitting edema.  No calf tenderness bilaterally. RRR. Lungs CTAB.    Initiation of care has begun. The patient has been counseled on the process, plan, and necessity for staying for the completion/evaluation, and the remainder of the medical screening examination    Delia Heady, PA-C 05/10/18 1953    Tegeler, Gwenyth Allegra, MD 05/11/18 [id number]

## 2018-05-10 NOTE — Discharge Instructions (Signed)
Your work-up today did not show evidence of blood clot but did reveal evidence of pneumonia.  In the setting of your improving lower extremity edema, please continue your fluid pills.  Please follow-up with your primary care physician in the next several days.  If any symptoms change or worsen, please return to the nearest emergency department.

## 2018-05-10 NOTE — ED Provider Notes (Signed)
[facility] Provider Note   CSN: [id number] Arrival date & time: 05/10/18  1821     History   Chief Complaint Chief Complaint  Patient presents with  . Abnormal Lab    HPI Danielle Harrington is a 42 y.o. female.  The history is provided by the patient and medical records. No language interpreter was used.  Illness  This is a new problem. The current episode started more than 1 week ago. The problem occurs constantly. The problem has been rapidly improving. Pertinent negatives include no chest pain, no abdominal pain, no headaches and no shortness of breath. Nothing aggravates the symptoms. Nothing relieves the symptoms. She has tried nothing for the symptoms. The treatment provided no relief.    Past Medical History:  Diagnosis Date  . ADD (attention deficit disorder)   . Anxiety   . Arthritis    R knee, sciata treated - injection- 2012  . Complication of anesthesia    used scop. patch in the past  . Depression   . Family history of adverse reaction to anesthesia    N&V  . Headache    migraine, last one 3-4 months ago   . History of anemia   . History of bronchitis   . History of kidney stones   . Hypertension   . Insomnia    Ambien  . PONV (postoperative nausea and vomiting)     Patient Active Problem List   Diagnosis Date Noted  . Complex regional pain syndrome type 1 of left lower extremity 10/09/2016  . Morton neuroma, left     Past Surgical History:  Procedure Laterality Date  . APPENDECTOMY    . CESAREAN SECTION    . EXCISION MORTON'S NEUROMA Left 09/29/2016   Procedure: EXCISION MORTON'S NEUROMA LEFT FOOT 3RD WEB SPACE;  Surgeon: Newt Minion, MD;  Location: [facility];  Service: Orthopedics;  Laterality: Left;  . KNEE SURGERY Right 1995 & 2009   ACL repair & arthroscopy -2009  . LAPAROSCOPIC ENDOMETRIOSIS FULGURATION    . OPEN REDUCTION INTERNAL FIXATION (ORIF) FOOT LISFRANC FRACTURE Left 02/18/2016   Procedure: OPEN  REDUCTION INTERNAL FIXATION (ORIF) FOOT LISFRANC FRACTURE;  Surgeon: Newt Minion, MD;  Location: [facility];  Service: Orthopedics;  Laterality: Left;  . TONSILLECTOMY    . ULNAR NERVE REPAIR    . WRIST SURGERY Right    torn cartilage      OB History   None      Home Medications    Prior to Admission medications   Medication Sig Start Date End Date Taking? Authorizing Provider  ALPRAZolam Duanne Moron) 0.5 MG tablet Take 1 tablet (0.5 mg total) by mouth 3 (three) times daily as needed for anxiety. 02/20/17  Yes Newt Minion, MD  buPROPion (WELLBUTRIN XL) 150 MG 24 hr tablet Take 150 mg by mouth daily at 12 noon.    Yes [provider]  buPROPion (WELLBUTRIN XL) 300 MG 24 hr tablet Take 300 mg by mouth daily before breakfast.  02/01/15  Yes [provider]  citalopram (CELEXA) 20 MG tablet Take 20 mg by mouth at bedtime.  01/13/15  Yes [provider]  folic acid (FOLVITE) 1 MG tablet Take 1 mg by mouth daily. 06/08/16  Yes [provider]  furosemide (LASIX) 20 MG tablet Take 1 tablet (20 mg total) by mouth daily for 3 days. 05/08/18 05/11/18 Yes Burky, Lanelle Bal B, NP  ibuprofen (ADVIL,MOTRIN) 200 MG tablet Take 400 mg  by mouth every 6 (six) hours as needed for moderate pain (pain).   Yes [provider]  levocetirizine (XYZAL) 5 MG tablet Take 5 mg by mouth every evening. 01/08/15  Yes [provider]  losartan (COZAAR) 100 MG tablet Take 100 mg by mouth daily. 03/25/18  Yes [provider]  LYRICA 200 MG capsule Take 200 mg by mouth 2 (two) times daily. 04/17/18  Yes [provider]  methadone (DOLOPHINE) 5 MG tablet Take 5 mg by mouth 4 (four) times daily.    Yes [provider]  Multiple Vitamin (MULTIVITAMIN) tablet Take 1 tablet by mouth daily.   Yes [provider]  nortriptyline (PAMELOR) 25 MG capsule Take 25 mg by mouth at bedtime.   Yes [provider]  YASMIN 28 3-0.03 MG tablet Take 1 tablet by  mouth daily.  01/05/15  Yes [provider]  zolpidem (AMBIEN) 10 MG tablet Take 5-10 mg by mouth at bedtime as needed for sleep.  01/11/15  Yes [provider]  ALPRAZolam (XANAX) 0.5 MG tablet 1/2 -1 TABLET ONCE A DAY AS NEEDED WITH MONTHLY LOCKOUT BY MOUTH (30 DAY SUPPLY) Patient not taking: Reported on 05/10/2018 02/21/17   Newt Minion, MD  gabapentin (NEURONTIN) 300 MG capsule Take 1 capsule (300 mg total) by mouth 3 (three) times daily. Patient not taking: Reported on 05/10/2018 08/31/16   Newt Minion, MD  gabapentin (NEURONTIN) 300 MG capsule Take 2 capsules (600 mg total) by mouth 3 (three) times daily. 3 times a day when necessary neuropathy pain Patient not taking: Reported on 05/10/2018 11/11/16   Newt Minion, MD    Family History No family history on file.  Social History Social History   Tobacco Use  . Smoking status: Former Smoker    Packs/day: 0.50  . Smokeless tobacco: Never Used  . Tobacco comment: Sts she recently quit (08/30/16)  Substance Use Topics  . Alcohol use: Yes    Comment: socially- 1-2 drinks per week   . Drug use: No     Allergies   Adhesive [tape]   Review of Systems Review of Systems  Constitutional: Positive for fatigue. Negative for chills, diaphoresis and fever.  HENT: Negative for congestion.   Eyes: Negative for visual disturbance.  Respiratory: Negative for cough, chest tightness, shortness of breath and wheezing.   Cardiovascular: Positive for leg swelling. Negative for chest pain and palpitations.  Gastrointestinal: Negative for abdominal pain, constipation, diarrhea, nausea and vomiting.  Genitourinary: Negative for dysuria and flank pain.  Musculoskeletal: Negative for back pain, neck pain and neck stiffness.  Skin: Negative for rash and wound.  Neurological: Negative for light-headedness and headaches.  Psychiatric/Behavioral: Negative for agitation.  All other systems reviewed and are negative.    Physical  Exam Updated Vital Signs BP 105/78   Pulse 88   Temp 98.9 F (37.2 C)   Resp 18   Ht 5' 4"  (1.626 m)   Wt 88 kg (194 lb)   SpO2 98%   BMI 33.30 kg/m   Physical Exam  Constitutional: She appears well-developed and well-nourished. No distress.  HENT:  Head: Normocephalic and atraumatic.  Mouth/Throat: Oropharynx is clear and moist. No oropharyngeal exudate.  Eyes: Conjunctivae are normal.  Neck: Normal range of motion. Neck supple.  Cardiovascular: Normal rate and regular rhythm.  No murmur heard. Pulmonary/Chest: Effort normal and breath sounds normal. No respiratory distress. She has no wheezes. She has no rales. She exhibits no tenderness.  Abdominal:  Soft. There is no tenderness. There is no rebound.  Musculoskeletal: She exhibits edema and tenderness.  Bilateral lower extremity edema.  Tenderness of the left lower extremity is at baseline per patient report.  Neurological: She is alert. No sensory deficit. She exhibits normal muscle tone.  Skin: Skin is warm and dry. Capillary refill takes less than 2 seconds. No rash noted. She is not diaphoretic. No erythema.  Psychiatric: She has a normal mood and affect.  Nursing note and vitals reviewed.    ED Treatments / Results  Labs (all labs ordered are listed, but only abnormal results are displayed) Labs Reviewed  BASIC METABOLIC PANEL - Abnormal; Notable for the following components:      Result Value   Glucose, Bld 112 (*)    All other components within normal limits  CBC WITH DIFFERENTIAL/PLATELET  I-STAT TROPONIN, ED  I-STAT BETA HCG BLOOD, ED ([facility], [facility], [facility] ONLY)    EKG EKG Interpretation  Date/Time:  Friday May 10 2018 18:48:44 EDT Ventricular Rate:  92 PR Interval:  108 QRS Duration: 74 QT Interval:  344 QTC Calculation: 425 R Axis:   72 Text Interpretation:  Sinus rhythm with short PR Otherwise normal ECG when compared to prior, no significant changeds.  No STEMI Confirmed by Antony Blackbird [phone]) on  05/10/2018 10:08:37 PM   Radiology Ct Angio Chest Pe W/cm &/or Wo Cm  Result Date: 05/10/2018 CLINICAL DATA:  Bilateral leg swelling.  Elevated D-dimer. EXAM: CT ANGIOGRAPHY CHEST WITH CONTRAST TECHNIQUE: Multidetector CT imaging of the chest was performed using the standard protocol during bolus administration of intravenous contrast. Multiplanar CT image reconstructions and MIPs were obtained to evaluate the vascular anatomy. CONTRAST:  157m ISOVUE-370 IOPAMIDOL (ISOVUE-370) INJECTION 76% COMPARISON:  Chest radiographs dated 08/30/2016. FINDINGS: Cardiovascular: Satisfactory opacification of the pulmonary arteries to the segmental level. No evidence of pulmonary embolism. Normal heart size. No pericardial effusion. Mediastinum/Nodes: No enlarged mediastinal, hilar, or axillary lymph nodes. There are minimally prominent supraclavicular lymph nodes on the left at the thoracic inlet. The largest has a short axis diameter of 8 mm on coronal image number 73 series 8. Thyroid gland, trachea, and esophagus demonstrate no significant findings. Lungs/Pleura: 8 mm mean diameter oval ground-glass nodule in the superior segment of the left lower lobe on image number 62 series 6. There is also a slightly nodular area of ground-glass opacity with a mean diameter of 15 mm on image number 49 of series 6. Additional mild patchy ground-glass opacity elsewhere in both lungs with no additional nodular opacities. No pleural fluid. Upper Abdomen: Unremarkable. Musculoskeletal: Minimal thoracic spine degenerative changes. Review of the MIP images confirms the above findings. IMPRESSION: 1. No pulmonary emboli seen. 2. 15 mm ground-glass nodule in the left upper lobe and 8 mm ground-glass nodule in the left lower lobe. These are most likely infectious or inflammatory in nature. Malignancy is significantly less likely but not excluded. Non-contrast chest CT at 3-6 months is recommended. If the nodules are stable at time of repeat  CT, then future CT at 18-24 months (from today's scan) is considered optional for low-risk patients, but is recommended for high-risk patients. This recommendation follows the consensus statement: Guidelines for Management of Incidental Pulmonary Nodules Detected on CT Images: From the Fleischner Society 2017; Radiology 2017; 284:228-243. 3. Additional minimal patchy ground-glass opacity elsewhere in both lungs. This could be infectious, inflammatory or represent minimal interstitial pulmonary edema. 4. Minimally prominent supraclavicular nodes at the thoracic inlet on the left, most likely  reactive. Electronically Signed   By: Claudie Revering M.D.   On: 05/10/2018 20:53    Procedures Procedures (including critical care time)  Medications Ordered in ED Medications  iopamidol (ISOVUE-370) 76 % injection (has no administration in time range)  doxycycline (VIBRA-TABS) tablet 100 mg (has no administration in time range)  iopamidol (ISOVUE-370) 76 % injection 100 mL (100 mLs Intravenous Contrast Given 05/10/18 2023)     Initial Impression / Assessment and Plan / ED Course  I have reviewed the triage vital signs and the nursing notes.  Pertinent labs & imaging results that were available during my care of the patient were reviewed by me and considered in my medical decision making (see chart for details).     NIYONNA BETSILL is a 42 y.o. female with a past medical history significant for complex regional pain syndrome, hypertension, anemia, arthritis, and depression who presents at the direction of her PCP for elevated d-dimer.  Patient reports that for the last week she has had peripheral edema which is worse than normal.  She says that she was started on Lasix and has been having improvement in her bilateral lower extremity edema however she had screening blood work done showing an elevated d-dimer.  Patient was told to come to the emergency department to rule out pulmonary embolism in the setting of  her fatigue and edema.  Patient denies any chest pain, palpitations, or shortness of breath.  She denies any fevers, chills, congestion, or cough.  She does report sick contacts at work that have been coughing constantly.  She denies any urinary symptoms or GI symptoms.  On exam, lungs are clear.  Chest is nontender.  Abdomen is nontender.  Patient has bilateral pitting edema in her lower extremities which she reports is improving.  She has tenderness to the left leg which is at her baseline for her complex regional pain syndrome.  Patient had symmetric pulses in upper and lower extremities.    EKG revealed no significant changes or STEMI.     given the elevated d-dimer, a PE study was ordered.  CT scan showed no evidence of thromboembolism but did reveal evidence of pneumonia.  Given her exposure to sick contact with coughing, patient will be given a dose of doxycycline and discharged home with antibiotics.  Patient will follow-up with her PCP.     We considered lower extremity DVT ultrasound however is not available at this time.  Shared decision-making conversation was held and Patient was offered outpatient ultrasound tomorrow to further rule out DVT however with her edema improving with the Lasix, the bilateral nature of the swelling, and the d-dimer likely be explained by the pneumonia, we have a lower suspicion that she needs to be started on empiric blood thinners until ultrasound.  Given the patient's reassuring ambulation with pulse ox and her other work-up, patient was felt stable for discharge home.  We also discussed TSH and she reports that her TSH was checked at her PCPs office and it was normal.  Patient understands the possibility of missing a lower extreme any DVT at this time however she would rather follow-up with her PCP for further management and work-up if symptoms worsen.  Patient understood return precautions and plan of care and was discharged in good condition.   Final  Clinical Impressions(s) / ED Diagnoses   Final diagnoses:  Edema, unspecified type  Community acquired pneumonia, unspecified laterality    ED Discharge Orders  Ordered    doxycycline (VIBRAMYCIN) 100 MG capsule  2 times daily     05/10/18 2336     Clinical Impression: 1. Edema, unspecified type   2. Community acquired pneumonia, unspecified laterality     Disposition: Discharge  Condition: Good  I have discussed the results, Dx and Tx plan with the pt(& family if present). He/she/they expressed understanding and agree(s) with the plan. Discharge instructions discussed at great length. Strict return precautions discussed and pt &/or family have verbalized understanding of the instructions. No further questions at time of discharge.    New Prescriptions   DOXYCYCLINE (VIBRAMYCIN) 100 MG CAPSULE    Take 1 capsule (100 mg total) by mouth 2 (two) times daily for 7 days.    Follow Up: Donald Prose, MD [address] [place] [address] [facility] [address] [id number] mc [place]       Tegeler, Gwenyth Allegra, MD 05/10/18 [phone]

## 2018-05-10 NOTE — ED Triage Notes (Signed)
Both legs swollen since Sunday  She saw her doctor on Wednesday  And was seen today and had lab work drawn  She was called at 14 and told that she had an elevated d-dimer    lmp not sure birth control pills

## 2018-05-10 NOTE — ED Notes (Signed)
Contacted [facility]-in for any info about patient's "abnormal labs."  Dr. Tamala Julian should be returning on-call.

## 2018-05-16 DIAGNOSIS — G90522 Complex regional pain syndrome I of left lower limb: Secondary | ICD-10-CM | POA: Diagnosis not present

## 2018-05-16 DIAGNOSIS — M549 Dorsalgia, unspecified: Secondary | ICD-10-CM | POA: Diagnosis not present

## 2018-05-16 DIAGNOSIS — G894 Chronic pain syndrome: Secondary | ICD-10-CM | POA: Diagnosis not present

## 2018-07-11 DIAGNOSIS — G90522 Complex regional pain syndrome I of left lower limb: Secondary | ICD-10-CM | POA: Diagnosis not present

## 2018-07-11 DIAGNOSIS — M549 Dorsalgia, unspecified: Secondary | ICD-10-CM | POA: Diagnosis not present

## 2018-07-11 DIAGNOSIS — G894 Chronic pain syndrome: Secondary | ICD-10-CM | POA: Diagnosis not present

## 2018-07-16 DIAGNOSIS — Z6832 Body mass index (BMI) 32.0-32.9, adult: Secondary | ICD-10-CM | POA: Diagnosis not present

## 2018-07-16 DIAGNOSIS — Z1159 Encounter for screening for other viral diseases: Secondary | ICD-10-CM | POA: Diagnosis not present

## 2018-07-16 DIAGNOSIS — Z113 Encounter for screening for infections with a predominantly sexual mode of transmission: Secondary | ICD-10-CM | POA: Diagnosis not present

## 2018-07-16 DIAGNOSIS — Z23 Encounter for immunization: Secondary | ICD-10-CM | POA: Diagnosis not present

## 2018-07-16 DIAGNOSIS — Z1231 Encounter for screening mammogram for malignant neoplasm of breast: Secondary | ICD-10-CM | POA: Diagnosis not present

## 2018-07-16 DIAGNOSIS — N809 Endometriosis, unspecified: Secondary | ICD-10-CM | POA: Diagnosis not present

## 2018-07-16 DIAGNOSIS — Z124 Encounter for screening for malignant neoplasm of cervix: Secondary | ICD-10-CM | POA: Diagnosis not present

## 2018-07-16 DIAGNOSIS — Z114 Encounter for screening for human immunodeficiency virus [HIV]: Secondary | ICD-10-CM | POA: Diagnosis not present

## 2018-07-16 DIAGNOSIS — Z01419 Encounter for gynecological examination (general) (routine) without abnormal findings: Secondary | ICD-10-CM | POA: Diagnosis not present

## 2018-07-16 DIAGNOSIS — Z1151 Encounter for screening for human papillomavirus (HPV): Secondary | ICD-10-CM | POA: Diagnosis not present

## 2018-07-16 DIAGNOSIS — Z118 Encounter for screening for other infectious and parasitic diseases: Secondary | ICD-10-CM | POA: Diagnosis not present

## 2018-08-07 DIAGNOSIS — G90522 Complex regional pain syndrome I of left lower limb: Secondary | ICD-10-CM | POA: Diagnosis not present

## 2018-08-07 DIAGNOSIS — G894 Chronic pain syndrome: Secondary | ICD-10-CM | POA: Diagnosis not present

## 2018-08-07 DIAGNOSIS — M549 Dorsalgia, unspecified: Secondary | ICD-10-CM | POA: Diagnosis not present

## 2018-08-20 DIAGNOSIS — R911 Solitary pulmonary nodule: Secondary | ICD-10-CM | POA: Diagnosis not present

## 2018-08-20 DIAGNOSIS — I10 Essential (primary) hypertension: Secondary | ICD-10-CM | POA: Diagnosis not present

## 2018-08-20 DIAGNOSIS — G4700 Insomnia, unspecified: Secondary | ICD-10-CM | POA: Diagnosis not present

## 2018-08-20 DIAGNOSIS — F419 Anxiety disorder, unspecified: Secondary | ICD-10-CM | POA: Diagnosis not present

## 2018-08-21 ENCOUNTER — Other Ambulatory Visit: Payer: Self-pay | Admitting: Family Medicine

## 2018-08-21 DIAGNOSIS — R911 Solitary pulmonary nodule: Secondary | ICD-10-CM

## 2018-08-26 ENCOUNTER — Ambulatory Visit
Admission: RE | Admit: 2018-08-26 | Discharge: 2018-08-26 | Disposition: A | Discharge status: Home / Self Care | Payer: BLUE CROSS/BLUE SHIELD | Source: Ambulatory Visit | Attending: Family Medicine | Admitting: Family Medicine

## 2018-08-26 DIAGNOSIS — R911 Solitary pulmonary nodule: Secondary | ICD-10-CM

## 2018-08-26 DIAGNOSIS — R918 Other nonspecific abnormal finding of lung field: Secondary | ICD-10-CM | POA: Diagnosis not present

## 2018-09-11 DIAGNOSIS — G894 Chronic pain syndrome: Secondary | ICD-10-CM | POA: Diagnosis not present

## 2018-09-12 ENCOUNTER — Institutional Professional Consult (permissible substitution): Payer: BLUE CROSS/BLUE SHIELD | Admitting: Pulmonary Disease

## 2018-09-27 ENCOUNTER — Encounter: Payer: Self-pay | Admitting: Pulmonary Disease

## 2018-09-27 ENCOUNTER — Ambulatory Visit (INDEPENDENT_AMBULATORY_CARE_PROVIDER_SITE_OTHER): Payer: BLUE CROSS/BLUE SHIELD | Admitting: Pulmonary Disease

## 2018-09-27 VITALS — BP 124/76 | HR 96 | Ht 63.78 in | Wt 188.6 lb

## 2018-09-27 DIAGNOSIS — J849 Interstitial pulmonary disease, unspecified: Secondary | ICD-10-CM | POA: Diagnosis not present

## 2018-09-27 NOTE — Progress Notes (Addendum)
Danielle Harrington    [id number]    1976/10/20  Primary Care Physician:Sun, Gari Crown, MD  Referring Physician: Maury Dus, MD [place] [place], [place] [address]  Chief complaint: Consult for abnormal CT  HPI: 42 year old with complex regional pain syndrome, hypertension, anemia, arthritis, depression.  Referred evaluation of abnormal CT scan that shows scattered groundglass opacities She was seen in ED on 05/10/2018 for peripheral edema, elevated d-dimer.  She had a PE protocol scan with no evidence of blood clot.  However it showed diffuse bilateral groundglass opacities and was treated as a pneumonia She had a follow-up CT scan on 11/4 which shows persistent bilateral groundglass opacities which appears somewhat more defined than prior scan.  Reports mild dyspnea with wheezing.  No cough, sputum production, fevers, chills, diarrhea, nausea, constipation. She has complex pain history with CPRS in the lower extremity after lis franc fracture in 01/2016.  Follows with pain clinic at [facility] and is on multiple pain medications and had several ketamine infusions in the past. She has started vaping CBD oil to help with the pain in September 2019.  Pets: 2 cats Occupation: Works in Interior and spatial designer for [employer] Exposures: No mold, hot tub, Jacuzzi. Smoking history: 10-pack-year smoker.  Quit in November 2017.  She has started vaping CBD oil in September 2019. Travel history: No recent significant travel. Relevant family history: Uncle died of lung cancer.  He was a smoker.  Outpatient Encounter Medications as of 09/27/2018  Medication Sig  . ALPRAZolam (XANAX) 0.5 MG tablet 1/2 -1 TABLET ONCE A DAY AS NEEDED WITH MONTHLY LOCKOUT BY MOUTH (30 DAY SUPPLY)  . buPROPion (WELLBUTRIN XL) 150 MG 24 hr tablet Take 150 mg by mouth daily at 12 noon.   Marland Kitchen buPROPion (WELLBUTRIN XL) 300 MG 24 hr tablet Take 300 mg by mouth daily before breakfast.   .  citalopram (CELEXA) 20 MG tablet Take 20 mg by mouth at bedtime.   . folic acid (FOLVITE) 1 MG tablet Take 1 mg by mouth daily.  Marland Kitchen ibuprofen (ADVIL,MOTRIN) 200 MG tablet Take 400 mg by mouth every 6 (six) hours as needed for moderate pain (pain).  Marland Kitchen levocetirizine (XYZAL) 5 MG tablet Take 5 mg by mouth every evening.  Marland Kitchen losartan (COZAAR) 100 MG tablet Take 100 mg by mouth daily.  Marland Kitchen LYRICA 200 MG capsule Take 200 mg by mouth 2 (two) times daily.  . methadone (DOLOPHINE) 5 MG tablet Take 5 mg by mouth 4 (four) times daily.   . Multiple Vitamin (MULTIVITAMIN) tablet Take 1 tablet by mouth daily.  . naltrexone (DEPADE) 50 MG tablet Take 2 mg by mouth daily.  . nortriptyline (PAMELOR) 25 MG capsule Take 25 mg by mouth at bedtime.  Marland Kitchen YASMIN 28 3-0.03 MG tablet Take 1 tablet by mouth daily.   Marland Kitchen zolpidem (AMBIEN) 10 MG tablet Take 5-10 mg by mouth at bedtime as needed for sleep.   . furosemide (LASIX) 20 MG tablet Take 1 tablet (20 mg total) by mouth daily for 3 days.  . [DISCONTINUED] ALPRAZolam (XANAX) 0.5 MG tablet Take 1 tablet (0.5 mg total) by mouth 3 (three) times daily as needed for anxiety.  . [DISCONTINUED] gabapentin (NEURONTIN) 300 MG capsule Take 1 capsule (300 mg total) by mouth 3 (three) times daily. (Patient not taking: Reported on 05/10/2018)  . [DISCONTINUED] gabapentin (NEURONTIN) 300 MG capsule Take 2 capsules (600 mg total) by mouth 3 (three) times daily.  3 times a day when necessary neuropathy pain (Patient not taking: Reported on 05/10/2018)   No facility-administered encounter medications on file as of 09/27/2018.     Allergies as of 09/27/2018 - Review Complete 09/27/2018  Allergen Reaction Noted  . Adhesive [tape] Itching and Rash 02/15/2016    Past Medical History:  Diagnosis Date  . ADD (attention deficit disorder)   . Anxiety   . Arthritis    R knee, sciata treated - injection- 2012  . Complication of anesthesia    used scop. patch in the past  . Depression   .  Endometriosis   . Family history of adverse reaction to anesthesia    N&V  . Headache    migraine, last one 3-4 months ago   . History of anemia   . History of bronchitis   . History of kidney stones   . Hypertension   . Insomnia    Ambien  . PONV (postoperative nausea and vomiting)     Past Surgical History:  Procedure Laterality Date  . APPENDECTOMY    . CESAREAN SECTION    . EXCISION MORTON'S NEUROMA Left 09/29/2016   Procedure: EXCISION MORTON'S NEUROMA LEFT FOOT 3RD WEB SPACE;  Surgeon: Newt Minion, MD;  Location: [facility];  Service: Orthopedics;  Laterality: Left;  . KNEE SURGERY Right 1995 & 2009   ACL repair & arthroscopy -2009  . LAPAROSCOPIC ENDOMETRIOSIS FULGURATION    . OPEN REDUCTION INTERNAL FIXATION (ORIF) FOOT LISFRANC FRACTURE Left 02/18/2016   Procedure: OPEN REDUCTION INTERNAL FIXATION (ORIF) FOOT LISFRANC FRACTURE;  Surgeon: Newt Minion, MD;  Location: [facility];  Service: Orthopedics;  Laterality: Left;  . TONSILLECTOMY    . ULNAR NERVE REPAIR    . WRIST SURGERY Right    torn cartilage     Family History  Problem Relation Age of Onset  . Cancer Mother   . Heart failure Father   . Breast cancer Sister     Social History   Socioeconomic History  . Marital status: Married    Spouse name: Not on file  . Number of children: Not on file  . Years of education: Not on file  . Highest education level: Not on file  Occupational History  . Not on file  Social Needs  . Financial resource strain: Not on file  . Food insecurity:    Worry: Not on file    Inability: Not on file  . Transportation needs:    Medical: Not on file    Non-medical: Not on file  Tobacco Use  . Smoking status: Former Smoker    Packs/day: 0.50  . Smokeless tobacco: Never Used  . Tobacco comment: Sts she recently quit (08/30/16)  Substance and Sexual Activity  . Alcohol use: Yes    Comment: socially- 1-2 drinks per week   . Drug use: No  . Sexual activity: Not on file  Lifestyle    . Physical activity:    Days per week: Not on file    Minutes per session: Not on file  . Stress: Not on file  Relationships  . Social connections:    Talks on phone: Not on file    Gets together: Not on file    Attends religious service: Not on file    Active member of club or organization: Not on file    Attends meetings of clubs or organizations: Not on file    Relationship status: Not on file  . Intimate partner violence:  Fear of current or ex partner: Not on file    Emotionally abused: Not on file    Physically abused: Not on file    Forced sexual activity: Not on file  Other Topics Concern  . Not on file  Social History Narrative  . Not on file   Review of systems: Review of Systems  Constitutional: Negative for fever and chills.  HENT: Negative.   Eyes: Negative for blurred vision.  Respiratory: as per HPI  Cardiovascular: Negative for chest pain and palpitations.  Gastrointestinal: Negative for vomiting, diarrhea, blood per rectum. Genitourinary: Negative for dysuria, urgency, frequency and hematuria.  Musculoskeletal: Negative for myalgias, back pain and joint pain.  Skin: Negative for itching and rash.  Neurological: Negative for dizziness, tremors, focal weakness, seizures and loss of consciousness.  Endo/Heme/Allergies: Negative for environmental allergies.  Psychiatric/Behavioral: Negative for depression, suicidal ideas and hallucinations.  All other systems reviewed and are negative.  Physical Exam: Blood pressure 124/76, pulse 96, height 5' 3.78" (1.62 m), weight 188 lb 9.6 oz (85.5 kg), SpO2 97 %. Gen:      No acute distress HEENT:  EOMI, sclera anicteric Neck:     No masses; no thyromegaly Lungs:    Clear to auscultation bilaterally; normal respiratory effort CV:         Regular rate and rhythm; no murmurs Abd:      + bowel sounds; soft, non-tender; no palpable masses, no distension Ext:    No edema; adequate peripheral perfusion Skin:      Warm  and dry; no rash Neuro: alert and oriented x 3 Psych: normal mood and affect  Data Reviewed: Imaging: CT 05/10/2018- pulmonary embolism, bilateral groundglass opacities, 15 mm groundglass nodule in the left upper lobe, 8 mm groundglass nodule in the left lower lobe. CT 08/26/2018--areas of groundglass attenuation in the mid to upper lung lobes which appear more defined compared to earlier scan I have reviewed the images personally.  Assessment:  Abnormal CT scan CT imaging personally reviewed showing bilateral groundglass patchy opacities.  The appearance is suggestive of atypical infection versus versus noninfectious pneumonia. This may be associated with vape related lung injury although she states that vaping CBD started after her first CT scan in July 2019  I have encouraged her to quit vaping CBD She is concerned about the lung findings and wants to proceed with a bronchoscopy with BAL We will attempt to do transbronchial biopsies if possible.  It may be difficult to sedate her completely given her use of pain medications. Risk benefits discussed with the patient and she agrees to go ahead Procedure scheduled for 12/12.  Plan/Recommendations: - Quit vaping CBD - Bronchoscope on 12/12 - Check CBC differential, PT/INR in preparation for procedure.  More then 1/2 the time of the 40 min visit was spent in counseling and/or coordination of care with the patient and family.  Marshell Garfinkel MD [facility] 09/27/2018, 3:29 PM  CC: Maury Dus, MD

## 2018-09-27 NOTE — Patient Instructions (Addendum)
We have schedule you for bronchoscopy on 12/12 at [facility] will need to arrive at least 1 to 2 hours before procedure to get checked in  We will check CBC differential, PT/INR today in preparation for procedure Follow up in 2 to 4 weeks.

## 2018-09-28 LAB — PROTIME-INR
INR: 0.9
Prothrombin Time: 9.2 s (ref 9.0–11.5)

## 2018-09-28 LAB — CBC WITH DIFFERENTIAL/PLATELET
Basophils Absolute: 58 cells/uL (ref 0–200)
Basophils Relative: 1.1 %
Eosinophils Absolute: 223 cells/uL (ref 15–500)
Eosinophils Relative: 4.2 %
HCT: 35.1 % (ref 35.0–45.0)
Hemoglobin: 11.9 g/dL (ref 11.7–15.5)
Lymphs Abs: 2491 cells/uL (ref 850–3900)
MCH: 29.8 pg (ref 27.0–33.0)
MCHC: 33.9 g/dL (ref 32.0–36.0)
MCV: 88 fL (ref 80.0–100.0)
MPV: 10.8 fL (ref 7.5–12.5)
Monocytes Relative: 7.2 %
NEUTROS PCT: 40.5 %
Neutro Abs: 2147 cells/uL (ref 1500–7800)
PLATELETS: 274 10*3/uL (ref 140–400)
RBC: 3.99 10*6/uL (ref 3.80–5.10)
RDW: 12.3 % (ref 11.0–15.0)
Total Lymphocyte: 47 %
WBC mixed population: 382 cells/uL (ref 200–950)
WBC: 5.3 10*3/uL (ref 3.8–10.8)

## 2018-10-01 ENCOUNTER — Telehealth: Payer: Self-pay | Admitting: Pulmonary Disease

## 2018-10-01 NOTE — Telephone Encounter (Signed)
That is fine we can cancel the scheduled procedure for this week. Will reassess at office visit in [place]

## 2018-10-01 NOTE — Telephone Encounter (Signed)
Called and spoke with patient and let her know Dr. Matilde Bash recs  Will route this message to [facility] to cancel procedure   Nothing further needed at this time, will close message

## 2018-10-01 NOTE — Telephone Encounter (Signed)
Called and spoke with patient. She states she is having the procedure with Dr. Vaughan Browner. She wants to reschedule for cost reasons to push procedure out apprx 3 weeks. Patient wasn't sure of the urgency for procedure.   Dr. Vaughan Browner please advise.

## 2018-10-03 ENCOUNTER — Ambulatory Visit (HOSPITAL_COMMUNITY)
Admission: RE | Admit: 2018-10-03 | Payer: BLUE CROSS/BLUE SHIELD | Source: Ambulatory Visit | Admitting: Pulmonary Disease

## 2018-10-03 ENCOUNTER — Encounter (HOSPITAL_COMMUNITY): Payer: BLUE CROSS/BLUE SHIELD

## 2018-10-03 ENCOUNTER — Encounter (HOSPITAL_COMMUNITY): Admission: RE | Payer: Self-pay | Source: Ambulatory Visit

## 2018-10-03 SURGERY — BRONCHOSCOPY, WITH FLUOROSCOPY
Anesthesia: Moderate Sedation | Laterality: Bilateral

## 2018-10-09 DIAGNOSIS — B977 Papillomavirus as the cause of diseases classified elsewhere: Secondary | ICD-10-CM | POA: Diagnosis not present

## 2018-10-09 DIAGNOSIS — N809 Endometriosis, unspecified: Secondary | ICD-10-CM | POA: Diagnosis not present

## 2018-10-09 DIAGNOSIS — I10 Essential (primary) hypertension: Secondary | ICD-10-CM | POA: Diagnosis not present

## 2018-10-09 DIAGNOSIS — Z3202 Encounter for pregnancy test, result negative: Secondary | ICD-10-CM | POA: Diagnosis not present

## 2018-10-09 DIAGNOSIS — Z30430 Encounter for insertion of intrauterine contraceptive device: Secondary | ICD-10-CM | POA: Diagnosis not present

## 2018-10-09 DIAGNOSIS — N72 Inflammatory disease of cervix uteri: Secondary | ICD-10-CM | POA: Diagnosis not present

## 2018-10-25 DIAGNOSIS — G894 Chronic pain syndrome: Secondary | ICD-10-CM | POA: Diagnosis not present

## 2018-10-25 DIAGNOSIS — M5417 Radiculopathy, lumbosacral region: Secondary | ICD-10-CM | POA: Diagnosis not present

## 2018-10-25 DIAGNOSIS — G90522 Complex regional pain syndrome I of left lower limb: Secondary | ICD-10-CM | POA: Diagnosis not present

## 2018-11-08 DIAGNOSIS — Z30431 Encounter for routine checking of intrauterine contraceptive device: Secondary | ICD-10-CM | POA: Diagnosis not present

## 2018-11-13 ENCOUNTER — Encounter: Payer: Self-pay | Admitting: Pulmonary Disease

## 2018-11-13 ENCOUNTER — Ambulatory Visit: Payer: BLUE CROSS/BLUE SHIELD | Admitting: Pulmonary Disease

## 2018-11-13 DIAGNOSIS — J849 Interstitial pulmonary disease, unspecified: Secondary | ICD-10-CM

## 2018-11-13 NOTE — Patient Instructions (Addendum)
We will get a high-resolution CT around mid February to reevaluate the inflammation in your lung Follow-up in clinic after CT scan for review and plan for next steps.

## 2018-11-13 NOTE — Progress Notes (Signed)
Danielle Harrington    [id number]    1975/11/30  Primary Care Physician:Sun, Gari Crown, MD  Referring Physician: Donald Prose, MD [place] [place], [place] [address]  Chief complaint: Follow-up for abnormal CT  HPI: 43 year old with complex regional pain syndrome, hypertension, anemia, arthritis, depression.  Referred evaluation of abnormal CT scan that shows scattered groundglass opacities She was seen in ED on 05/10/2018 for peripheral edema, elevated d-dimer.  She had a PE protocol scan with no evidence of blood clot.  However it showed diffuse bilateral groundglass opacities and was treated as a pneumonia She had a follow-up CT scan on 11/4 which shows persistent bilateral groundglass opacities which appears somewhat more defined than prior scan.  Reports mild dyspnea with wheezing.  No cough, sputum production, fevers, chills, diarrhea, nausea, constipation. She has complex pain history with CPRS in the lower extremity after lis franc fracture in 01/2016.  Follows with pain clinic at [facility] and is on multiple pain medications and had several ketamine infusions in the past. She has started vaping CBD oil to help with the pain in September 2019.  Pets: 2 cats Occupation: Works in Interior and spatial designer for [employer] Exposures: No mold, hot tub, Jacuzzi. Smoking history: 10-pack-year smoker.  Quit in November 2017.  She has started vaping CBD oil in September 2019. Travel history: No recent significant travel. Relevant family history: Uncle died of lung cancer.  He was a smoker.  Interim history: Bronchoscopy was scheduled for mid December but canceled by patient She is here for follow-up.  States that her breathing is doing very well with no issues She stopped vaping CBD oil  She is due to undergo implantation of spinal nerve stimulator to help with her chronic lower extremity pain.  Outpatient Encounter Medications as of 11/13/2018  Medication Sig   . ALPRAZolam (XANAX) 0.5 MG tablet 1/2 -1 TABLET ONCE A DAY AS NEEDED WITH MONTHLY LOCKOUT BY MOUTH (30 DAY SUPPLY) (Patient taking differently: Take 0.25-0.5 mg by mouth 2 (two) times daily as needed for anxiety. )  . buPROPion (WELLBUTRIN XL) 150 MG 24 hr tablet Take 150 mg by mouth daily at 12 noon.   Marland Kitchen buPROPion (WELLBUTRIN XL) 300 MG 24 hr tablet Take 300 mg by mouth daily before breakfast.   . DULoxetine (CYMBALTA) 60 MG capsule Take 60 mg by mouth at bedtime.  . folic acid (FOLVITE) 1 MG tablet Take 1 mg by mouth at bedtime.   Marland Kitchen ibuprofen (ADVIL,MOTRIN) 200 MG tablet Take 800 mg by mouth 3 (three) times daily as needed for moderate pain (pain).   Marland Kitchen levocetirizine (XYZAL) 5 MG tablet Take 5 mg by mouth at bedtime.   Marland Kitchen losartan (COZAAR) 100 MG tablet Take 100 mg by mouth daily.  . methadone (DOLOPHINE) 5 MG tablet Take 5 mg by mouth 5 (five) times daily.   Marland Kitchen NALTREXONE HCL PO Take 4 mg by mouth at bedtime.   . nortriptyline (PAMELOR) 25 MG capsule Take 25 mg by mouth at bedtime.  . pregabalin (LYRICA) 300 MG capsule Take 300 mg by mouth 2 (two) times daily.  . vitamin C (ASCORBIC ACID) 500 MG tablet Take 1,000 mg by mouth every evening.  . zolpidem (AMBIEN) 10 MG tablet Take 10 mg by mouth at bedtime.   Marland Kitchen nystatin-triamcinolone ointment (MYCOLOG)   . [DISCONTINUED] furosemide (LASIX) 20 MG tablet Take 1 tablet (20 mg total) by mouth daily for 3 days. (Patient not  taking: Reported on 09/30/2018)  . [DISCONTINUED] YASMIN 28 3-0.03 MG tablet Take 1 tablet by mouth daily. Skip placebos, take continuously   No facility-administered encounter medications on file as of 11/13/2018.    Physical Exam: Blood pressure 128/64, pulse 100, height 5' 3.78" (1.62 m), weight 191 lb (86.6 kg), SpO2 96 %. Gen:      No acute distress HEENT:  EOMI, sclera anicteric Neck:     No masses; no thyromegaly Lungs:    Clear to auscultation bilaterally; normal respiratory effort CV:         Regular rate and rhythm;  no murmurs Abd:      + bowel sounds; soft, non-tender; no palpable masses, no distension Ext:    No edema; adequate peripheral perfusion Skin:      Warm and dry; no rash Neuro: alert and oriented x 3 Psych: normal mood and affect  Data Reviewed: Imaging: CT 05/10/2018- pulmonary embolism, bilateral groundglass opacities, 15 mm groundglass nodule in the left upper lobe, 8 mm groundglass nodule in the left lower lobe.  CT 08/26/2018--areas of groundglass attenuation in the mid to upper lung lobes which appear more defined compared to earlier scan I have reviewed the images personally.  Labs: CBC 09/27/2018-WBC 5.3, eos 4.2 %, absolute eosinophil count 223  Assessment:  Abnormal CT scan CT imaging reviewed showing bilateral groundglass patchy opacities.  The appearance is suggestive of atypical infection versus versus noninfectious pneumonia. This may be associated with vape related lung injury although she states that vaping CBD started after her first CT scan in July 2019  Currently not vaping since end of December Hold off on bronchoscope since her breathing is doing much better Get a follow-up CT and reevaluate  Plan/Recommendations: - Follow-up high-resolution CT  Marshell Garfinkel MD [facility] 11/13/2018, 12:23 PM  CC: Donald Prose, MD

## 2018-11-19 DIAGNOSIS — G8929 Other chronic pain: Secondary | ICD-10-CM | POA: Diagnosis not present

## 2018-11-19 DIAGNOSIS — M1990 Unspecified osteoarthritis, unspecified site: Secondary | ICD-10-CM | POA: Diagnosis not present

## 2018-11-19 DIAGNOSIS — F988 Other specified behavioral and emotional disorders with onset usually occurring in childhood and adolescence: Secondary | ICD-10-CM | POA: Diagnosis not present

## 2018-11-19 DIAGNOSIS — M5416 Radiculopathy, lumbar region: Secondary | ICD-10-CM | POA: Diagnosis not present

## 2018-11-19 DIAGNOSIS — G90522 Complex regional pain syndrome I of left lower limb: Secondary | ICD-10-CM | POA: Diagnosis not present

## 2018-11-19 DIAGNOSIS — Z87891 Personal history of nicotine dependence: Secondary | ICD-10-CM | POA: Diagnosis not present

## 2018-11-19 DIAGNOSIS — Z91048 Other nonmedicinal substance allergy status: Secondary | ICD-10-CM | POA: Diagnosis not present

## 2018-11-19 DIAGNOSIS — M545 Low back pain: Secondary | ICD-10-CM | POA: Diagnosis not present

## 2018-11-19 DIAGNOSIS — M961 Postlaminectomy syndrome, not elsewhere classified: Secondary | ICD-10-CM | POA: Diagnosis not present

## 2018-12-03 DIAGNOSIS — Z713 Dietary counseling and surveillance: Secondary | ICD-10-CM | POA: Diagnosis not present

## 2018-12-03 DIAGNOSIS — Z1329 Encounter for screening for other suspected endocrine disorder: Secondary | ICD-10-CM | POA: Diagnosis not present

## 2018-12-03 DIAGNOSIS — Z6833 Body mass index (BMI) 33.0-33.9, adult: Secondary | ICD-10-CM | POA: Diagnosis not present

## 2018-12-06 ENCOUNTER — Other Ambulatory Visit: Payer: BLUE CROSS/BLUE SHIELD

## 2018-12-09 ENCOUNTER — Ambulatory Visit
Admission: RE | Admit: 2018-12-09 | Discharge: 2018-12-09 | Disposition: A | Discharge status: Home / Self Care | Payer: BLUE CROSS/BLUE SHIELD | Source: Ambulatory Visit | Attending: Pulmonary Disease | Admitting: Pulmonary Disease

## 2018-12-09 DIAGNOSIS — J849 Interstitial pulmonary disease, unspecified: Secondary | ICD-10-CM

## 2018-12-09 DIAGNOSIS — J449 Chronic obstructive pulmonary disease, unspecified: Secondary | ICD-10-CM | POA: Diagnosis not present

## 2018-12-10 NOTE — Progress Notes (Signed)
Pt returned call. Informed her of the results and recs per Dr. Vaughan Browner. Pt verbalized understanding and denied any further questions or concerns at this time.

## 2018-12-16 ENCOUNTER — Encounter: Payer: Self-pay | Admitting: Pulmonary Disease

## 2018-12-16 ENCOUNTER — Ambulatory Visit: Payer: BLUE CROSS/BLUE SHIELD | Admitting: Pulmonary Disease

## 2018-12-16 DIAGNOSIS — J849 Interstitial pulmonary disease, unspecified: Secondary | ICD-10-CM | POA: Diagnosis not present

## 2018-12-16 NOTE — Progress Notes (Signed)
Danielle Harrington    [id number]    Jan 30, 1976  Primary Care Physician:Sun, Gari Crown, MD  Referring Physician: Donald Prose, MD [address] [place], [place] [address]  Chief complaint: Follow-up for abnormal CT  HPI: 43 year old with complex regional pain syndrome, hypertension, anemia, arthritis, depression.  Referred evaluation of abnormal CT scan that shows scattered groundglass opacities She was seen in ED on 05/10/2018 for peripheral edema, elevated d-dimer.  She had a PE protocol scan with no evidence of blood clot.  However it showed diffuse bilateral groundglass opacities and was treated as a pneumonia She had a follow-up CT scan on 11/4 which shows persistent bilateral groundglass opacities which appears somewhat more defined than prior scan. Bronchoscopy was scheduled for mid December but canceled by patient  Reports mild dyspnea with wheezing.  No cough, sputum production, fevers, chills, diarrhea, nausea, constipation. She has complex pain history with CPRS in the lower extremity after lis franc fracture in 01/2016.  Follows with pain clinic at [facility] and is on multiple pain medications and had several ketamine infusions in the past. She has started vaping CBD oil to help with the pain in September 2019.  Pets: 2 cats Occupation: Works in Interior and spatial designer for [employer] Exposures: No mold, hot tub, Jacuzzi. Smoking history: 10-pack-year smoker.  Quit in November 2017.  She has started vaping CBD oil in September 2019. Travel history: No recent significant travel. Relevant family history: Uncle died of lung cancer.  He was a smoker.  Interim history: Here for follow-up of CT scan.  States that breathing is doing well with no issues.  She has stopped vaping CBD oil  Underwent implantation of spinal nerve stimulator to help with chronic lower extremity pain.  Outpatient Encounter Medications as of 12/16/2018  Medication Sig  . ALPRAZolam  (XANAX) 0.5 MG tablet 1/2 -1 TABLET ONCE A DAY AS NEEDED WITH MONTHLY LOCKOUT BY MOUTH (30 DAY SUPPLY) (Patient taking differently: Take 0.25-0.5 mg by mouth 2 (two) times daily as needed for anxiety. )  . buPROPion (WELLBUTRIN XL) 150 MG 24 hr tablet Take 150 mg by mouth daily at 12 noon.   Marland Kitchen buPROPion (WELLBUTRIN XL) 300 MG 24 hr tablet Take 300 mg by mouth daily before breakfast.   . DULoxetine (CYMBALTA) 60 MG capsule Take 60 mg by mouth at bedtime.  . folic acid (FOLVITE) 1 MG tablet Take 1 mg by mouth at bedtime.   Marland Kitchen ibuprofen (ADVIL,MOTRIN) 200 MG tablet Take 800 mg by mouth 3 (three) times daily as needed for moderate pain (pain).   Marland Kitchen levocetirizine (XYZAL) 5 MG tablet Take 5 mg by mouth at bedtime.   Marland Kitchen losartan (COZAAR) 100 MG tablet Take 100 mg by mouth daily.  . methadone (DOLOPHINE) 5 MG tablet Take 5 mg by mouth 5 (five) times daily.   Marland Kitchen NALTREXONE HCL PO Take 4 mg by mouth at bedtime.   . nortriptyline (PAMELOR) 25 MG capsule Take 25 mg by mouth at bedtime.  Marland Kitchen nystatin-triamcinolone ointment (MYCOLOG)   . pregabalin (LYRICA) 300 MG capsule Take 300 mg by mouth 2 (two) times daily.  . vitamin C (ASCORBIC ACID) 500 MG tablet Take 1,000 mg by mouth every evening.  . zolpidem (AMBIEN) 10 MG tablet Take 10 mg by mouth at bedtime.    No facility-administered encounter medications on file as of 12/16/2018.    Physical Exam: Blood pressure 108/62, pulse 92, height 5' 4"  (1.626 m),  weight 194 lb (88 kg), SpO2 98 %. Gen:      No acute distress HEENT:  EOMI, sclera anicteric Neck:     No masses; no thyromegaly Lungs:    Clear to auscultation bilaterally; normal respiratory effort CV:         Regular rate and rhythm; no murmurs Abd:      + bowel sounds; soft, non-tender; no palpable masses, no distension Ext:    No edema; adequate peripheral perfusion Skin:      Warm and dry; no rash Neuro: alert and oriented x 3 Psych: normal mood and affect  Data Reviewed: Imaging: CT 05/10/2018-  pulmonary embolism, bilateral groundglass opacities, 15 mm groundglass nodule in the left upper lobe, 8 mm groundglass nodule in the left lower lobe.  CT 08/26/2018--areas of groundglass attenuation in the mid to upper lung lobes which appear more defined compared to earlier scan   High-resolution 12/09/2018- improvement in groundglass opacities.  There is minimal residual opacity in the lung bases. I have reviewed the images personally.  Labs: CBC 09/27/2018-WBC 5.3, eos 4.2 %, absolute eosinophil count 223  Assessment:  Abnormal CT scan CT imaging reviewed showing bilateral groundglass patchy opacities.  The appearance is suggestive of atypical infection versus versus noninfectious pneumonia. This may be associated with vape related lung injury although she states that vaping CBD started after her first CT scan in July 2019  Currently not vaping since end of December Follow-up CT scan shows residual opacities at the base however it has largely improved She is symptomatically doing better we will hold off on bronchoscope Follow-up in 6 months with chest x-ray.  Plan/Recommendations: -Follow-up with chest x-ray in 6 months.  Marshell Garfinkel MD [facility] 12/16/2018, 12:24 PM  CC: Donald Prose, MD

## 2018-12-16 NOTE — Patient Instructions (Signed)
I am glad your breathing is improving Your CT scan shows improvement in lung opacities We will follow-up in 6 months with a chest x-ray Please call us sooner if there is any change in your symptoms.

## 2018-12-25 DIAGNOSIS — M549 Dorsalgia, unspecified: Secondary | ICD-10-CM | POA: Diagnosis not present

## 2018-12-25 DIAGNOSIS — G90522 Complex regional pain syndrome I of left lower limb: Secondary | ICD-10-CM | POA: Diagnosis not present

## 2018-12-25 DIAGNOSIS — G894 Chronic pain syndrome: Secondary | ICD-10-CM | POA: Diagnosis not present

## 2018-12-25 DIAGNOSIS — B0229 Other postherpetic nervous system involvement: Secondary | ICD-10-CM | POA: Diagnosis not present

## 2019-01-03 ENCOUNTER — Inpatient Hospital Stay (HOSPITAL_COMMUNITY)
Admission: EM | Admit: 2019-01-03 | Discharge: 2019-01-07 | DRG: 092 | Disposition: A | Discharge status: Other Acute Inpatient Hospital | Payer: BLUE CROSS/BLUE SHIELD | Attending: Internal Medicine | Admitting: Internal Medicine

## 2019-01-03 ENCOUNTER — Encounter (HOSPITAL_COMMUNITY): Payer: Self-pay | Admitting: Emergency Medicine

## 2019-01-03 ENCOUNTER — Emergency Department (HOSPITAL_COMMUNITY): Payer: BLUE CROSS/BLUE SHIELD

## 2019-01-03 ENCOUNTER — Observation Stay (HOSPITAL_COMMUNITY): Payer: BLUE CROSS/BLUE SHIELD

## 2019-01-03 DIAGNOSIS — R000 Tachycardia, unspecified: Secondary | ICD-10-CM | POA: Diagnosis not present

## 2019-01-03 DIAGNOSIS — R740 Nonspecific elevation of levels of transaminase and lactic acid dehydrogenase [LDH]: Secondary | ICD-10-CM

## 2019-01-03 DIAGNOSIS — I10 Essential (primary) hypertension: Secondary | ICD-10-CM | POA: Diagnosis not present

## 2019-01-03 DIAGNOSIS — T43015A Adverse effect of tricyclic antidepressants, initial encounter: Secondary | ICD-10-CM | POA: Diagnosis not present

## 2019-01-03 DIAGNOSIS — G9340 Encephalopathy, unspecified: Secondary | ICD-10-CM | POA: Diagnosis present

## 2019-01-03 DIAGNOSIS — D649 Anemia, unspecified: Secondary | ICD-10-CM

## 2019-01-03 DIAGNOSIS — R4182 Altered mental status, unspecified: Secondary | ICD-10-CM | POA: Diagnosis not present

## 2019-01-03 DIAGNOSIS — R52 Pain, unspecified: Secondary | ICD-10-CM | POA: Diagnosis not present

## 2019-01-03 DIAGNOSIS — M6282 Rhabdomyolysis: Secondary | ICD-10-CM

## 2019-01-03 DIAGNOSIS — G8929 Other chronic pain: Secondary | ICD-10-CM | POA: Diagnosis not present

## 2019-01-03 DIAGNOSIS — T43215A Adverse effect of selective serotonin and norepinephrine reuptake inhibitors, initial encounter: Secondary | ICD-10-CM | POA: Diagnosis not present

## 2019-01-03 DIAGNOSIS — R7401 Elevation of levels of liver transaminase levels: Secondary | ICD-10-CM

## 2019-01-03 DIAGNOSIS — T391X5A Adverse effect of 4-Aminophenol derivatives, initial encounter: Secondary | ICD-10-CM | POA: Diagnosis present

## 2019-01-03 DIAGNOSIS — T426X5A Adverse effect of other antiepileptic and sedative-hypnotic drugs, initial encounter: Secondary | ICD-10-CM | POA: Diagnosis present

## 2019-01-03 DIAGNOSIS — Z87891 Personal history of nicotine dependence: Secondary | ICD-10-CM | POA: Diagnosis not present

## 2019-01-03 DIAGNOSIS — M5489 Other dorsalgia: Secondary | ICD-10-CM | POA: Diagnosis not present

## 2019-01-03 DIAGNOSIS — R0902 Hypoxemia: Secondary | ICD-10-CM | POA: Diagnosis not present

## 2019-01-03 DIAGNOSIS — Z9682 Presence of neurostimulator: Secondary | ICD-10-CM

## 2019-01-03 DIAGNOSIS — T507X5A Adverse effect of analeptics and opioid receptor antagonists, initial encounter: Secondary | ICD-10-CM | POA: Diagnosis present

## 2019-01-03 DIAGNOSIS — Z79891 Long term (current) use of opiate analgesic: Secondary | ICD-10-CM | POA: Diagnosis not present

## 2019-01-03 DIAGNOSIS — T403X5A Adverse effect of methadone, initial encounter: Secondary | ICD-10-CM | POA: Diagnosis not present

## 2019-01-03 DIAGNOSIS — G92 Toxic encephalopathy: Secondary | ICD-10-CM | POA: Diagnosis not present

## 2019-01-03 DIAGNOSIS — T424X5A Adverse effect of benzodiazepines, initial encounter: Secondary | ICD-10-CM | POA: Diagnosis present

## 2019-01-03 DIAGNOSIS — Z781 Physical restraint status: Secondary | ICD-10-CM | POA: Diagnosis not present

## 2019-01-03 DIAGNOSIS — M545 Low back pain: Secondary | ICD-10-CM | POA: Diagnosis not present

## 2019-01-03 DIAGNOSIS — E876 Hypokalemia: Secondary | ICD-10-CM | POA: Diagnosis not present

## 2019-01-03 DIAGNOSIS — Z79899 Other long term (current) drug therapy: Secondary | ICD-10-CM

## 2019-01-03 DIAGNOSIS — T43295A Adverse effect of other antidepressants, initial encounter: Secondary | ICD-10-CM | POA: Diagnosis not present

## 2019-01-03 LAB — PROTIME-INR
INR: 0.9 (ref 0.8–1.2)
Prothrombin Time: 12.1 seconds (ref 11.4–15.2)

## 2019-01-03 LAB — COMPREHENSIVE METABOLIC PANEL
ALBUMIN: 3.5 g/dL (ref 3.5–5.0)
ALT: 54 U/L — ABNORMAL HIGH (ref 0–44)
ANION GAP: 10 (ref 5–15)
AST: 68 U/L — ABNORMAL HIGH (ref 15–41)
Alkaline Phosphatase: 80 U/L (ref 38–126)
BUN: 9 mg/dL (ref 6–20)
CALCIUM: 9.1 mg/dL (ref 8.9–10.3)
CO2: 29 mmol/L (ref 22–32)
Chloride: 99 mmol/L (ref 98–111)
Creatinine, Ser: 0.48 mg/dL (ref 0.44–1.00)
GFR calc Af Amer: >60 mL/min (ref >60)
GFR calc non Af Amer: >60 mL/min (ref >60)
GLUCOSE: 123 mg/dL — AB (ref 70–99)
Potassium: 4 mmol/L (ref 3.5–5.1)
SODIUM: 138 mmol/L (ref 135–145)
TOTAL PROTEIN: 6.8 g/dL (ref 6.5–8.1)
Total Bilirubin: 0.6 mg/dL (ref 0.3–1.2)

## 2019-01-03 LAB — BLOOD GAS, VENOUS
Acid-Base Excess: 6.6 mmol/L — ABNORMAL HIGH (ref 0.0–2.0)
Bicarbonate: 31.5 mmol/L — ABNORMAL HIGH (ref 20.0–28.0)
FIO2: 21
O2 Saturation: 86.3 %
Patient temperature: 98.6
pCO2, Ven: 47.7 mmHg (ref 44.0–60.0)
pH, Ven: 7.435 — ABNORMAL HIGH (ref 7.250–7.430)
pO2, Ven: 50.3 mmHg — ABNORMAL HIGH (ref 32.0–45.0)

## 2019-01-03 LAB — URINALYSIS, ROUTINE W REFLEX MICROSCOPIC
Bilirubin Urine: NEGATIVE
Glucose, UA: NEGATIVE mg/dL
Hgb urine dipstick: NEGATIVE
Ketones, ur: 80 mg/dL — AB
Leukocytes,Ua: NEGATIVE
NITRITE: NEGATIVE
Protein, ur: NEGATIVE mg/dL
Specific Gravity, Urine: 1.016 (ref 1.005–1.030)
pH: 7 (ref 5.0–8.0)

## 2019-01-03 LAB — CBC
HCT: 35.9 % — ABNORMAL LOW (ref 36.0–46.0)
Hemoglobin: 11 g/dL — ABNORMAL LOW (ref 12.0–15.0)
MCH: 29.5 pg (ref 26.0–34.0)
MCHC: 30.6 g/dL (ref 30.0–36.0)
MCV: 96.2 fL (ref 80.0–100.0)
PLATELETS: 242 10*3/uL (ref 150–400)
RBC: 3.73 MIL/uL — ABNORMAL LOW (ref 3.87–5.11)
RDW: 12.7 % (ref 11.5–15.5)
WBC: 5.5 10*3/uL (ref 4.0–10.5)
nRBC: 0 % (ref 0.0–0.2)

## 2019-01-03 LAB — RAPID URINE DRUG SCREEN, HOSP PERFORMED
Amphetamines: NOT DETECTED
Barbiturates: NOT DETECTED
Benzodiazepines: NOT DETECTED
Cocaine: NOT DETECTED
Opiates: NOT DETECTED
Tetrahydrocannabinol: NOT DETECTED

## 2019-01-03 LAB — I-STAT BETA HCG BLOOD, ED (MC, WL, AP ONLY)

## 2019-01-03 LAB — CK: Total CK: 1123 U/L — ABNORMAL HIGH (ref 38–234)

## 2019-01-03 LAB — CBG MONITORING, ED: GLUCOSE-CAPILLARY: 123 mg/dL — AB (ref 70–99)

## 2019-01-03 LAB — TSH: TSH: 0.201 u[IU]/mL — ABNORMAL LOW (ref 0.350–4.500)

## 2019-01-03 MED ORDER — LORAZEPAM 2 MG/ML IJ SOLN
1.0000 mg | Freq: Once | INTRAMUSCULAR | Status: AC
  Administered 2019-01-03: 1 mg via INTRAVENOUS
  Filled 2019-01-03: qty 1

## 2019-01-03 MED ORDER — SODIUM CHLORIDE 0.9 % IV BOLUS
1000.0000 mL | Freq: Once | INTRAVENOUS | Status: AC
  Administered 2019-01-03: 1000 mL via INTRAVENOUS

## 2019-01-03 MED ORDER — NALOXONE HCL 0.4 MG/ML IJ SOLN
0.4000 mg | Freq: Once | INTRAMUSCULAR | Status: AC
  Administered 2019-01-03: 0.4 mg via INTRAVENOUS
  Filled 2019-01-03: qty 1

## 2019-01-03 MED ORDER — ENOXAPARIN SODIUM 40 MG/0.4ML ~~LOC~~ SOLN
40.0000 mg | SUBCUTANEOUS | Status: DC
  Administered 2019-01-03 – 2019-01-06 (×4): 40 mg via SUBCUTANEOUS
  Filled 2019-01-03 (×4): qty 0.4

## 2019-01-03 MED ORDER — SODIUM CHLORIDE 0.9% FLUSH
3.0000 mL | Freq: Once | INTRAVENOUS | Status: AC
  Administered 2019-01-03: 3 mL via INTRAVENOUS

## 2019-01-03 MED ORDER — LACTATED RINGERS IV BOLUS
1000.0000 mL | Freq: Once | INTRAVENOUS | Status: AC
  Administered 2019-01-03: 1000 mL via INTRAVENOUS

## 2019-01-03 MED ORDER — SODIUM CHLORIDE 0.9 % IV SOLN
INTRAVENOUS | Status: AC
  Administered 2019-01-03 – 2019-01-04 (×2): via INTRAVENOUS

## 2019-01-03 NOTE — Progress Notes (Signed)
Attempted to scan pt for MRI at 914pm. Pt could not answer questions, and has a spinal cord stimulator. She needs to be able to turn it off in front of tech, and verbalize if she feels any tugging per [employer]. Pt is not alert and oriented enough to communicate. MRI will call in the AM to see if pt condition is more stable and she can communicate with [place] and turn it off.

## 2019-01-03 NOTE — ED Notes (Signed)
Dr. Marlowe Sax consulted about pt MRI scan. MRI Techs advised that Dr. Marlowe Sax is okay with her having the MRI done tomorrow.

## 2019-01-03 NOTE — ED Provider Notes (Signed)
[facility] DEPT Provider Note   CSN: [id number] Arrival date & time: 01/03/19  1742    History   Chief Complaint Chief Complaint  Patient presents with   Altered Mental Status    HPI Danielle Harrington is a 43 y.o. female.     Level 5 caveat due to altered mental status.  Per EMS patient was last well-known yesterday.  Has been sending confusing text messages to family today.  Patient appears alert but does not particularly seem to be able to participate in questioning.  She did appear to mumble "what the f*ck" several times when questioned.   The history is provided by the EMS personnel.  Altered Mental Status  Presenting symptoms: behavior changes   Severity:  Moderate Most recent episode:  Today Episode history:  Continuous Timing:  Constant Progression:  Unchanged   Past Medical History:  Diagnosis Date   ADD (attention deficit disorder)    Anxiety    Arthritis    R knee, sciata treated - injection- 7867   Complication of anesthesia    used scop. patch in the past   Depression    Endometriosis    Family history of adverse reaction to anesthesia    N&V   Headache    migraine, last one 3-4 months ago    History of anemia    History of bronchitis    History of kidney stones    Hypertension    Insomnia    Ambien   PONV (postoperative nausea and vomiting)     Patient Active Problem List   Diagnosis Date Noted   Acute encephalopathy 01/03/2019   Rhabdomyolysis 01/03/2019   Transaminitis 01/03/2019   Anemia 01/03/2019   Complex regional pain syndrome type 1 of left lower extremity 10/09/2016   Morton neuroma, left     Past Surgical History:  Procedure Laterality Date   APPENDECTOMY     CESAREAN SECTION     EXCISION MORTON'S NEUROMA Left 09/29/2016   Procedure: EXCISION MORTON'S NEUROMA LEFT FOOT 3RD WEB SPACE;  Surgeon: Newt Minion, MD;  Location: [facility];  Service: Orthopedics;  Laterality: Left;     KNEE SURGERY Right 1995 & 2009   ACL repair & arthroscopy -2009   LAPAROSCOPIC ENDOMETRIOSIS FULGURATION     OPEN REDUCTION INTERNAL FIXATION (ORIF) FOOT LISFRANC FRACTURE Left 02/18/2016   Procedure: OPEN REDUCTION INTERNAL FIXATION (ORIF) FOOT LISFRANC FRACTURE;  Surgeon: Newt Minion, MD;  Location: [facility];  Service: Orthopedics;  Laterality: Left;   TONSILLECTOMY     ULNAR NERVE REPAIR     WRIST SURGERY Right    torn cartilage      OB History   No obstetric history on file.      Home Medications    Prior to Admission medications   Medication Sig Start Date End Date Taking? Authorizing Provider  buPROPion (WELLBUTRIN XL) 300 MG 24 hr tablet Take 300 mg by mouth daily.   Yes [provider]  ALPRAZolam (XANAX) 0.5 MG tablet 1/2 -1 TABLET ONCE A DAY AS NEEDED WITH MONTHLY LOCKOUT BY MOUTH (30 DAY SUPPLY) Patient taking differently: Take 0.25-0.5 mg by mouth 2 (two) times daily as needed for anxiety.  02/21/17   Newt Minion, MD  DULoxetine (CYMBALTA) 60 MG capsule Take 60 mg by mouth at bedtime.    [provider]  folic acid (FOLVITE) 1 MG tablet Take 1 mg by mouth at bedtime.  06/08/16   [provider]  ibuprofen (ADVIL,MOTRIN) 200 MG tablet Take 800 mg by mouth 3 (three) times daily as needed for moderate pain (pain).     [provider]  levocetirizine (XYZAL) 5 MG tablet Take 5 mg by mouth at bedtime.  01/08/15   [provider]  losartan (COZAAR) 100 MG tablet Take 100 mg by mouth daily. 03/25/18   [provider]  methadone (DOLOPHINE) 5 MG tablet Take 5 mg by mouth 5 (five) times daily.     [provider]  NALTREXONE HCL PO Take 4 mg by mouth at bedtime.     [provider]  nortriptyline (PAMELOR) 25 MG capsule Take 25 mg by mouth at bedtime.    [provider]  nystatin-triamcinolone ointment (MYCOLOG) Apply 1 application topically 2 (two) times daily.  11/08/18   [provider]   oxyCODONE-acetaminophen (PERCOCET/ROXICET) 5-325 MG tablet Take 1 tablet by mouth every 6 (six) hours as needed for moderate pain or severe pain.  11/25/18   [provider]  pregabalin (LYRICA) 300 MG capsule Take 300 mg by mouth 2 (two) times daily.    [provider]  vitamin C (ASCORBIC ACID) 500 MG tablet Take 1,000 mg by mouth every evening.    [provider]  zolpidem (AMBIEN) 10 MG tablet Take 10 mg by mouth at bedtime.  01/11/15   [provider]    Family History Family History  Problem Relation Age of Onset   Cancer Mother    Heart failure Father    Breast cancer Sister     Social History Social History   Tobacco Use   Smoking status: Former Smoker    Packs/day: 0.50    Last attempt to quit: 08/2016    Years since quitting: 2.3   Smokeless tobacco: Never Used   Tobacco comment: Sts she recently quit (08/30/16)  Substance Use Topics   Alcohol use: Yes    Comment: socially- 1-2 drinks per week    Drug use: No     Allergies   Other and Adhesive [tape]   Review of Systems Review of Systems  Unable to perform ROS: Mental status change     Physical Exam Updated Vital Signs  ED Triage Vitals  Enc Vitals Group     BP 01/03/19 1751 (!) 137/111     Pulse Rate 01/03/19 1751 100     Resp 01/03/19 1751 13     Temp 01/03/19 1751 98 F (36.7 C)     Temp Source 01/03/19 1751 Oral     SpO2 01/03/19 1750 98 %     Weight --      Height --      Head Circumference --      Peak Flow --      Pain Score --      Pain Loc --      Pain Edu? --      Excl. in [place]? --     Physical Exam Vitals signs and nursing note reviewed.  Constitutional:      General: She is not in acute distress.    Appearance: She is well-developed. She is not ill-appearing.  HENT:     Head: Normocephalic and atraumatic.     Nose: Nose normal.     Mouth/Throat:     Mouth: Mucous membranes are moist.  Eyes:     Extraocular Movements: Extraocular  movements intact.     Conjunctiva/sclera: Conjunctivae normal.     Pupils: Pupils are equal, round, and  reactive to light.  Neck:     Musculoskeletal: Normal range of motion and neck supple. No muscular tenderness.  Cardiovascular:     Rate and Rhythm: Normal rate and regular rhythm.     Pulses: Normal pulses.     Heart sounds: Normal heart sounds. No murmur.  Pulmonary:     Effort: Pulmonary effort is normal. No respiratory distress.     Breath sounds: Normal breath sounds.  Abdominal:     Palpations: Abdomen is soft.     Tenderness: There is no abdominal tenderness.  Musculoskeletal: Normal range of motion.     Right lower leg: No edema.     Left lower leg: No edema.  Skin:    General: Skin is warm and dry.  Neurological:     General: No focal deficit present.     Mental Status: She is alert.     Comments: Patient appears to be moving all extremities, when I lift up both of her legs she fights against me, unable to test sensation, has normal movement of her eyes, has no gaze deviation, appears catatonic  Psychiatric:        Mood and Affect: Mood normal.      ED Treatments / Results  Labs (all labs ordered are listed, but only abnormal results are displayed) Labs Reviewed  COMPREHENSIVE METABOLIC PANEL - Abnormal; Notable for the following components:      Result Value   Glucose, Bld 123 (*)    AST 68 (*)    ALT 54 (*)    All other components within normal limits  CBC - Abnormal; Notable for the following components:   RBC 3.73 (*)    Hemoglobin 11.0 (*)    HCT 35.9 (*)    All other components within normal limits  BLOOD GAS, VENOUS - Abnormal; Notable for the following components:   pH, Ven 7.435 (*)    pO2, Ven 50.3 (*)    Bicarbonate 31.5 (*)    Acid-Base Excess 6.6 (*)    All other components within normal limits  URINALYSIS, ROUTINE W REFLEX MICROSCOPIC - Abnormal; Notable for the following components:   Ketones, ur 80 (*)    All other components within  normal limits  CK - Abnormal; Notable for the following components:   Total CK 1,123 (*)    All other components within normal limits  CBG MONITORING, ED - Abnormal; Notable for the following components:   Glucose-Capillary 123 (*)    All other components within normal limits  PROTIME-INR  RAPID URINE DRUG SCREEN, HOSP PERFORMED  AMMONIA  HIV ANTIBODY (ROUTINE TESTING W REFLEX)  HEPATIC FUNCTION PANEL  TSH  CK  IRON AND TIBC  FERRITIN  DRUG SCREEN 10 W/CONF, SERUM  I-STAT BETA HCG BLOOD, ED ([facility], [facility], [facility] ONLY)    EKG EKG Interpretation  Date/Time:  Friday January 03 2019 21:47:52 EDT Ventricular Rate:  111 PR Interval:    QRS Duration: 83 QT Interval:  333 QTC Calculation: 453 R Axis:   90 Text Interpretation:  Sinus tachycardia Borderline right axis deviation Low voltage, precordial leads Minimal ST depression, inferior leads Confirmed by Lennice Sites [phone]) on 01/03/2019 9:54:31 PM   Radiology Ct Head Wo Contrast  Result Date: 01/03/2019 CLINICAL DATA:  Altered level of consciousness. No reported injury. EXAM: CT HEAD WITHOUT CONTRAST TECHNIQUE: Contiguous axial images were obtained from the base of the skull through the vertex without intravenous contrast. COMPARISON:  12/30/2013 brain MRI. FINDINGS: Brain: Tiny portion of  the skull base inadvertently excluded from the scan. No evidence of parenchymal hemorrhage or extra-axial fluid collection. No mass lesion, mass effect, or midline shift. No CT evidence of acute infarction. Cerebral volume is age appropriate. No ventriculomegaly. Vascular: No acute abnormality. Skull: No evidence of calvarial fracture. Sinuses/Orbits: The visualized paranasal sinuses are essentially clear. Other:  The mastoid air cells are unopacified. IMPRESSION: Negative head CT. No evidence of acute intracranial abnormality. Electronically Signed   By: Ilona Sorrel M.D.   On: 01/03/2019 18:50   Dg Chest Portable 1 View  Result Date: 01/03/2019 CLINICAL  DATA:  Altered mental status EXAM: PORTABLE CHEST 1 VIEW COMPARISON:  Chest radiograph August 30, 2016 and chest CT December 09, 2018 FINDINGS: There is no edema or consolidation. Heart size and pulmonary vascularity are normal. No adenopathy. No bone lesions. Thoracic stimulator lead tips are in the midthoracic region. IMPRESSION: No edema or consolidation. Electronically Signed   By: Lowella Grip III M.D.   On: 01/03/2019 18:58    Procedures .Critical Care Performed by: Lennice Sites, DO Authorized by: Lennice Sites, DO   Critical care provider statement:    Critical care time (minutes):  45   Critical care was necessary to treat or prevent imminent or life-threatening deterioration of the following conditions:  CNS failure or compromise   Critical care was time spent personally by me on the following activities:  Blood draw for specimens, development of treatment plan with patient or surrogate, discussions with primary provider, evaluation of patient's response to treatment, examination of patient, gastric intubation, obtaining history from patient or surrogate, ordering and performing treatments and interventions, ordering and review of laboratory studies, ordering and review of radiographic studies, pulse oximetry, re-evaluation of patient's condition and review of old charts   I assumed direction of critical care for this patient from another provider in my specialty: no     (including critical care time)  Medications Ordered in ED Medications  enoxaparin (LOVENOX) injection 40 mg (has no administration in time range)  0.9 %  sodium chloride infusion (has no administration in time range)  sodium chloride flush (NS) 0.9 % injection 3 mL (3 mLs Intravenous Given 01/03/19 1920)  lactated ringers bolus 1,000 mL (0 mLs Intravenous Stopped 01/03/19 2052)  LORazepam (ATIVAN) injection 1 mg (1 mg Intravenous Given 01/03/19 1943)  sodium chloride 0.9 % bolus 1,000 mL (1,000 mLs Intravenous  New Bag/Given (Non-Interop) 01/03/19 2159)  naloxone [facility]) injection 0.4 mg (0.4 mg Intravenous Given 01/03/19 2158)     Initial Impression / Assessment and Plan / ED Course  I have reviewed the triage vital signs and the nursing notes.  Pertinent labs & imaging results that were available during my care of the patient were reviewed by me and considered in my medical decision making (see chart for details).    Danielle Harrington is a 43 year old female with history of anxiety, depression, hypertension, insomnia, chronic pain status post spinal cord stimulator who presents to the ED with altered mental status.  Patient overall with unremarkable vitals.  No fever.  According to EMS patient was sending bizarre text messages to family today.  Last known normal was yesterday.  Patient overall appears neurologically intact except she does have some increased tone in her lower extremities which appear to be possibly effort dependent.  However, this could be rigidity.  Patient has normal range of motion of her neck.  She did appear to whisper "what the fu*k" twice.  She has no gaze  deviation, she appears to follow commands intermittently.  She appears awake and alert.  Has good blink reflex bilaterally.  Unable to test sensation.  Patient appears overall catatonic.  According to family member, patient called her "bitch".  However, patient is clearly not at her baseline.  Does not appear to be strokelike.  However will order CT scan.  Also ordered encephalopathic work-up with chest x-ray, urine studies, UDS, ammonia, blood gas.  Patient does have a multiple sedating medications.  She has multiple medications that could possibly cause neuroleptic malignant syndrome versus serotonin syndrome.  However, patient does not have any obvious clonus.  She does not have fever.  Some mild tachycardia.  She does possibly have some rigidity in her legs.  Will give Ativan given these symptoms.  Patient given normal saline bolus.   Overall differential is wide.  Appears to be less infectious in etiology given vital signs.  Patient also with no significant leukocytosis.  Patient with no significant anemia, electrolyte abnormality.  Mild elevation in liver enzymes.  CK is elevated to 1200.  Chest x-ray showed no pneumonia.  Urinalysis showed no infection.  UDS is negative.  CT scan showed no acute process.  Overall very low concern for a bacterial meningitis.  Overall low concern for meningitis.  Suspect likely that this is polypharmacy versus psychiatric.  Does have elevated CK and possible rigidity in her legs and could be neuroleptic malignant syndrome. Less likely serotonin syndrome. Med rec is pending. Patient admitted to medicine service.  Likely needs repeated doses of Ativan and a washout of current medications.  Throughout my care she had normal vitals.  Was alert and protecting her airway. There is no signs of respiratory distress.  Patient would likely benefit from an MRI brian as well but does have a spinal cord stimulator.  Patient admitted to medicine.  This chart was dictated using voice recognition software.  Despite best efforts to proofread,  errors can occur which can change the documentation meaning.    Final Clinical Impressions(s) / ED Diagnoses   Final diagnoses:  Acute encephalopathy  Non-traumatic rhabdomyolysis    ED Discharge Orders    None       Lennice Sites, DO 01/03/19 2340

## 2019-01-03 NOTE — ED Notes (Signed)
In and out cath performed per Dr. Marlowe Sax request. At this time pt hygiene was performed and pt was changed into a hospital gown

## 2019-01-03 NOTE — ED Notes (Signed)
Mother at bedside.

## 2019-01-03 NOTE — ED Notes (Signed)
ED TO INPATIENT HANDOFF REPORT  ED Nurse Name and Phone #: Iantha Fallen, RN [phone]  S Name/Age/Gender Danielle Harrington 43 y.o. female Room/Bed: WA16/WA16  Code Status   Code Status: Full Code  Home/SNF/Other Home Patient oriented to: self Is this baseline? No   Triage Complete: Triage complete  Chief Complaint AMS  Triage Note Pt BIBA from home with altered mental status.  Family called EMS reporting that pt was sending them texts that were nonsensical. EMS arrive to find pt making statements that were random, unfocused.  LKW- last night.  Pt ambulatory short distance on scene, c/o back pain.     Allergies Allergies  Allergen Reactions  . Other Nausea And Vomiting    general anesthesia  . Adhesive [Tape] Itching and Rash    Please use "paper" tape    Level of Care/Admitting Diagnosis ED Disposition    ED Disposition Condition [facility] Area: [place] [[id number]]  Level of Care: Med-Surg [16]  Diagnosis: Acute encephalopathy [119147]  Admitting Physician: Shela Leff [[id number]]  Attending Physician: Shela Leff [[id number]]  PT Class (Do Not Modify): Observation [104]  PT Acc Code (Do Not Modify): Observation [10022]       B Medical/Surgery History Past Medical History:  Diagnosis Date  . ADD (attention deficit disorder)   . Anxiety   . Arthritis    R knee, sciata treated - injection- 2012  . Complication of anesthesia    used scop. patch in the past  . Depression   . Endometriosis   . Family history of adverse reaction to anesthesia    N&V  . Headache    migraine, last one 3-4 months ago   . History of anemia   . History of bronchitis   . History of kidney stones   . Hypertension   . Insomnia    Ambien  . PONV (postoperative nausea and vomiting)    Past Surgical History:  Procedure Laterality Date  . APPENDECTOMY    . CESAREAN SECTION    . EXCISION MORTON'S NEUROMA Left 09/29/2016   Procedure: EXCISION MORTON'S NEUROMA LEFT FOOT 3RD WEB SPACE;  Surgeon: Newt Minion, MD;  Location: [place];  Service: Orthopedics;  Laterality: Left;  . KNEE SURGERY Right 1995 & 2009   ACL repair & arthroscopy -2009  . LAPAROSCOPIC ENDOMETRIOSIS FULGURATION    . OPEN REDUCTION INTERNAL FIXATION (ORIF) FOOT LISFRANC FRACTURE Left 02/18/2016   Procedure: OPEN REDUCTION INTERNAL FIXATION (ORIF) FOOT LISFRANC FRACTURE;  Surgeon: Newt Minion, MD;  Location: [place];  Service: Orthopedics;  Laterality: Left;  . TONSILLECTOMY    . ULNAR NERVE REPAIR    . WRIST SURGERY Right    torn cartilage      A IV Location/Drains/Wounds Patient Lines/Drains/Airways Status   Active Line/Drains/Airways    Name:   Placement date:   Placement time:   Site:   Days:   Peripheral IV 01/03/19 Left Antecubital   01/03/19    -    Antecubital   less than 1   Incision (Closed) 02/18/16 Foot Left   02/18/16    1318     1050   Incision (Closed) 09/29/16 Foot Left   09/29/16    1241     826          Intake/Output Last 24 hours  Intake/Output Summary (Last 24 hours) at 01/03/2019 2120 Last data filed at 01/03/2019 2052 Gross per 24 hour  Intake 999 ml  Output 450 ml  Net 549 ml    Labs/Imaging Results for orders placed or performed during the hospital encounter of 01/03/19 (from the past 48 hour(s))  Comprehensive metabolic panel     Status: Abnormal   Collection Time: 01/03/19  6:05 PM  Result Value Ref Range   Sodium 138 135 - 145 mmol/L   Potassium 4.0 3.5 - 5.1 mmol/L   Chloride 99 98 - 111 mmol/L   CO2 29 22 - 32 mmol/L   Glucose, Bld 123 (H) 70 - 99 mg/dL   BUN 9 6 - 20 mg/dL   Creatinine, Ser 0.48 0.44 - 1.00 mg/dL   Calcium 9.1 8.9 - 10.3 mg/dL   Total Protein 6.8 6.5 - 8.1 g/dL   Albumin 3.5 3.5 - 5.0 g/dL   AST 68 (H) 15 - 41 U/L   ALT 54 (H) 0 - 44 U/L   Alkaline Phosphatase 80 38 - 126 U/L   Total Bilirubin 0.6 0.3 - 1.2 mg/dL   GFR calc non Af Amer >60 >60 mL/min   GFR calc Af Amer >60  >60 mL/min   Anion gap 10 5 - 15    Comment: Performed at [facility], [place] [address] [place], [place] [address]  CBC     Status: Abnormal   Collection Time: 01/03/19  6:05 PM  Result Value Ref Range   WBC 5.5 4.0 - 10.5 K/uL   RBC 3.73 (L) 3.87 - 5.11 MIL/uL   Hemoglobin 11.0 (L) 12.0 - 15.0 g/dL   HCT 35.9 (L) 36.0 - 46.0 %   MCV 96.2 80.0 - 100.0 fL   MCH 29.5 26.0 - 34.0 pg   MCHC 30.6 30.0 - 36.0 g/dL   RDW 12.7 11.5 - 15.5 %   Platelets 242 150 - 400 K/uL   nRBC 0.0 0.0 - 0.2 %    Comment: Performed at [facility], [place] [address] [place], [place] [address]  Protime-INR - (order if patient is taking Coumadin / Warfarin)     Status: None   Collection Time: 01/03/19  6:05 PM  Result Value Ref Range   Prothrombin Time 12.1 11.4 - 15.2 seconds   INR 0.9 0.8 - 1.2    Comment: (NOTE) INR goal varies based on device and disease states. Performed at [facility], [place] [address], [place], [place] [address]   CBG monitoring, ED     Status: Abnormal   Collection Time: 01/03/19  6:24 PM  Result Value Ref Range   Glucose-Capillary 123 (H) 70 - 99 mg/dL  Blood gas, venous (at [facility] and [facility])     Status: Abnormal   Collection Time: 01/03/19  6:50 PM  Result Value Ref Range   FIO2 21.00    pH, Ven 7.435 (H) 7.250 - 7.430   pCO2, Ven 47.7 44.0 - 60.0 mmHg   pO2, Ven 50.3 (H) 32.0 - 45.0 mmHg   Bicarbonate 31.5 (H) 20.0 - 28.0 mmol/L   Acid-Base Excess 6.6 (H) 0.0 - 2.0 mmol/L   O2 Saturation 86.3 %   Patient temperature 98.6    Collection site VEIN    Drawn by COLLECTED BY NURSE    Sample type VENOUS     Comment: Performed at [facility], [place] [address] [place], [place] [address]  I-Stat beta hCG blood, ED     Status: None   Collection Time: 01/03/19  6:55 PM  Result Value Ref Range   I-stat hCG,  quantitative <5.0 <5 mIU/mL   Comment 3            Comment:   GEST. AGE      CONC.  (mIU/mL)   <=1 WEEK         5 - 50     2 WEEKS       50 - 500     3 WEEKS       100 - 10,000     4 WEEKS     1,000 - 30,000        FEMALE AND NON-PREGNANT FEMALE:     LESS THAN 5 mIU/mL   CK     Status: Abnormal   Collection Time: 01/03/19  7:42 PM  Result Value Ref Range   Total CK 1,123 (H) 38 - 234 U/L    Comment: Performed at [facility], [place] [address] [place], [place] [address]   Ct Head Wo Contrast  Result Date: 01/03/2019 CLINICAL DATA:  Altered level of consciousness. No reported injury. EXAM: CT HEAD WITHOUT CONTRAST TECHNIQUE: Contiguous axial images were obtained from the base of the skull through the vertex without intravenous contrast. COMPARISON:  12/30/2013 brain MRI. FINDINGS: Brain: Tiny portion of the skull base inadvertently excluded from the scan. No evidence of parenchymal hemorrhage or extra-axial fluid collection. No mass lesion, mass effect, or midline shift. No CT evidence of acute infarction. Cerebral volume is age appropriate. No ventriculomegaly. Vascular: No acute abnormality. Skull: No evidence of calvarial fracture. Sinuses/Orbits: The visualized paranasal sinuses are essentially clear. Other:  The mastoid air cells are unopacified. IMPRESSION: Negative head CT. No evidence of acute intracranial abnormality. Electronically Signed   By: Ilona Sorrel M.D.   On: 01/03/2019 18:50   Dg Chest Portable 1 View  Result Date: 01/03/2019 CLINICAL DATA:  Altered mental status EXAM: PORTABLE CHEST 1 VIEW COMPARISON:  Chest radiograph August 30, 2016 and chest CT December 09, 2018 FINDINGS: There is no edema or consolidation. Heart size and pulmonary vascularity are normal. No adenopathy. No bone lesions. Thoracic stimulator lead tips are in the midthoracic region. IMPRESSION: No edema or consolidation. Electronically Signed   By: Lowella Grip III M.D.   On: 01/03/2019 18:58    Pending Labs Unresulted Labs (From admission, onward)    Start     Ordered   01/04/19 0500   Hepatic function panel  Tomorrow morning,   R     01/03/19 2043   01/03/19 2043  TSH  ONCE - STAT,   R     01/03/19 2043   01/03/19 2041  HIV antibody (Routine Testing)  Once,   R     01/03/19 2043   01/03/19 1817  Urinalysis, Routine w reflex microscopic  Once,   R     01/03/19 1816   01/03/19 1817  Rapid urine drug screen (hospital performed)  ONCE - STAT,   R     01/03/19 1816   01/03/19 1816  Ammonia  ONCE - STAT,   STAT     01/03/19 1816          Vitals/Pain Today's Vitals   01/03/19 1900 01/03/19 2001 01/03/19 2002 01/03/19 2100  BP: (!) 137/91 (!) 153/99  (!) 145/94  Pulse:  (!) 106  (!) 111  Resp:  14 14 16   Temp:      TempSrc:      SpO2:  94%  97%    Isolation Precautions No active isolations  Medications Medications  enoxaparin (LOVENOX) injection 40 mg (has no administration in time range)  0.9 %  sodium chloride infusion (has no administration in time range)  sodium chloride flush (NS) 0.9 % injection 3 mL (3 mLs Intravenous Given 01/03/19 1920)  lactated ringers bolus 1,000 mL (0 mLs Intravenous Stopped 01/03/19 2052)  LORazepam (ATIVAN) injection 1 mg (1 mg Intravenous Given 01/03/19 1943)    Mobility non-ambulatory High fall risk   Focused Assessments Neuro Assessment Handoff:  Swallow screen pass? untested     Last date known well: 01/02/19   Neuro Assessment:   Neuro Checks:      Last Documented NIHSS Modified Score:   Has TPA been given? No If patient is a Neuro Trauma and patient is going to OR before floor call report to [place] nurse: [phone] or [phone]     R Recommendations: See Admitting Provider Note  Report given to:   Additional Notes:

## 2019-01-03 NOTE — ED Notes (Signed)
Delay in obtaining EKG due to pt going to CT

## 2019-01-03 NOTE — ED Notes (Signed)
Pt eye tracking in room, giving minimal verbal responses.  Pt has muttered under her breath a few words, almost incomprehensible.  Pt providing minimal feedback when attempting to elicit pain responses.

## 2019-01-03 NOTE — ED Triage Notes (Addendum)
Pt BIBA from home with altered mental status.  Family called EMS reporting that pt was sending them texts that were nonsensical. EMS arrive to find pt making statements that were random, unfocused.  LKW- last night.  Pt ambulatory short distance on scene, c/o back pain.

## 2019-01-03 NOTE — H&P (Signed)
History and Physical    Danielle Harrington RFF:[id number] DOB: 1976-05-20 DOA: 01/03/2019  PCP: Donald Prose, MD Patient coming from: Home  Chief Complaint: Altered mental status  HPI: Danielle Harrington is a 43 y.o. female with medical history significant of ADD, anxiety, depression, insomnia, hypertension presenting to the hospital via EMS for evaluation of altered mental status.  When EMS arrived, patient was making statements that were random, unfocused.  Patient is currently confused and it is difficult to obtain a history from her.  She is not able to tell me her name but knows she is at a hospital.  Not oriented to time.  Stated "she is smart" and "who is on the other side of the door."  Mother at bedside states she lives in another town and does not know what exactly happened.  She called the patient this morning she was not making sense over the phone.  Mother states when she arrived the hospital the patient called her a "b*tch." No additional history could be obtained.  Review of Systems: As per HPI otherwise 10 point review of systems negative.  Past Medical History:  Diagnosis Date  . ADD (attention deficit disorder)   . Anxiety   . Arthritis    R knee, sciata treated - injection- 2012  . Complication of anesthesia    used scop. patch in the past  . Depression   . Endometriosis   . Family history of adverse reaction to anesthesia    N&V  . Headache    migraine, last one 3-4 months ago   . History of anemia   . History of bronchitis   . History of kidney stones   . Hypertension   . Insomnia    Ambien  . PONV (postoperative nausea and vomiting)     Past Surgical History:  Procedure Laterality Date  . APPENDECTOMY    . CESAREAN SECTION    . EXCISION MORTON'S NEUROMA Left 09/29/2016   Procedure: EXCISION MORTON'S NEUROMA LEFT FOOT 3RD WEB SPACE;  Surgeon: Newt Minion, MD;  Location: [place];  Service: Orthopedics;  Laterality: Left;  . KNEE SURGERY Right 1995 & 2009   ACL  repair & arthroscopy -2009  . LAPAROSCOPIC ENDOMETRIOSIS FULGURATION    . OPEN REDUCTION INTERNAL FIXATION (ORIF) FOOT LISFRANC FRACTURE Left 02/18/2016   Procedure: OPEN REDUCTION INTERNAL FIXATION (ORIF) FOOT LISFRANC FRACTURE;  Surgeon: Newt Minion, MD;  Location: [place];  Service: Orthopedics;  Laterality: Left;  . TONSILLECTOMY    . ULNAR NERVE REPAIR    . WRIST SURGERY Right    torn cartilage      reports that she quit smoking about 2 years ago. She smoked 0.50 packs per day. She has never used smokeless tobacco. She reports current alcohol use. She reports that she does not use drugs.  Allergies  Allergen Reactions  . Other Nausea And Vomiting    general anesthesia  . Adhesive [Tape] Itching and Rash    Please use "paper" tape    Family History  Problem Relation Age of Onset  . Cancer Mother   . Heart failure Father   . Breast cancer Sister     Prior to Admission medications   Medication Sig Start Date End Date Taking? Authorizing Provider  buPROPion (WELLBUTRIN XL) 300 MG 24 hr tablet Take 300 mg by mouth daily.   Yes [provider]  ALPRAZolam (XANAX) 0.5 MG tablet 1/2 -1 TABLET ONCE A DAY AS NEEDED WITH MONTHLY LOCKOUT  BY MOUTH (30 DAY SUPPLY) Patient taking differently: Take 0.25-0.5 mg by mouth 2 (two) times daily as needed for anxiety.  02/21/17   Newt Minion, MD  DULoxetine (CYMBALTA) 60 MG capsule Take 60 mg by mouth at bedtime.    [provider]  folic acid (FOLVITE) 1 MG tablet Take 1 mg by mouth at bedtime.  06/08/16   [provider]  ibuprofen (ADVIL,MOTRIN) 200 MG tablet Take 800 mg by mouth 3 (three) times daily as needed for moderate pain (pain).     [provider]  levocetirizine (XYZAL) 5 MG tablet Take 5 mg by mouth at bedtime.  01/08/15   [provider]  losartan (COZAAR) 100 MG tablet Take 100 mg by mouth daily. 03/25/18   [provider]  methadone (DOLOPHINE) 5 MG tablet Take 5 mg by mouth 5  (five) times daily.     [provider]  NALTREXONE HCL PO Take 4 mg by mouth at bedtime.     [provider]  nortriptyline (PAMELOR) 25 MG capsule Take 25 mg by mouth at bedtime.    [provider]  nystatin-triamcinolone ointment (MYCOLOG) Apply 1 application topically 2 (two) times daily.  11/08/18   [provider]  oxyCODONE-acetaminophen (PERCOCET/ROXICET) 5-325 MG tablet Take 1 tablet by mouth every 6 (six) hours as needed for moderate pain or severe pain.  11/25/18   [provider]  pregabalin (LYRICA) 300 MG capsule Take 300 mg by mouth 2 (two) times daily.    [provider]  vitamin C (ASCORBIC ACID) 500 MG tablet Take 1,000 mg by mouth every evening.    [provider]  zolpidem (AMBIEN) 10 MG tablet Take 10 mg by mouth at bedtime.  01/11/15   [provider]    Physical Exam: Vitals:   01/03/19 1900 01/03/19 2001 01/03/19 2002 01/03/19 2100  BP: (!) 137/91 (!) 153/99  (!) 145/94  Pulse:  (!) 106  (!) 111  Resp:  14 14 16   Temp:      TempSrc:      SpO2:  94%  97%    Physical Exam  Constitutional: She appears well-developed and well-nourished. No distress.  HENT:  Head: Normocephalic.  Mouth/Throat: Oropharynx is clear and moist.  Eyes:  Pupils dilated but equal in size and reactive to light  Neck: Neck supple.  No nuchal rigidity  Cardiovascular: Normal rate, regular rhythm and intact distal pulses.  Pulmonary/Chest: Effort normal.  Anterior lung fields clear to auscultation  Abdominal: Soft. Bowel sounds are normal. She exhibits no distension. There is no abdominal tenderness. There is no guarding.  Musculoskeletal:        General: No edema.  Neurological:  Awake Not answering questions. Following commands intermittently with very delayed response. Wiggling fingers of her left hand on command but is not raising the arm.  Raising right arm on command.  She is not raising her legs but is wiggling  toes bilaterally on command.  Skin: Skin is warm and dry. She is not diaphoretic.     Labs on Admission: I have personally reviewed following labs and imaging studies  CBC: Recent Labs  Lab 01/03/19 1805  WBC 5.5  HGB 11.0*  HCT 35.9*  MCV 96.2  PLT 559   Basic Metabolic Panel: Recent Labs  Lab 01/03/19 1805  NA 138  K 4.0  CL 99  CO2 29  GLUCOSE 123*  BUN 9  CREATININE 0.48  CALCIUM 9.1   GFR:  CrCl cannot be calculated (Unknown ideal weight.). Liver Function Tests: Recent Labs  Lab 01/03/19 1805  AST 68*  ALT 54*  ALKPHOS 80  BILITOT 0.6  PROT 6.8  ALBUMIN 3.5   No results for input(s): LIPASE, AMYLASE in the last 168 hours. No results for input(s): AMMONIA in the last 168 hours. Coagulation Profile: Recent Labs  Lab 01/03/19 1805  INR 0.9   Cardiac Enzymes: Recent Labs  Lab 01/03/19 1942  CKTOTAL 1,123*   BNP (last 3 results) No results for input(s): PROBNP in the last 8760 hours. HbA1C: No results for input(s): HGBA1C in the last 72 hours. CBG: Recent Labs  Lab 01/03/19 1824  GLUCAP 123*   Lipid Profile: No results for input(s): CHOL, HDL, LDLCALC, TRIG, CHOLHDL, LDLDIRECT in the last 72 hours. Thyroid Function Tests: No results for input(s): TSH, T4TOTAL, FREET4, T3FREE, THYROIDAB in the last 72 hours. Anemia Panel: No results for input(s): VITAMINB12, FOLATE, FERRITIN, TIBC, IRON, RETICCTPCT in the last 72 hours. Urine analysis:    Component Value Date/Time   COLORURINE YELLOW 01/03/2019 2052   APPEARANCEUR CLEAR 01/03/2019 2052   LABSPEC 1.016 01/03/2019 2052   PHURINE 7.0 01/03/2019 2052   GLUCOSEU NEGATIVE 01/03/2019 2052   HGBUR NEGATIVE 01/03/2019 2052   BILIRUBINUR NEGATIVE 01/03/2019 2052   KETONESUR 80 (A) 01/03/2019 2052   PROTEINUR NEGATIVE 01/03/2019 2052   UROBILINOGEN 0.2 05/08/2018 1901   NITRITE NEGATIVE 01/03/2019 2052   LEUKOCYTESUR NEGATIVE 01/03/2019 2052    Radiological Exams on Admission: Ct Head Wo  Contrast  Result Date: 01/03/2019 CLINICAL DATA:  Altered level of consciousness. No reported injury. EXAM: CT HEAD WITHOUT CONTRAST TECHNIQUE: Contiguous axial images were obtained from the base of the skull through the vertex without intravenous contrast. COMPARISON:  12/30/2013 brain MRI. FINDINGS: Brain: Tiny portion of the skull base inadvertently excluded from the scan. No evidence of parenchymal hemorrhage or extra-axial fluid collection. No mass lesion, mass effect, or midline shift. No CT evidence of acute infarction. Cerebral volume is age appropriate. No ventriculomegaly. Vascular: No acute abnormality. Skull: No evidence of calvarial fracture. Sinuses/Orbits: The visualized paranasal sinuses are essentially clear. Other:  The mastoid air cells are unopacified. IMPRESSION: Negative head CT. No evidence of acute intracranial abnormality. Electronically Signed   By: Ilona Sorrel M.D.   On: 01/03/2019 18:50   Dg Chest Portable 1 View  Result Date: 01/03/2019 CLINICAL DATA:  Altered mental status EXAM: PORTABLE CHEST 1 VIEW COMPARISON:  Chest radiograph August 30, 2016 and chest CT December 09, 2018 FINDINGS: There is no edema or consolidation. Heart size and pulmonary vascularity are normal. No adenopathy. No bone lesions. Thoracic stimulator lead tips are in the midthoracic region. IMPRESSION: No edema or consolidation. Electronically Signed   By: Lowella Grip III M.D.   On: 01/03/2019 18:58    EKG: Independently reviewed.  Sinus tachycardia (heart rate 102).  Assessment/Plan Principal Problem:   Acute encephalopathy Active Problems:   Rhabdomyolysis   Transaminitis   Anemia   Acute encephalopathy -Infectious etiology less likely as patient is afebrile and does not have leukocytosis.  No nuchal rigidity noted on exam.  UA not suggestive of infection.  Chest x-ray not suggestive of pneumonia. -Head CT negative for acute finding. -Polypharmacy is also on the differential.  Xanax,  Percocet, Ambien, Cymbalta, Wellbutrin, methadone, nortriptyline, and Lyrica listed in her home medications, although it is not clear which ones are her current medications. -Brain MRI with and without contrast ordered.  Unable to do  at this time as patient has a spinal cord stimulator, no details can be obtained from the patient or family at this time. -Check urine and serum drug screens -Check TSH level -Check ammonia level -Discussed with neurology.  Her presentation is thought to be possibly due to polypharmacy.  Agree with work-up mentioned above.  Recommending one-time dose of Narcan to see if patient becomes more alert. Also recommending EEG and transfer to [facility] for further work-up.  Rhabdomyolysis -CK elevated at 1123.  Renal function at baseline and UA without evidence of myoglobinuria. -Aggressive IV fluid hydration -Continue to monitor CK  Mild transaminitis -Likely related to rhabdomyolysis.  AST 68 and ALT 54.  Alk phos and T bili normal. -Aggressive IV fluid hydration for rhabdo as mentioned above -Continue to monitor hepatic function panel  Normocytic anemia -Hemoglobin 11.0, baseline 11.9-12.0.  No signs of active bleeding. -Check iron, ferritin, TIBC  DVT prophylaxis: Lovenox Code Status: Full code Family Communication: Mother and sister at bedside. Disposition Plan: Anticipate discharge after clinical improvement. Consults called: Neurology (Dr. Rory Percy) Admission status: Observation   Shela Leff MD [facility] Pager [phone]  If 7PM-7AM, please contact night-coverage [email or web address] Password [facility]  01/03/2019, 10:06 PM

## 2019-01-03 NOTE — ED Notes (Signed)
Bed: BC48 Expected date:  Expected time:  Means of arrival:  Comments: 43 yo AMS, medical clearance

## 2019-01-04 ENCOUNTER — Observation Stay (HOSPITAL_COMMUNITY): Payer: BLUE CROSS/BLUE SHIELD

## 2019-01-04 DIAGNOSIS — T43295A Adverse effect of other antidepressants, initial encounter: Secondary | ICD-10-CM | POA: Diagnosis not present

## 2019-01-04 DIAGNOSIS — G5772 Causalgia of left lower limb: Secondary | ICD-10-CM | POA: Diagnosis not present

## 2019-01-04 DIAGNOSIS — F1120 Opioid dependence, uncomplicated: Secondary | ICD-10-CM | POA: Diagnosis not present

## 2019-01-04 DIAGNOSIS — T424X5A Adverse effect of benzodiazepines, initial encounter: Secondary | ICD-10-CM | POA: Diagnosis not present

## 2019-01-04 DIAGNOSIS — F13231 Sedative, hypnotic or anxiolytic dependence with withdrawal delirium: Secondary | ICD-10-CM | POA: Diagnosis not present

## 2019-01-04 DIAGNOSIS — G8929 Other chronic pain: Secondary | ICD-10-CM | POA: Diagnosis not present

## 2019-01-04 DIAGNOSIS — G4700 Insomnia, unspecified: Secondary | ICD-10-CM | POA: Diagnosis not present

## 2019-01-04 DIAGNOSIS — Z781 Physical restraint status: Secondary | ICD-10-CM | POA: Diagnosis not present

## 2019-01-04 DIAGNOSIS — Z79899 Other long term (current) drug therapy: Secondary | ICD-10-CM | POA: Diagnosis not present

## 2019-01-04 DIAGNOSIS — Z87442 Personal history of urinary calculi: Secondary | ICD-10-CM | POA: Diagnosis not present

## 2019-01-04 DIAGNOSIS — Z9682 Presence of neurostimulator: Secondary | ICD-10-CM | POA: Diagnosis not present

## 2019-01-04 DIAGNOSIS — D649 Anemia, unspecified: Secondary | ICD-10-CM | POA: Diagnosis not present

## 2019-01-04 DIAGNOSIS — T391X5A Adverse effect of 4-Aminophenol derivatives, initial encounter: Secondary | ICD-10-CM | POA: Diagnosis not present

## 2019-01-04 DIAGNOSIS — T403X5A Adverse effect of methadone, initial encounter: Secondary | ICD-10-CM | POA: Diagnosis not present

## 2019-01-04 DIAGNOSIS — F13239 Sedative, hypnotic or anxiolytic dependence with withdrawal, unspecified: Secondary | ICD-10-CM | POA: Diagnosis not present

## 2019-01-04 DIAGNOSIS — Z91048 Other nonmedicinal substance allergy status: Secondary | ICD-10-CM | POA: Diagnosis not present

## 2019-01-04 DIAGNOSIS — G9340 Encephalopathy, unspecified: Secondary | ICD-10-CM | POA: Diagnosis not present

## 2019-01-04 DIAGNOSIS — T426X5A Adverse effect of other antiepileptic and sedative-hypnotic drugs, initial encounter: Secondary | ICD-10-CM | POA: Diagnosis not present

## 2019-01-04 DIAGNOSIS — F322 Major depressive disorder, single episode, severe without psychotic features: Secondary | ICD-10-CM | POA: Diagnosis not present

## 2019-01-04 DIAGNOSIS — T507X5A Adverse effect of analeptics and opioid receptor antagonists, initial encounter: Secondary | ICD-10-CM | POA: Diagnosis not present

## 2019-01-04 DIAGNOSIS — T43215A Adverse effect of selective serotonin and norepinephrine reuptake inhibitors, initial encounter: Secondary | ICD-10-CM | POA: Diagnosis not present

## 2019-01-04 DIAGNOSIS — Z803 Family history of malignant neoplasm of breast: Secondary | ICD-10-CM | POA: Diagnosis not present

## 2019-01-04 DIAGNOSIS — R4182 Altered mental status, unspecified: Secondary | ICD-10-CM | POA: Diagnosis not present

## 2019-01-04 DIAGNOSIS — Z884 Allergy status to anesthetic agent status: Secondary | ICD-10-CM | POA: Diagnosis not present

## 2019-01-04 DIAGNOSIS — I10 Essential (primary) hypertension: Secondary | ICD-10-CM | POA: Diagnosis not present

## 2019-01-04 DIAGNOSIS — Z87891 Personal history of nicotine dependence: Secondary | ICD-10-CM | POA: Diagnosis not present

## 2019-01-04 DIAGNOSIS — G92 Toxic encephalopathy: Secondary | ICD-10-CM | POA: Diagnosis not present

## 2019-01-04 DIAGNOSIS — M6282 Rhabdomyolysis: Secondary | ICD-10-CM | POA: Diagnosis not present

## 2019-01-04 DIAGNOSIS — Z8249 Family history of ischemic heart disease and other diseases of the circulatory system: Secondary | ICD-10-CM | POA: Diagnosis not present

## 2019-01-04 DIAGNOSIS — E876 Hypokalemia: Secondary | ICD-10-CM | POA: Diagnosis not present

## 2019-01-04 DIAGNOSIS — M1711 Unilateral primary osteoarthritis, right knee: Secondary | ICD-10-CM | POA: Diagnosis not present

## 2019-01-04 DIAGNOSIS — Z79891 Long term (current) use of opiate analgesic: Secondary | ICD-10-CM | POA: Diagnosis not present

## 2019-01-04 DIAGNOSIS — T43015A Adverse effect of tricyclic antidepressants, initial encounter: Secondary | ICD-10-CM | POA: Diagnosis not present

## 2019-01-04 LAB — HEPATIC FUNCTION PANEL
ALT: 43 U/L (ref 0–44)
AST: 44 U/L — ABNORMAL HIGH (ref 15–41)
Albumin: 2.9 g/dL — ABNORMAL LOW (ref 3.5–5.0)
Alkaline Phosphatase: 62 U/L (ref 38–126)
BILIRUBIN TOTAL: 0.7 mg/dL (ref 0.3–1.2)
Bilirubin, Direct: <0.1 mg/dL (ref 0.0–0.2)
Total Protein: 5.8 g/dL — ABNORMAL LOW (ref 6.5–8.1)

## 2019-01-04 LAB — IRON AND TIBC
Iron: 45 ug/dL (ref 28–170)
Saturation Ratios: 14 % (ref 10.4–31.8)
TIBC: 322 ug/dL (ref 250–450)
UIBC: 277 ug/dL

## 2019-01-04 LAB — FERRITIN: Ferritin: 49 ng/mL (ref 11–307)

## 2019-01-04 LAB — T4, FREE: FREE T4: 0.76 ng/dL — AB (ref 0.82–1.77)

## 2019-01-04 LAB — HIV ANTIBODY (ROUTINE TESTING W REFLEX): HIV SCREEN 4TH GENERATION: NONREACTIVE

## 2019-01-04 LAB — AMMONIA: AMMONIA: 18 umol/L (ref 9–35)

## 2019-01-04 LAB — CK: Total CK: 654 U/L — ABNORMAL HIGH (ref 38–234)

## 2019-01-04 MED ORDER — SODIUM CHLORIDE 0.9 % IV SOLN
INTRAVENOUS | Status: DC
  Administered 2019-01-04 – 2019-01-06 (×3): via INTRAVENOUS

## 2019-01-04 MED ORDER — HYDRALAZINE HCL 20 MG/ML IJ SOLN
10.0000 mg | Freq: Four times a day (QID) | INTRAMUSCULAR | Status: DC | PRN
  Administered 2019-01-06: 10 mg via INTRAVENOUS
  Filled 2019-01-04: qty 1

## 2019-01-04 MED ORDER — POLYVINYL ALCOHOL 1.4 % OP SOLN
1.0000 [drp] | OPHTHALMIC | Status: DC | PRN
  Filled 2019-01-04 (×2): qty 15

## 2019-01-04 MED ORDER — SODIUM CHLORIDE 0.9 % IV SOLN: INTRAVENOUS | Status: DC

## 2019-01-04 NOTE — ED Notes (Signed)
[facility] arrived to transport pt to [facility]

## 2019-01-04 NOTE — Progress Notes (Signed)
   01/04/19 1318 01/04/19 1447  Urine Characteristics  Urinary Incontinence  --  Yes  Urine Color  --  Yellow/straw  Urine Appearance  --  Clear  Bladder Scan Volume (mL) 629 mL  --   Intermittent/Straight Cath (mL)  --  900 mL   Patient bladder can showed 612m, patient unable to void. MD paged for a in and out cath order. Cath retrieved 9075mof clear yellow urine.

## 2019-01-04 NOTE — Procedures (Signed)
ELECTROENCEPHALOGRAM REPORT   Patient: Danielle Harrington       Room #: 6V78H EEG No. ID: [id number] Age: 43 y.o.        Sex: female Referring Physician: Maylene Roes Report Date:  01/04/2019        Interpreting Physician: Alexis Goodell  History: Danielle Harrington is an 43 y.o. female with altered mental status  Medications:  None  Conditions of Recording:  This is a 21 channel routine scalp EEG performed with bipolar and monopolar montages arranged in accordance to the international 10/20 system of electrode placement. One channel was dedicated to EKG recording.  The patient is in the altered state.  Description:  The posterior background activity is slow.  It consists of a low voltage, symmetrical, fairly well organized, 6 Hz theta activity, seen from the parieto-occipital and posterior temporal regions.  Low voltage fast activity, poorly organized, is seen anteriorly and is at times superimposed on more posterior regions.  A mixture of mostly theta but some alpha rhythms are seen from the central and temporal regions. The patient does not drowse or sleep.  There is fairly continuous rolling-type movements of the RUE and some movement of the lower extremities.  There is no change in the background activity despite these movements.  Hyperventilation and intermittent photic stimulation were not performed.   IMPRESSION: This is an abnormal EEG secondary to general background slowing.  This finding may be seen with a diffuse disturbance that is etiologically nonspecific, but may include a metabolic encephalopathy, among other possibilities.  No epileptiform activity was noted.     Alexis Goodell, MD Neurology [phone] 01/04/2019, 10:04 AM

## 2019-01-04 NOTE — Progress Notes (Signed)
EEG completed; results pending.    

## 2019-01-04 NOTE — ED Notes (Signed)
[facility] contacted for transport

## 2019-01-04 NOTE — TOC Progression Note (Signed)
Transition of Care [facility]) - Progression Note    Patient Details  Name: Danielle Harrington MRN: [id number] Date of Birth: 01/02/1976  Transition of Care [facility]) CM/SW Contact  Carles Collet, RN Phone Number: 01/04/2019, 7:52 AM  Clinical Narrative:      Acknowledge consult for "possible DC needs." Please resconsult for specific DC needs as they are identified.       Expected Discharge Plan and Services         Expected Discharge Date: (unknown)                         Social Determinants of Health (SDOH) Interventions    Readmission Risk Interventions  No flowsheet data found.

## 2019-01-04 NOTE — Progress Notes (Addendum)
PROGRESS NOTE    Danielle FLORIDO  Harrington:[id number] DOB: [birth year] DOA: 01/03/2019 PCP: Donald Prose, MD     Brief Narrative:  Danielle Harrington is a 43 yo female with past medical history significant for ADD, anxiety, depression, insomnia, hypertension, chronic pain on methadone who presented to the hospital via EMS for evaluation of altered mental status.  When EMS arrived, patient was making statements that were random, unfocused. History gathered from physicians and mother and sister.  Apparently, mom was in contact with the patient on Thursday night.  At that time, patient seemed herself, did not complain of anything specific.  Friday morning, mother noticed that patient was not making any sense.  Per report, patient kept repeating "I don't know, I don't know." Very unclear what happened prior to hospitalization.  Case was discussed with neurologist overnight, recommended for EEG, trial Narcan.  Possible that her presentation is due to polypharmacy.  New events last 24 hours / Subjective: On examination, patient appears to be in a catatonic state.  She is alert, tracks me with her eyes.  She is able to follow some commands such as squeezing my hands and moving her feet, but not other commands such as sticking out her tongue.  She does not appear to be in any distress or in pain. She does not verbalize complaints and does not communicate.    Per family, patient's normal baseline state is completely functional, patient currently works, lives alone, completely independent of activities of daily living.  Assessment & Plan:   Principal Problem:   Acute encephalopathy Active Problems:   Rhabdomyolysis   Transaminitis   Anemia   Acute toxic/metabolic encephalopathy -Question if this is secondary to polypharmacy.  Patient is currently taking Xanax, Wellbutrin, Cymbalta, methadone, naltrexone, Pamelor, Percocet, Lyrica, Ambien.  Also unclear if she has any illicit drug history.  She does vape per  family -?Serotonin syndrome although patient does not have hyperthermia or hyperreflexia or diaphoresis, she is not agitated or restless on examination -Ammonia level 18 -?Seizure.  EEG revealed general background slowing without epileptiform activity.  She does have mildly elevated CK -Does not seem infectious at this time, she has no rigidity with neck flexion, no fever, normal WBC -Initial at rapid urine drug screen was negative, serum drug screen is pending -CT head without contrast, no evidence of acute intracranial abnormality -MRI brain currently unable to be completed due to her spinal cord stimulator -Narcan given without change in mentation  -Hold her home medications, observe for change in mentation, if no improvement next 24-48h, will consider neurology evaluation   Low TSH and T4 -Not consistent with hypothyroidism.  Free T3 is pending   DVT prophylaxis: Lovenox Code Status: Full Family Communication: Mother and sister at bedside Disposition Plan: Pending improvement in mentation.   Patient to move to inpatient status.  Her mentation is nowhere close to her baseline, with broad differential diagnoses, and without any improvement in her mentation yet.   Consultants:   None  Procedures:   None   Antimicrobials:  Anti-infectives (From admission, onward)   None        Objective: Vitals:   01/04/19 0520 01/04/19 0642 01/04/19 0914 01/04/19 1130  BP: (!) 147/94 (!) 152/93 (!) 144/105 (!) 153/97  Pulse: (!) 104 (!) 102 (!) 102 (!) 104  Resp: 19 18 15 16   Temp:  98.6 F (37 C) 98.5 F (36.9 C) 98.5 F (36.9 C)  TempSrc:  Oral Oral Oral  SpO2: 94% 95%  94% 95%    Intake/Output Summary (Last 24 hours) at 01/04/2019 1244 Last data filed at 01/04/2019 1204 Gross per 24 hour  Intake 3669.64 ml  Output 1350 ml  Net 2319.64 ml   There were no vitals filed for this visit.  Examination:  General exam: Appears calm and comfortable, alert, not interactive    Respiratory system: Clear to auscultation. Respiratory effort normal. Cardiovascular system: S1 & S2 heard, tachycardic, regular rhythm. No JVD, murmurs, rubs, gallops or clicks. No pedal edema. Gastrointestinal system: Abdomen is nondistended, soft and nontender. No organomegaly or masses felt. Normal bowel sounds heard. Central nervous system: Alert, moves all 4 extremities to command, but does not follow all commands, does not answer any questions, normal patellar reflexes  Extremities: Symmetric Skin: No rashes, lesions or ulcers   Data Reviewed: I have personally reviewed following labs and imaging studies  CBC: Recent Labs  Lab 01/03/19 1805  WBC 5.5  HGB 11.0*  HCT 35.9*  MCV 96.2  PLT 378   Basic Metabolic Panel: Recent Labs  Lab 01/03/19 1805  NA 138  K 4.0  CL 99  CO2 29  GLUCOSE 123*  BUN 9  CREATININE 0.48  CALCIUM 9.1   GFR: CrCl cannot be calculated (Unknown ideal weight.). Liver Function Tests: Recent Labs  Lab 01/03/19 1805 01/04/19 1034  AST 68* 44*  ALT 54* 43  ALKPHOS 80 62  BILITOT 0.6 0.7  PROT 6.8 5.8*  ALBUMIN 3.5 2.9*   No results for input(s): LIPASE, AMYLASE in the last 168 hours. Recent Labs  Lab 01/04/19 1034  AMMONIA 18   Coagulation Profile: Recent Labs  Lab 01/03/19 1805  INR 0.9   Cardiac Enzymes: Recent Labs  Lab 01/03/19 1942 01/04/19 1034  CKTOTAL 1,123* 654*   BNP (last 3 results) No results for input(s): PROBNP in the last 8760 hours. HbA1C: No results for input(s): HGBA1C in the last 72 hours. CBG: Recent Labs  Lab 01/03/19 1824  GLUCAP 123*   Lipid Profile: No results for input(s): CHOL, HDL, LDLCALC, TRIG, CHOLHDL, LDLDIRECT in the last 72 hours. Thyroid Function Tests: Recent Labs    01/03/19 2219 01/04/19 0340  TSH 0.201*  --   FREET4  --  0.76*   Anemia Panel: Recent Labs    01/04/19 1034  FERRITIN 49  TIBC 322  IRON 45   Sepsis Labs: No results for input(s): PROCALCITON,  LATICACIDVEN in the last 168 hours.  No results found for this or any previous visit (from the past 240 hour(s)).     Radiology Studies: Ct Head Wo Contrast  Result Date: 01/03/2019 CLINICAL DATA:  Altered level of consciousness. No reported injury. EXAM: CT HEAD WITHOUT CONTRAST TECHNIQUE: Contiguous axial images were obtained from the base of the skull through the vertex without intravenous contrast. COMPARISON:  12/30/2013 brain MRI. FINDINGS: Brain: Tiny portion of the skull base inadvertently excluded from the scan. No evidence of parenchymal hemorrhage or extra-axial fluid collection. No mass lesion, mass effect, or midline shift. No CT evidence of acute infarction. Cerebral volume is age appropriate. No ventriculomegaly. Vascular: No acute abnormality. Skull: No evidence of calvarial fracture. Sinuses/Orbits: The visualized paranasal sinuses are essentially clear. Other:  The mastoid air cells are unopacified. IMPRESSION: Negative head CT. No evidence of acute intracranial abnormality. Electronically Signed   By: Ilona Sorrel M.D.   On: 01/03/2019 18:50   Dg Chest Portable 1 View  Result Date: 01/03/2019 CLINICAL DATA:  Altered mental status EXAM: PORTABLE CHEST  1 VIEW COMPARISON:  Chest radiograph August 30, 2016 and chest CT December 09, 2018 FINDINGS: There is no edema or consolidation. Heart size and pulmonary vascularity are normal. No adenopathy. No bone lesions. Thoracic stimulator lead tips are in the midthoracic region. IMPRESSION: No edema or consolidation. Electronically Signed   By: Lowella Grip III M.D.   On: 01/03/2019 18:58      Scheduled Meds:  enoxaparin (LOVENOX) injection  40 mg Subcutaneous Q24H   Continuous Infusions:  sodium chloride       LOS: 0 days    Time spent: 50 minutes   Dessa Phi, DO [facility] [email or web address] 01/04/2019, 12:44 PM

## 2019-01-05 ENCOUNTER — Inpatient Hospital Stay (HOSPITAL_COMMUNITY): Payer: BLUE CROSS/BLUE SHIELD

## 2019-01-05 LAB — BASIC METABOLIC PANEL
ANION GAP: 12 (ref 5–15)
BUN: 5 mg/dL — AB (ref 6–20)
CO2: 22 mmol/L (ref 22–32)
Calcium: 8.6 mg/dL — ABNORMAL LOW (ref 8.9–10.3)
Chloride: 102 mmol/L (ref 98–111)
Creatinine, Ser: 0.52 mg/dL (ref 0.44–1.00)
GFR calc Af Amer: >60 mL/min (ref >60)
GFR calc non Af Amer: >60 mL/min (ref >60)
GLUCOSE: 77 mg/dL (ref 70–99)
Potassium: 2.8 mmol/L — ABNORMAL LOW (ref 3.5–5.1)
Sodium: 136 mmol/L (ref 135–145)

## 2019-01-05 LAB — CBC
HCT: 36.6 % (ref 36.0–46.0)
Hemoglobin: 12 g/dL (ref 12.0–15.0)
MCH: 29.1 pg (ref 26.0–34.0)
MCHC: 32.8 g/dL (ref 30.0–36.0)
MCV: 88.8 fL (ref 80.0–100.0)
Platelets: 294 10*3/uL (ref 150–400)
RBC: 4.12 MIL/uL (ref 3.87–5.11)
RDW: 12.6 % (ref 11.5–15.5)
WBC: 8.8 10*3/uL (ref 4.0–10.5)
nRBC: 0 % (ref 0.0–0.2)

## 2019-01-05 LAB — MAGNESIUM: MAGNESIUM: 1.6 mg/dL — AB (ref 1.7–2.4)

## 2019-01-05 LAB — T3, FREE: T3, Free: 2.1 pg/mL (ref 2.0–4.4)

## 2019-01-05 MED ORDER — GADOBUTROL 1 MMOL/ML IV SOLN
9.0000 mL | Freq: Once | INTRAVENOUS | Status: AC | PRN
  Administered 2019-01-05: 9 mL via INTRAVENOUS

## 2019-01-05 MED ORDER — POTASSIUM CHLORIDE 10 MEQ/100ML IV SOLN
10.0000 meq | INTRAVENOUS | Status: AC
  Administered 2019-01-05 (×5): 10 meq via INTRAVENOUS
  Filled 2019-01-05 (×9): qty 100

## 2019-01-05 MED ORDER — MAGNESIUM SULFATE 2 GM/50ML IV SOLN
2.0000 g | Freq: Once | INTRAVENOUS | Status: AC
  Administered 2019-01-05: 2 g via INTRAVENOUS
  Filled 2019-01-05: qty 50

## 2019-01-05 NOTE — Progress Notes (Signed)
Pt came down to MRI, and once we were turning her stimulator off, we noticed her stimulator battery did not have 3 bars of charge that was one of the conditions for her MRI scan. I called [phone] and spoke with a [place] named Delton See and he informed me that all 3 bars need to be charged in order for her to meet the scanning conditions. Pt family member notified and is going to go to patient's home to get charger for device and once pt has 3 bars on battery, RN will call and we can scan patient safely. Charge can take 1-3 hrs per BS rep. RN notified and pt sent back to her room until stimulator is fully charged.

## 2019-01-05 NOTE — Consult Note (Addendum)
NEURO HOSPITALIST CONSULT NOTE   Requestig physician: Dr. Maylene Roes  Reason for Consult:AMS  History obtained from:  Chart  HPI:                                                                                                                                          Danielle Harrington is an 43 y.o. female PMH significant for ADD, anxiety, depression, insomnia, HTN, spinal cord stimulator and chronic pain  (on methadone) who presented to [facility] ED on 01/03/2019 for AMS.   Per chart: Thursday night was the last time she was known to be normal. Per mother she did talk to her and she seemed to be her normal self.  Friday morning the mother noticed that she was not making any sense patient kept repeating " I don't know". She was given narcan with no change in presentation. Per family no known history of drug use; maybe occasional marijuana and previous vaping, occasionally drinks wine. Patient has a spinal cord stimulator.  Hospital course:  01/03/2019 admitted, all medication stopped, narcan given without improvement.  01/05/2019 neurology consulted for continued AMS.   Past Medical History:  Diagnosis Date  . ADD (attention deficit disorder)   . Anxiety   . Arthritis    R knee, sciata treated - injection- 2012  . Complication of anesthesia    used scop. patch in the past  . Depression   . Endometriosis   . Family history of adverse reaction to anesthesia    N&V  . Headache    migraine, last one 3-4 months ago   . History of anemia   . History of bronchitis   . History of kidney stones   . Hypertension   . Insomnia    Ambien  . PONV (postoperative nausea and vomiting)     Past Surgical History:  Procedure Laterality Date  . APPENDECTOMY    . CESAREAN SECTION    . EXCISION MORTON'S NEUROMA Left 09/29/2016   Procedure: EXCISION MORTON'S NEUROMA LEFT FOOT 3RD WEB SPACE;  Surgeon: Newt Minion, MD;  Location: [facility];  Service: Orthopedics;  Laterality: Left;  . KNEE  SURGERY Right 1995 & 2009   ACL repair & arthroscopy -2009  . LAPAROSCOPIC ENDOMETRIOSIS FULGURATION    . OPEN REDUCTION INTERNAL FIXATION (ORIF) FOOT LISFRANC FRACTURE Left 02/18/2016   Procedure: OPEN REDUCTION INTERNAL FIXATION (ORIF) FOOT LISFRANC FRACTURE;  Surgeon: Newt Minion, MD;  Location: [facility];  Service: Orthopedics;  Laterality: Left;  . TONSILLECTOMY    . ULNAR NERVE REPAIR    . WRIST SURGERY Right    torn cartilage     Family History  Problem Relation Age of Onset  . Cancer Mother   . Heart failure Father   . Breast cancer  Sister       Social History:  reports that she quit smoking about 2 years ago. She smoked 0.50 packs per day. She has never used smokeless tobacco. She reports current alcohol use. She reports that she does not use drugs.  Allergies  Allergen Reactions  . Other Nausea And Vomiting    general anesthesia  . Adhesive [Tape] Itching and Rash    Please use "paper" tape    MEDICATIONS:                                                                                                                     Scheduled: . enoxaparin (LOVENOX) injection  40 mg Subcutaneous Q24H   Continuous: . sodium chloride 100 mL/hr at 01/05/19 0300  . magnesium sulfate 1 - 4 g bolus IVPB    . potassium chloride 10 mEq (01/05/19 1043)   UEK:[id number], polyvinyl alcohol   ROS:                                                                                                                                        unobtainable from patient due to mental status   Blood pressure 131/81, pulse 92, temperature 98.2 F (36.8 C), temperature source Oral, resp. rate 16, SpO2 95 %.   General Examination:                                                                                                       Physical Exam  HEENT-  Normocephalic, no lesions, without obvious abnormality.  Normal external eye and conjunctiva.   Cardiovascular- S1-S2 audible, pulses palpable  throughout   Lungs-no rhonchi or wheezing noted, no excessive working breathing.  Saturations within normal limits on RA Extremities- Warm, dry and intact Skin-warm and dry, intact  Neurological Examination Mental Status: Awake, alert, does not follow any commands. Nonverbal. At the time of my exam patient was grabbing and releasing hand towels. Per family she either does that repetitive motion or  rolls her ankles and feet continuously. She will look in the direction of whoever is talking. Neck is not stiff or rigid. Grimaces to pain. Closed her eyes to threat. At time of attending exam, the patient was repetitively stroking a teddy bear with her right hand, with eyes closed.  Cranial Nerves: Actively resist eye opening, EOMI, Hearing intact, face is symmetric. Nonverbal.  Motor/ Sensory: Withdrew BLE to pain. grimace to pain and attempted to withdraw to pain on LUE. Grimaced to pain on RUE.  Tone and bulk:normal tone throughout; no atrophy noted  Plantars: Right: downgoing   Left: downgoing Cerebellar: Unable to assess Gait: Unable to assess   Lab Results: Basic Metabolic Panel: Recent Labs  Lab 01/03/19 1805 01/05/19 0410  NA 138 136  K 4.0 2.8*  CL 99 102  CO2 29 22  GLUCOSE 123* 77  BUN 9 5*  CREATININE 0.48 0.52  CALCIUM 9.1 8.6*  MG  --  1.6*    CBC: Recent Labs  Lab 01/03/19 1805 01/05/19 0410  WBC 5.5 8.8  HGB 11.0* 12.0  HCT 35.9* 36.6  MCV 96.2 88.8  PLT 242 294    Cardiac Enzymes: Recent Labs  Lab 01/03/19 1942 01/04/19 1034  CKTOTAL 1,123* 654*    Potassium: 2.8 Ammonia: 18 CK: 654 ( down from 2 days ago) TSH: 0.201 UDS: negative Drug screen 10: pending  Imaging: Ct Head Wo Contrast  Result Date: 01/03/2019 CLINICAL DATA:  Altered level of consciousness. No reported injury. EXAM: CT HEAD WITHOUT CONTRAST TECHNIQUE: Contiguous axial images were obtained from the base of the skull through the vertex without intravenous contrast. COMPARISON:   12/30/2013 brain MRI. FINDINGS: Brain: Tiny portion of the skull base inadvertently excluded from the scan. No evidence of parenchymal hemorrhage or extra-axial fluid collection. No mass lesion, mass effect, or midline shift. No CT evidence of acute infarction. Cerebral volume is age appropriate. No ventriculomegaly. Vascular: No acute abnormality. Skull: No evidence of calvarial fracture. Sinuses/Orbits: The visualized paranasal sinuses are essentially clear. Other:  The mastoid air cells are unopacified. IMPRESSION: Negative head CT. No evidence of acute intracranial abnormality. Electronically Signed   By: Ilona Sorrel M.D.   On: 01/03/2019 18:50   Dg Chest Portable 1 View  Result Date: 01/03/2019 CLINICAL DATA:  Altered mental status EXAM: PORTABLE CHEST 1 VIEW COMPARISON:  Chest radiograph August 30, 2016 and chest CT December 09, 2018 FINDINGS: There is no edema or consolidation. Heart size and pulmonary vascularity are normal. No adenopathy. No bone lesions. Thoracic stimulator lead tips are in the midthoracic region. IMPRESSION: No edema or consolidation. Electronically Signed   By: Lowella Grip III M.D.   On: 01/03/2019 18:58    Assessment: QUINITA KOSTELECKY is an 43 y.o. female PMH significant for ADD, anxiety, depression, insomnia, HTN, and chronic pain  (on methadone) who presented to [facility] ED on 01/03/2019 for AMS.  1. 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 No focal findings on exam.  2. EEG was with generalized slowing. Suspect possible mild encephalopathy secondary to polypharmacy.  3. No neck rigidity, afebrile, WBC: WNL , MRI brain: pending 4. An agitated catatonic state is also possible. Ambien has been used successfully to treat some cases of catatonia.  5. Spinal cord stimulator.  6. Chronic pain    Recommendations: -MRI brain. Will need assistance of the rep for the  company that manufactures/services her spinal cord stimulator, or  a staff member from the Neurosurgery clinic that placed the stimulator -Continue to hold all sedating medication -Psychology consult to assess for possible recent life stressors.  -Overall does not appear consistent with serotonin syndrome -Trial of 1-2 doses of Ambien, 5 mg PO.   Laurey Morale, MSN, NP-C [facility] [phone]  I have seen and examined the patient. I have formulate the assessment and plan. 43 year old female presenting with verbal unresponsiveness, repetitive speech at home and stereotyped motor behaviour during admission. Exam nonfocal. 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 MRI brain pending.  Electronically signed: Dr. Kerney Elbe 01/05/2019, 11:10 AM

## 2019-01-05 NOTE — Progress Notes (Addendum)
PROGRESS NOTE    Danielle Harrington  TIR:[id number] DOB: [birth year] DOA: 01/03/2019 PCP: Donald Prose, MD     Brief Narrative:  Danielle Harrington is a 43 yo female with past medical history significant for ADD, anxiety, depression, insomnia, hypertension, chronic pain on methadone who presented to the hospital via EMS for evaluation of altered mental status.  When EMS arrived, patient was making statements that were random, unfocused. History gathered from physicians and mother and sister.  Apparently, mom was in contact with the patient on Thursday night.  At that time, patient seemed herself, did not complain of anything specific.  Friday morning, mother noticed that patient was not making any sense.  Per report, patient kept repeating "I don't know, I don't know." Very unclear what happened prior to hospitalization.  Case was discussed with neurologist overnight, recommended for EEG, trial Narcan.  Possible that her presentation is due to polypharmacy. She was given narcan which did not change her presenting symptom. Per family, patient's normal baseline state is completely functional, patient currently works, lives alone, completely independent of activities of daily living. Per family, no known history of illicit drug use, maybe occasional marijuana and previous vaping and only occasional/social wine use.   New events last 24 hours / Subjective: Today, she is not responsive to verbal stimuli. She awakens easily with physical stimuli when I lifted her hand. Per family at bedside, she finally got some sleep last night, but has basically been nonverbal since my exam yesterday. She does not attempt to answer any questions.   Assessment & Plan:   Principal Problem:   Acute encephalopathy Active Problems:   Rhabdomyolysis   Transaminitis   Anemia   Acute toxic/metabolic encephalopathy -Question if this is secondary to polypharmacy.  Patient is currently taking Xanax, Wellbutrin, Cymbalta,  methadone, naltrexone, Pamelor, Percocet, Lyrica, Ambien. She does vape per family, but no other known illicit drug use  -?Serotonin syndrome although patient does not have hyperthermia, hyperreflexia, diaphoresis, rigidity. She is not agitated or restless on examination -Ammonia level 18 -?Seizure.  EEG revealed general background slowing without epileptiform activity.  She does have mildly elevated CK -Does not seem infectious at this time, she has no rigidity with neck flexion, no fever, normal WBC -Initial rapid urine drug screen was negative, serum drug screen is pending -CT head without contrast, no evidence of acute intracranial abnormality -MRI brain pending today  -Narcan given without change in mentation  -No improvement in last 48 hours, continue to hold her home psych/pain meds. Consult neurology today   Low TSH and T4 -Not consistent with hypothyroidism.  Free T3 normal.   Hypokalemia -Replace, trend  Hypomagnesemia -Replace, trend    DVT prophylaxis: Lovenox Code Status: Full Family Communication: Mother and sister at bedside Disposition Plan: Pending improvement in mentation    Consultants:   Neurology   Procedures:   None   Antimicrobials:  Anti-infectives (From admission, onward)   None       Objective: Vitals:   01/04/19 1937 01/04/19 2336 01/05/19 0450 01/05/19 0807  BP: (!) 145/90 (!) 155/111 (!) 156/93 131/81  Pulse: 100 80 87 92  Resp: 20 16    Temp: 98.6 F (37 C) 98.6 F (37 C) 98.6 F (37 C) 98.2 F (36.8 C)  TempSrc: Oral Oral Oral Oral  SpO2: (!) 76% 97% (!) 89% 95%    Intake/Output Summary (Last 24 hours) at 01/05/2019 1032 Last data filed at 01/05/2019 0814 Gross per 24 hour  Intake  1834.47 ml  Output 2200 ml  Net -365.53 ml   There were no vitals filed for this visit.  Examination: General exam: Appears calm, awakens to physical stimuli  Respiratory system: Clear to auscultation. Respiratory effort normal. Cardiovascular  system: S1 & S2 heard, RRR. No JVD, murmurs, rubs, gallops or clicks. No pedal edema. Gastrointestinal system: Abdomen is nondistended, soft and nontender. No organomegaly or masses felt. Normal bowel sounds heard. Central nervous system: Alert to physical stimuli, does not answer questions  Extremities: Symmetric  Skin: No rashes, lesions or ulcers    Data Reviewed: I have personally reviewed following labs and imaging studies  CBC: Recent Labs  Lab 01/03/19 1805 01/05/19 0410  WBC 5.5 8.8  HGB 11.0* 12.0  HCT 35.9* 36.6  MCV 96.2 88.8  PLT 242 619   Basic Metabolic Panel: Recent Labs  Lab 01/03/19 1805 01/05/19 0410  NA 138 136  K 4.0 2.8*  CL 99 102  CO2 29 22  GLUCOSE 123* 77  BUN 9 5*  CREATININE 0.48 0.52  CALCIUM 9.1 8.6*  MG  --  1.6*   GFR: CrCl cannot be calculated (Unknown ideal weight.). Liver Function Tests: Recent Labs  Lab 01/03/19 1805 01/04/19 1034  AST 68* 44*  ALT 54* 43  ALKPHOS 80 62  BILITOT 0.6 0.7  PROT 6.8 5.8*  ALBUMIN 3.5 2.9*   No results for input(s): LIPASE, AMYLASE in the last 168 hours. Recent Labs  Lab 01/04/19 1034  AMMONIA 18   Coagulation Profile: Recent Labs  Lab 01/03/19 1805  INR 0.9   Cardiac Enzymes: Recent Labs  Lab 01/03/19 1942 01/04/19 1034  CKTOTAL 1,123* 654*   BNP (last 3 results) No results for input(s): PROBNP in the last 8760 hours. HbA1C: No results for input(s): HGBA1C in the last 72 hours. CBG: Recent Labs  Lab 01/03/19 1824  GLUCAP 123*   Lipid Profile: No results for input(s): CHOL, HDL, LDLCALC, TRIG, CHOLHDL, LDLDIRECT in the last 72 hours. Thyroid Function Tests: Recent Labs    01/03/19 2219 01/04/19 0340 01/04/19 0906  TSH 0.201*  --   --   FREET4  --  0.76*  --   T3FREE  --   --  2.1   Anemia Panel: Recent Labs    01/04/19 1034  FERRITIN 49  TIBC 322  IRON 45   Sepsis Labs: No results for input(s): PROCALCITON, LATICACIDVEN in the last 168 hours.  No  results found for this or any previous visit (from the past 240 hour(s)).     Radiology Studies: Ct Head Wo Contrast  Result Date: 01/03/2019 CLINICAL DATA:  Altered level of consciousness. No reported injury. EXAM: CT HEAD WITHOUT CONTRAST TECHNIQUE: Contiguous axial images were obtained from the base of the skull through the vertex without intravenous contrast. COMPARISON:  12/30/2013 brain MRI. FINDINGS: Brain: Tiny portion of the skull base inadvertently excluded from the scan. No evidence of parenchymal hemorrhage or extra-axial fluid collection. No mass lesion, mass effect, or midline shift. No CT evidence of acute infarction. Cerebral volume is age appropriate. No ventriculomegaly. Vascular: No acute abnormality. Skull: No evidence of calvarial fracture. Sinuses/Orbits: The visualized paranasal sinuses are essentially clear. Other:  The mastoid air cells are unopacified. IMPRESSION: Negative head CT. No evidence of acute intracranial abnormality. Electronically Signed   By: Ilona Sorrel M.D.   On: 01/03/2019 18:50   Dg Chest Portable 1 View  Result Date: 01/03/2019 CLINICAL DATA:  Altered mental status EXAM: PORTABLE CHEST 1  VIEW COMPARISON:  Chest radiograph August 30, 2016 and chest CT December 09, 2018 FINDINGS: There is no edema or consolidation. Heart size and pulmonary vascularity are normal. No adenopathy. No bone lesions. Thoracic stimulator lead tips are in the midthoracic region. IMPRESSION: No edema or consolidation. Electronically Signed   By: Lowella Grip III M.D.   On: 01/03/2019 18:58      Scheduled Meds:  enoxaparin (LOVENOX) injection  40 mg Subcutaneous Q24H   Continuous Infusions:  sodium chloride 100 mL/hr at 01/05/19 0300   magnesium sulfate 1 - 4 g bolus IVPB     potassium chloride 10 mEq (01/05/19 0826)     LOS: 1 day    Time spent: 35 minutes   Dessa Phi, DO [facility] [email or web address] 01/05/2019, 10:32 AM

## 2019-01-06 DIAGNOSIS — R4182 Altered mental status, unspecified: Secondary | ICD-10-CM

## 2019-01-06 LAB — BASIC METABOLIC PANEL
Anion gap: 10 (ref 5–15)
BUN: 10 mg/dL (ref 6–20)
CO2: 19 mmol/L — AB (ref 22–32)
Calcium: 8.4 mg/dL — ABNORMAL LOW (ref 8.9–10.3)
Chloride: 106 mmol/L (ref 98–111)
Creatinine, Ser: 0.61 mg/dL (ref 0.44–1.00)
GFR calc Af Amer: >60 mL/min (ref >60)
GFR calc non Af Amer: >60 mL/min (ref >60)
Glucose, Bld: 91 mg/dL (ref 70–99)
Potassium: 3.2 mmol/L — ABNORMAL LOW (ref 3.5–5.1)
Sodium: 135 mmol/L (ref 135–145)

## 2019-01-06 LAB — MAGNESIUM: Magnesium: 2.1 mg/dL (ref 1.7–2.4)

## 2019-01-06 MED ORDER — POTASSIUM CHLORIDE 10 MEQ/100ML IV SOLN
10.0000 meq | INTRAVENOUS | Status: AC
  Administered 2019-01-06 (×4): 10 meq via INTRAVENOUS
  Filled 2019-01-06 (×4): qty 100

## 2019-01-06 MED ORDER — OLANZAPINE 5 MG PO TBDP
5.0000 mg | ORAL_TABLET | Freq: Every day | ORAL | Status: DC
  Administered 2019-01-06: 5 mg via ORAL
  Filled 2019-01-06 (×2): qty 1

## 2019-01-06 MED ORDER — LORAZEPAM 2 MG/ML IJ SOLN
2.0000 mg | Freq: Once | INTRAMUSCULAR | Status: AC
  Administered 2019-01-06: 2 mg via INTRAVENOUS
  Filled 2019-01-06: qty 1

## 2019-01-06 MED ORDER — HALOPERIDOL LACTATE 5 MG/ML IJ SOLN
INTRAMUSCULAR | Status: AC
  Administered 2019-01-06: 5 mg via INTRAVENOUS
  Filled 2019-01-06: qty 1

## 2019-01-06 MED ORDER — OLANZAPINE 2.5 MG PO TABS: 5.0000 mg | ORAL_TABLET | Freq: Every day | ORAL | Status: DC

## 2019-01-06 MED ORDER — HALOPERIDOL LACTATE 5 MG/ML IJ SOLN
5.0000 mg | Freq: Four times a day (QID) | INTRAMUSCULAR | Status: DC | PRN
  Administered 2019-01-06: 5 mg via INTRAVENOUS

## 2019-01-06 NOTE — Progress Notes (Signed)
PROGRESS NOTE    Danielle Harrington  JGO:[id number] DOB: 11-Jul-1976 DOA: 01/03/2019 PCP: Donald Prose, MD     Brief Narrative:  Danielle Harrington is a 43 yo female with past medical history significant for ADD, anxiety, depression, insomnia, hypertension, chronic pain on methadone who presented to the hospital via EMS for evaluation of altered mental status.  When EMS arrived, patient was making statements that were random, unfocused. History gathered from physicians and mother and sister.  Apparently, mom was in contact with the patient on Thursday night.  At that time, patient seemed herself, did not complain of anything specific.  Friday morning, mother noticed that patient was not making any sense.  Per report, patient kept repeating "I don't know, I don't know." Very unclear what happened prior to hospitalization.  Case was discussed with neurologist overnight, recommended for EEG, trial Narcan.  Possible that her presentation is due to polypharmacy. She was given narcan which did not change her presenting symptom. Per family, patient's normal baseline state is completely functional, patient currently works, lives alone, completely independent of activities of daily living. Per family, no known history of illicit drug use, maybe occasional marijuana and previous vaping and only occasional/social wine use.   New events last 24 hours / Subjective: She remains nonverbal, she is alert and tracks with her eyes. She has movement of bilateral upper extremities, moving her right arm in circular motion and left fingers in a tapping motion. She does withdraw to pain. Does not follow commands, does not answer questions. During my conversation with patient's mom at bedside, patient briefly scratched her head with her right hand.   Mom states that patient has been under significant stress at work in the past week.  Apparently, patient was worried about getting coronavirus and being quarantined at home after a  coworker reported a fever. Patient had believed that she was going to be fired from work (although mom says a [employer] refuted this claim).   Assessment & Plan:   Principal Problem:   Acute encephalopathy Active Problems:   Rhabdomyolysis   Transaminitis   Anemia   Acute toxic/metabolic encephalopathy -Question if this is secondary to polypharmacy.  Patient is currently taking Xanax, Wellbutrin, Cymbalta, methadone, naltrexone, Pamelor, Percocet, Lyrica, Ambien. She does vape per family, but no other known illicit drug use  -Ammonia level 18 -EEG revealed general background slowing without epileptiform activity.  She does have mildly elevated CK -Initial rapid urine drug screen was negative, serum drug screen is pending -CT head without contrast, no evidence of acute intracranial abnormality -MRI brain negative  -Narcan given without change in mentation  -Neurology consulted, believed to be psychogenic in nature -Psych consulted   Low TSH and T4 -Not consistent with hypothyroidism.  Free T3 normal.  -Repeat labs as outpatient in 4-6 weeks   Hypokalemia -Replace, trend    DVT prophylaxis: Lovenox Code Status: Full Family Communication: Mother at bedside Disposition Plan: Pending improvement in mentation    Consultants:   Neurology   Psych   Procedures:   None   Antimicrobials:  Anti-infectives (From admission, onward)   None       Objective: Vitals:   01/05/19 2041 01/05/19 2346 01/06/19 0436 01/06/19 0900  BP: (!) 157/98 (!) 150/97 (!) 145/92 (!) 145/96  Pulse: 92 (!) 108 96 94  Resp: 18  18 18   Temp: 98.2 F (36.8 C) 98.2 F (36.8 C) (!) 97.4 F (36.3 C) 97.7 F (36.5 C)  TempSrc: Oral Axillary  Axillary Axillary  SpO2: 96% 99% 98% 100%    Intake/Output Summary (Last 24 hours) at 01/06/2019 1111 Last data filed at 01/06/2019 0620 Gross per 24 hour  Intake 719.91 ml  Output 1200 ml  Net -480.09 ml   There were no vitals filed for this visit.   Examination: General exam: Appears calm and comfortable, alert but nonverbal  Respiratory system: Clear to auscultation. Respiratory effort normal. Cardiovascular system: S1 & S2 heard, RRR. No JVD, murmurs, rubs, gallops or clicks. No pedal edema. Gastrointestinal system: Abdomen is nondistended, soft and nontender. No organomegaly or masses felt. Normal bowel sounds heard. Central nervous system: Alert, moves all extremities spontaneously, does not answer questions or follow command  Extremities: Symmetric  Skin: No rashes, lesions or ulcers   Data Reviewed: I have personally reviewed following labs and imaging studies  CBC: Recent Labs  Lab 01/03/19 1805 01/05/19 0410  WBC 5.5 8.8  HGB 11.0* 12.0  HCT 35.9* 36.6  MCV 96.2 88.8  PLT 242 409   Basic Metabolic Panel: Recent Labs  Lab 01/03/19 1805 01/05/19 0410 01/06/19 0636  NA 138 136 135  K 4.0 2.8* 3.2*  CL 99 102 106  CO2 29 22 19*  GLUCOSE 123* 77 91  BUN 9 5* 10  CREATININE 0.48 0.52 0.61  CALCIUM 9.1 8.6* 8.4*  MG  --  1.6* 2.1   GFR: CrCl cannot be calculated (Unknown ideal weight.). Liver Function Tests: Recent Labs  Lab 01/03/19 1805 01/04/19 1034  AST 68* 44*  ALT 54* 43  ALKPHOS 80 62  BILITOT 0.6 0.7  PROT 6.8 5.8*  ALBUMIN 3.5 2.9*   No results for input(s): LIPASE, AMYLASE in the last 168 hours. Recent Labs  Lab 01/04/19 1034  AMMONIA 18   Coagulation Profile: Recent Labs  Lab 01/03/19 1805  INR 0.9   Cardiac Enzymes: Recent Labs  Lab 01/03/19 1942 01/04/19 1034  CKTOTAL 1,123* 654*   BNP (last 3 results) No results for input(s): PROBNP in the last 8760 hours. HbA1C: No results for input(s): HGBA1C in the last 72 hours. CBG: Recent Labs  Lab 01/03/19 1824  GLUCAP 123*   Lipid Profile: No results for input(s): CHOL, HDL, LDLCALC, TRIG, CHOLHDL, LDLDIRECT in the last 72 hours. Thyroid Function Tests: Recent Labs    01/03/19 2219 01/04/19 0340 01/04/19 0906  TSH  0.201*  --   --   FREET4  --  0.76*  --   T3FREE  --   --  2.1   Anemia Panel: Recent Labs    01/04/19 1034  FERRITIN 49  TIBC 322  IRON 45   Sepsis Labs: No results for input(s): PROCALCITON, LATICACIDVEN in the last 168 hours.  No results found for this or any previous visit (from the past 240 hour(s)).     Radiology Studies: Mr Jeri Cos WJ Contrast  Result Date: 01/05/2019 CLINICAL DATA:  43 year old female with encephalopathy. Altered mental status. EXAM: MRI HEAD WITHOUT AND WITH CONTRAST TECHNIQUE: Multiplanar, multiecho pulse sequences of the brain and surrounding structures were obtained without and with intravenous contrast. CONTRAST:  9 milliliters Gadavist COMPARISON:  Head CT 01/03/2019.  Brain MRI 12/30/2013. FINDINGS: Brain: No restricted diffusion to suggest acute infarction. No midline shift, mass effect, evidence of mass lesion, ventriculomegaly, extra-axial collection or acute intracranial hemorrhage. Cervicomedullary junction and pituitary are within normal limits. Pearline Cables and white matter signal appears to remain normal throughout the brain as on the 2015 comparison. No chronic cerebral blood products or  encephalomalacia identified. No abnormal enhancement identified. No dural thickening. Vascular: Major intracranial vascular flow voids are stable since 2015. The major dural venous sinuses are enhancing and appear to be patent. Skull and upper cervical spine: Negative visible cervical spine. Visualized bone marrow signal is within normal limits. Sinuses/Orbits: Stable and negative. Other: Mild bilateral mastoid effusions have substantially regressed since 2015. Other visible internal auditory structures appear normal. Scalp and face soft tissues appear negative. IMPRESSION: 1. Stable since 2015 and normal MRI appearance of the brain. 2. Substantially regressed bilateral mastoid effusions, significance doubtful. Electronically Signed   By: Genevie Ann M.D.   On: 01/05/2019 16:25       Scheduled Meds: . enoxaparin (LOVENOX) injection  40 mg Subcutaneous Q24H   Continuous Infusions: . sodium chloride 100 mL/hr at 01/05/19 0300     LOS: 2 days    Time spent: 30 minutes   Dessa Phi, DO [facility] [email or web address] 01/06/2019, 11:11 AM

## 2019-01-06 NOTE — Progress Notes (Addendum)
NEUROLOGY PROGRESS NOTE  Subjective: Patient currently laying in the bed.  She has her right arm up in the air and twisting it around in a writhing fashion.  No jerking motions.  When hand placed on arm she opens her eyes but still is mute.  When I placed my hand on her right arm she starts moving her left arm.  Will not answer questions.  Exam: Vitals:   01/06/19 0436 01/06/19 0900  BP: (!) 145/92 (!) 145/96  Pulse: 96 94  Resp: 18 18  Temp: (!) 97.4 F (36.3 C) 97.7 F (36.5 C)  SpO2: 98% 100%    Physical Exam   HEENT-  Normocephalic, no lesions, without obvious abnormality.  Normal external eye and conjunctiva.   Extremities- Warm, dry and intact Musculoskeletal-no joint tenderness, deformity or swelling Skin-warm and dry, no hyperpigmentation, vitiligo, or suspicious lesions    Neuro:  Mental Status: Patient currently is mute, opens eyes intermittently when talking, follows no commands. Cranial Nerves: II: Blinks to threat III,IV, VI: When eyes are open she does follow practitioner from side to side of the room, when attempting to look into her eyes she clenches her eyes extremely tight. V,VII: Face symmetrical, winces to noxious stimuli  IX,X: Patient would not open mouth/clenches mouth tight when attempting to look into her mouth XI: Shoulder strength within normal limits XII: Patient would not open mouth Motor: Withdrawing from noxious stimuli with 5/5 strength Sensory: Withdraws from noxious stimuli in all 4 quadrants  Deep Tendon Reflexes: 2+ and symmetric throughout except ankle jerk Plantars: Right: downgoing   Left: downgoing     Medications:  Scheduled: . enoxaparin (LOVENOX) injection  40 mg Subcutaneous Q24H    Pertinent Labs/Diagnostics: None  Mr Jeri Cos Wo Contrast  Result Date: 01/05/2019 CLINICAL DATA:  43 year old female with encephalopathy. Altered mental status. EXAM: MRI HEAD IMPRESSION: 1. Stable since 2015 and normal MRI appearance of  the brain. 2. Substantially regressed bilateral mastoid effusions, significance doubtful. Electronically Signed   By: Genevie Ann M.D.   On: 01/05/2019 16:25   EEG: IMPRESSION: This is an abnormal EEG secondary to general background slowing.  This finding may be seen with a diffuse disturbance that is etiologically nonspecific, but may include a metabolic encephalopathy, among other possibilities.  No epileptiform activity was noted.   Assessment: -At this point patient's overall presentation still appears to be most likely to be psychogenic with differential including malingering, factitious disorder and possible conversion disorder.  Exam is nonfocal.  EEG is within normal limits and MRI does not show any abnormalities.   Recommendations: -Continue to hold all sedating medications -Psychology consult- still waiting for this consult  Etta Quill PA-C [facility] [phone]  01/06/2019, 9:34 AM  I have seen the patient and reviewed the above note.  I agree that her presentation seems most likely to be psychogenic.  Of note, her divorce was finalized earlier this month and then she was apparently fixated and obsessed with the coronavirus convinced that she was going to die for a couple weeks prior to having decreased levels of responsiveness.  The abnormal movements that she is exhibiting are complex behaviors such as pumping her arms synchronously(e.g. as would a runner).   I agree with psychiatric consultation.  Roland Rack, MD [facility] [phone]  If 7pm- 7am, please page neurology on call as listed in [place].
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
## 2019-01-07 ENCOUNTER — Inpatient Hospital Stay (HOSPITAL_COMMUNITY)
Admission: AD | Admit: 2019-01-07 | Discharge: 2019-01-09 | DRG: 885 | Disposition: A | Discharge status: Home / Self Care | Payer: BLUE CROSS/BLUE SHIELD | Source: Intra-hospital | Attending: Psychiatry | Admitting: Psychiatry

## 2019-01-07 ENCOUNTER — Other Ambulatory Visit: Payer: Self-pay | Admitting: Registered Nurse

## 2019-01-07 ENCOUNTER — Other Ambulatory Visit: Payer: Self-pay

## 2019-01-07 ENCOUNTER — Encounter (HOSPITAL_COMMUNITY): Payer: Self-pay | Admitting: *Deleted

## 2019-01-07 DIAGNOSIS — M1711 Unilateral primary osteoarthritis, right knee: Secondary | ICD-10-CM | POA: Diagnosis present

## 2019-01-07 DIAGNOSIS — G5772 Causalgia of left lower limb: Secondary | ICD-10-CM | POA: Diagnosis not present

## 2019-01-07 DIAGNOSIS — F13231 Sedative, hypnotic or anxiolytic dependence with withdrawal delirium: Secondary | ICD-10-CM | POA: Diagnosis not present

## 2019-01-07 DIAGNOSIS — Z8249 Family history of ischemic heart disease and other diseases of the circulatory system: Secondary | ICD-10-CM | POA: Diagnosis not present

## 2019-01-07 DIAGNOSIS — Z87442 Personal history of urinary calculi: Secondary | ICD-10-CM | POA: Diagnosis not present

## 2019-01-07 DIAGNOSIS — G4700 Insomnia, unspecified: Secondary | ICD-10-CM | POA: Diagnosis not present

## 2019-01-07 DIAGNOSIS — R4182 Altered mental status, unspecified: Secondary | ICD-10-CM | POA: Diagnosis not present

## 2019-01-07 DIAGNOSIS — F322 Major depressive disorder, single episode, severe without psychotic features: Secondary | ICD-10-CM | POA: Diagnosis not present

## 2019-01-07 DIAGNOSIS — Z91048 Other nonmedicinal substance allergy status: Secondary | ICD-10-CM | POA: Diagnosis not present

## 2019-01-07 DIAGNOSIS — F1120 Opioid dependence, uncomplicated: Secondary | ICD-10-CM | POA: Diagnosis not present

## 2019-01-07 DIAGNOSIS — F13239 Sedative, hypnotic or anxiolytic dependence with withdrawal, unspecified: Secondary | ICD-10-CM | POA: Diagnosis not present

## 2019-01-07 DIAGNOSIS — I10 Essential (primary) hypertension: Secondary | ICD-10-CM | POA: Diagnosis present

## 2019-01-07 DIAGNOSIS — G9340 Encephalopathy, unspecified: Secondary | ICD-10-CM | POA: Diagnosis not present

## 2019-01-07 DIAGNOSIS — Z884 Allergy status to anesthetic agent status: Secondary | ICD-10-CM

## 2019-01-07 DIAGNOSIS — F13931 Sedative, hypnotic or anxiolytic use, unspecified with withdrawal delirium: Secondary | ICD-10-CM

## 2019-01-07 DIAGNOSIS — Z803 Family history of malignant neoplasm of breast: Secondary | ICD-10-CM

## 2019-01-07 LAB — BASIC METABOLIC PANEL
ANION GAP: 12 (ref 5–15)
Anion gap: 9 (ref 5–15)
BUN: 8 mg/dL (ref 6–20)
BUN: 9 mg/dL (ref 6–20)
CALCIUM: 8.6 mg/dL — AB (ref 8.9–10.3)
CO2: 19 mmol/L — ABNORMAL LOW (ref 22–32)
CO2: 20 mmol/L — ABNORMAL LOW (ref 22–32)
Calcium: 9 mg/dL (ref 8.9–10.3)
Chloride: 109 mmol/L (ref 98–111)
Chloride: 109 mmol/L (ref 98–111)
Creatinine, Ser: 0.55 mg/dL (ref 0.44–1.00)
Creatinine, Ser: 0.65 mg/dL (ref 0.44–1.00)
GFR calc Af Amer: >60 mL/min (ref >60)
GFR calc non Af Amer: >60 mL/min (ref >60)
GFR calc non Af Amer: >60 mL/min (ref >60)
Glucose, Bld: 90 mg/dL (ref 70–99)
Glucose, Bld: 131 mg/dL — ABNORMAL HIGH (ref 70–99)
Potassium: 2.8 mmol/L — ABNORMAL LOW (ref 3.5–5.1)
Potassium: 2.9 mmol/L — ABNORMAL LOW (ref 3.5–5.1)
Sodium: 140 mmol/L (ref 135–145)
Sodium: 138 mmol/L (ref 135–145)

## 2019-01-07 LAB — MAGNESIUM: Magnesium: 2 mg/dL (ref 1.7–2.4)

## 2019-01-07 MED ORDER — POTASSIUM CHLORIDE CRYS ER 20 MEQ PO TBCR
40.0000 meq | EXTENDED_RELEASE_TABLET | Freq: Once | ORAL | Status: AC
  Administered 2019-01-07: 40 meq via ORAL
  Filled 2019-01-07: qty 2

## 2019-01-07 MED ORDER — TRAZODONE HCL 50 MG PO TABS
50.0000 mg | ORAL_TABLET | Freq: Every evening | ORAL | Status: DC | PRN
  Filled 2019-01-07 (×8): qty 1

## 2019-01-07 MED ORDER — OLANZAPINE 5 MG PO TBDP: 5.0000 mg | ORAL_TABLET | Freq: Every day | ORAL | 0 refills | Status: DC

## 2019-01-07 MED ORDER — POTASSIUM CHLORIDE 10 MEQ/100ML IV SOLN
10.0000 meq | INTRAVENOUS | Status: DC
  Administered 2019-01-07 (×3): 10 meq via INTRAVENOUS
  Filled 2019-01-07 (×6): qty 100

## 2019-01-07 MED ORDER — ACETAMINOPHEN 325 MG PO TABS: 650.0000 mg | ORAL_TABLET | Freq: Four times a day (QID) | ORAL | Status: DC | PRN

## 2019-01-07 MED ORDER — HYDROXYZINE HCL 25 MG PO TABS: 25.0000 mg | ORAL_TABLET | Freq: Four times a day (QID) | ORAL | Status: DC | PRN

## 2019-01-07 MED ORDER — LORAZEPAM 1 MG PO TABS: 1.0000 mg | ORAL_TABLET | ORAL | Status: DC | PRN

## 2019-01-07 MED ORDER — ZIPRASIDONE MESYLATE 20 MG IM SOLR: 20.0000 mg | INTRAMUSCULAR | Status: DC | PRN

## 2019-01-07 MED ORDER — OLANZAPINE 5 MG PO TBDP: 5.0000 mg | ORAL_TABLET | Freq: Three times a day (TID) | ORAL | Status: DC | PRN

## 2019-01-07 NOTE — Progress Notes (Deleted)
NEUROLOGY PROGRESS NOTE  Subjective: Patient is completely back to normal.  Mother is in the bedroom.  Exam: Vitals:   01/07/19 0002 01/07/19 0416  BP: 137/77 (!) 154/93  Pulse: 98 92  Resp: 20 20  Temp: 100.1 F (37.8 C) 99 F (37.2 C)  SpO2: 96% 96%    Physical Exam   HEENT-  Normocephalic, no lesions, without obvious abnormality.  Normal external eye and conjunctiva.   Extremities- Warm, dry and intact Musculoskeletal-no joint tenderness, deformity or swelling Skin-warm and dry, no hyperpigmentation, vitiligo, or suspicious lesions    Neuro:  Mental Status: Alert, oriented, thought content appropriate.  Speech fluent without evidence of aphasia.  Able to follow 3 step commands without difficulty. Cranial Nerves: II:  Visual fields grossly normal,  III,IV, VI: ptosis not present, extra-ocular motions intact bilaterally pupils equal, round, reactive to light and accommodation V,VII: smile symmetric, facial light touch sensation normal bilaterally VIII: hearing normal bilaterally IX,X: uvula rises midline XI: bilateral shoulder shrug XII: midline tongue extension Motor: Right : Upper extremity   5/5    Left:     Upper extremity   5/5  Lower extremity   5/5     Lower extremity   5/5 Tone and bulk:normal tone throughout; no atrophy noted Sensory: Pinprick and light touch intact throughout, bilaterally Deep Tendon Reflexes: 2+ and symmetric throughout Plantars: Right: downgoing   Left: downgoing Cerebellar: normal finger-to-nose, normal rapid alternating movements and normal heel-to-shin test Gait: normal gait and station    Medications:  Scheduled: . enoxaparin (LOVENOX) injection  40 mg Subcutaneous Q24H  . OLANZapine zydis  5 mg Oral QHS    Pertinent Labs/Diagnostics: -Potassium 2.8  Mr Jeri Cos Wo Contrast  Result Date: 01/05/2019 CLINICAL DATA:  43 year old female with encephalopathy. Altered mental status. EXAM: MRI HEAD WITHOUT AND WITH CONTRAST  TECHNIQUE: Multiplanar, multiecho pulse sequences of the brain and surrounding structures were obtained without and with intravenous contrast. CONTRAST:  9 milliliters Gadavist COMPARISON:  Head CT 01/03/2019.  Brain MRI 12/30/2013. FINDINGS: Brain: No restricted diffusion to suggest acute infarction. No midline shift, mass effect, evidence of mass lesion, ventriculomegaly, extra-axial collection or acute intracranial hemorrhage. Cervicomedullary junction and pituitary are within normal limits. Pearline Cables and white matter signal appears to remain normal throughout the brain as on the 2015 comparison. No chronic cerebral blood products or encephalomalacia identified. No abnormal enhancement identified. No dural thickening. Vascular: Major intracranial vascular flow voids are stable since 2015. The major dural venous sinuses are enhancing and appear to be patent. Skull and upper cervical spine: Negative visible cervical spine. Visualized bone marrow signal is within normal limits. Sinuses/Orbits: Stable and negative. Other: Mild bilateral mastoid effusions have substantially regressed since 2015. Other visible internal auditory structures appear normal. Scalp and face soft tissues appear negative. IMPRESSION: 1. Stable since 2015 and normal MRI appearance of the brain. 2. Substantially regressed bilateral mastoid effusions, significance doubtful. Electronically Signed   By: Genevie Ann M.D.   On: 01/05/2019 16:25     Etta Quill PA-C [facility] [phone]   Assessment: -At this time patient is fully resolved which furthermore makes diagnosis likely psychogenic.  EEG is within normal limits and MRI does not show any abnormalities.  Psych is seen the patient and agrees that her actions are most likely psychogenic and has recommended inpatient psychiatric hospitalization given high risk of harm to self.  Recommendations: At this point neurology has no further recommendations.  Please call with any  questions.  01/07/2019, 10:28 AM

## 2019-01-07 NOTE — Progress Notes (Signed)
D: Pt denies SI/HI/AVH. Pt is pleasant and cooperative. Pt has been seen on the unit earlier this evening, pt forwarded minimal information at this time.   A: Pt was offered support and encouragement. Pt was given scheduled medications. Pt was encourage to attend groups. Q 15 minute checks were done for safety.   R: safety maintained on unit.  Problem: Safety: Goal: Ability to remain free from injury will improve Outcome: Progressing

## 2019-01-07 NOTE — TOC Transition Note (Addendum)
Transition of Care [facility]) - CM/SW Discharge Note   Patient Details  Name: Danielle Harrington MRN: [id number] Date of Birth: 1976/07/31  Transition of Care [facility] CM/SW Contact:  Geralynn Ochs, LCSW Phone Number: 01/07/2019, 12:09 PM   Clinical Narrative:   Patient going to room 504, bed 2. Accepting physician Dr. Jake Samples. Transport set for 4:00 PM.  Nurse to call report to [phone] BEFORE PATIENT IS PICKED UP FOR TRANSPORT.    Final next level of care: Psychiatric Hospital Barriers to Discharge: No Barriers Identified   Patient Goals and CMS Choice Patient states their goals for this hospitalization and ongoing recovery are:: get back home to my son      Discharge Placement                Patient to be transferred to facility by: [facility] Name of family member notified: Self, mother at bedside Patient and family notified of of transfer: 01/07/19  Discharge Plan and Services                        Social Determinants of Health ([place]) Interventions     Readmission Risk Interventions No flowsheet data found.

## 2019-01-07 NOTE — Plan of Care (Signed)
Adequate for discharge.

## 2019-01-07 NOTE — Discharge Summary (Signed)
Physician Discharge Summary  Danielle Harrington QBH:[id number] DOB: Jul 22, 1976 DOA: 01/03/2019  PCP: Donald Prose, MD  Admit date: 01/03/2019 Discharge date: 01/07/2019  Admitted From: Home Disposition:  Inpatient psych   Recommendations for Outpatient Follow-up:  1. Please obtain BMP to check potassium level. Hypokalemia was replaced with PO and IV potassium prior to discharge  2. Repeat TSH as outpatient in 4-6 weeks   Discharge Condition: Stable, improved CODE STATUS: Full  Diet recommendation: Regular diet   Brief/Interim Summary: Danielle Harrington is a 43 yo female with past medical history significant for ADD, anxiety, depression, insomnia, hypertension, chronic pain on methadone who presented to the hospital via EMS for evaluation of altered mental status. When EMS arrived, patient was making statements that were random, unfocused. History gathered from physicians and mother and sister.  Apparently, mom was in contact with the patient on Thursday night.  At that time, patient seemed herself, did not complain of anything specific.  Friday morning, mother noticed that patient was not making any sense.  Per report, patient kept repeating "I don't know, I don't know." Very unclear what happened prior to hospitalization.  Case was discussed with neurologist overnight, recommended for EEG, trial Narcan.  Possible that her presentation is due to polypharmacy. She was given narcan which did not change her presenting symptom. Per family, patient's normal baseline state is completely functional, patient currently works, lives alone, completely independent of activities of daily living. Per family, no known history of illicit drug use, maybe occasional marijuana and previous vaping and only occasional/social wine use.  MRI brain was unremarkable.  EEG negative for seizure activity.  Neurology was consulted, they felt that her presentation was psychogenic in nature.  Psychiatry consulted, recommended  inpatient psych placement.  Discharge Diagnoses:  Principal Problem:   Altered mental status Active Problems:   Acute encephalopathy   Rhabdomyolysis   Transaminitis   Anemia  Acute toxic/metabolic encephalopathy -Initially thought her presentation was secondary to polypharmacy.  Patient is currently taking Xanax, Wellbutrin, Cymbalta, methadone, naltrexone, Pamelor, Percocet, Lyrica, Ambien. She does vape per family, but no other known illicit drug use.  Medications were stopped at time of admission. -Ammonia level 18 -EEG revealed general background slowing without epileptiform activity.  She does have mildly elevated CK -Initial rapid urine drug screen was negative, serum drug screen is pending -CT head without contrast, no evidence of acute intracranial abnormality -MRI brain negative  -Narcan given without change in mentation  -Neurology consulted, believed to be psychogenic in nature -Psych consulted, recommended one-time dose of IV Ativan, Zyprexa nightly.  Recommending inpatient psych placement  Low TSH and T4 -Not consistent with hypothyroidism.  Free T3 normal.  -Repeat labs as outpatient in 4-6 weeks   Hypokalemia -Replace, trend    Discharge Instructions  Discharge Instructions    Diet general   Complete by:  As directed    Increase activity slowly   Complete by:  As directed      Allergies as of 01/07/2019      Reactions   Other Nausea And Vomiting   general anesthesia   Adhesive [tape] Itching, Rash   Please use "paper" tape      Medication List    STOP taking these medications   ALPRAZolam 0.5 MG tablet Commonly known as:  XANAX   buPROPion 300 MG 24 hr tablet Commonly known as:  WELLBUTRIN XL   DULoxetine 60 MG capsule Commonly known as:  CYMBALTA   ibuprofen 200 MG tablet Commonly  known as:  ADVIL,MOTRIN   NALTREXONE HCL PO   nortriptyline 25 MG capsule Commonly known as:  PAMELOR   pregabalin 300 MG capsule Commonly known as:   LYRICA   zolpidem 10 MG tablet Commonly known as:  AMBIEN     TAKE these medications   folic acid 1 MG tablet Commonly known as:  FOLVITE Take 1 mg by mouth at bedtime.   levocetirizine 5 MG tablet Commonly known as:  XYZAL Take 5 mg by mouth at bedtime.   losartan 100 MG tablet Commonly known as:  COZAAR Take 100 mg by mouth daily.   methadone 5 MG tablet Commonly known as:  DOLOPHINE Take 5 mg by mouth 5 (five) times daily.   nystatin-triamcinolone ointment Commonly known as:  MYCOLOG Apply 1 application topically 2 (two) times daily.   OLANZapine zydis 5 MG disintegrating tablet Commonly known as:  ZYPREXA Take 1 tablet (5 mg total) by mouth at bedtime.   oxyCODONE-acetaminophen 5-325 MG tablet Commonly known as:  PERCOCET/ROXICET Take 1 tablet by mouth every 6 (six) hours as needed for moderate pain or severe pain.   vitamin C 500 MG tablet Commonly known as:  ASCORBIC ACID Take 1,000 mg by mouth every evening.      Follow-up Information    Donald Prose, MD Follow up.   Specialty:  Family Medicine Contact information: [place] [address] [phone]          Allergies  Allergen Reactions  . Other Nausea And Vomiting    general anesthesia  . Adhesive [Tape] Itching and Rash    Please use "paper" tape    Consultations:  Neurology  Psychiatry    Procedures/Studies: Ct Head Wo Contrast  Result Date: 01/03/2019 CLINICAL DATA:  Altered level of consciousness. No reported injury. EXAM: CT HEAD WITHOUT CONTRAST TECHNIQUE: Contiguous axial images were obtained from the base of the skull through the vertex without intravenous contrast. COMPARISON:  12/30/2013 brain MRI. FINDINGS: Brain: Tiny portion of the skull base inadvertently excluded from the scan. No evidence of parenchymal hemorrhage or extra-axial fluid collection. No mass lesion, mass effect, or midline shift. No CT evidence of acute infarction. Cerebral volume  is age appropriate. No ventriculomegaly. Vascular: No acute abnormality. Skull: No evidence of calvarial fracture. Sinuses/Orbits: The visualized paranasal sinuses are essentially clear. Other:  The mastoid air cells are unopacified. IMPRESSION: Negative head CT. No evidence of acute intracranial abnormality. Electronically Signed   By: Ilona Sorrel M.D.   On: 01/03/2019 18:50   Mr Jeri Cos BJ Contrast  Result Date: 01/05/2019 CLINICAL DATA:  43 year old female with encephalopathy. Altered mental status. EXAM: MRI HEAD WITHOUT AND WITH CONTRAST TECHNIQUE: Multiplanar, multiecho pulse sequences of the brain and surrounding structures were obtained without and with intravenous contrast. CONTRAST:  9 milliliters Gadavist COMPARISON:  Head CT 01/03/2019.  Brain MRI 12/30/2013. FINDINGS: Brain: No restricted diffusion to suggest acute infarction. No midline shift, mass effect, evidence of mass lesion, ventriculomegaly, extra-axial collection or acute intracranial hemorrhage. Cervicomedullary junction and pituitary are within normal limits. Pearline Cables and white matter signal appears to remain normal throughout the brain as on the 2015 comparison. No chronic cerebral blood products or encephalomalacia identified. No abnormal enhancement identified. No dural thickening. Vascular: Major intracranial vascular flow voids are stable since 2015. The major dural venous sinuses are enhancing and appear to be patent. Skull and upper cervical spine: Negative visible cervical spine. Visualized bone marrow signal is within normal limits. Sinuses/Orbits:  Stable and negative. Other: Mild bilateral mastoid effusions have substantially regressed since 2015. Other visible internal auditory structures appear normal. Scalp and face soft tissues appear negative. IMPRESSION: 1. Stable since 2015 and normal MRI appearance of the brain. 2. Substantially regressed bilateral mastoid effusions, significance doubtful. Electronically Signed   By: Genevie Ann M.D.   On: 01/05/2019 16:25   Ct Chest High Resolution  Result Date: 12/09/2018 CLINICAL DATA:  43 year old female with suspected interstitial lung disease. Ground-glass attenuation noted in the lungs on prior examination. No current cough or fever. EXAM: CT CHEST WITHOUT CONTRAST TECHNIQUE: Multidetector CT imaging of the chest was performed following the standard protocol without intravenous contrast. High resolution imaging of the lungs, as well as inspiratory and expiratory imaging, was performed. COMPARISON:  Chest CT 08/26/2018. FINDINGS: Cardiovascular: Heart size is normal. There is no significant pericardial fluid, thickening or pericardial calcification. No atherosclerotic calcifications are noted in the thoracic aorta or the coronary arteries. Mediastinum/Nodes: No pathologically enlarged mediastinal or hilar lymph nodes. Please note that accurate exclusion of hilar adenopathy is limited on noncontrast CT scans. Esophagus is unremarkable in appearance. No axillary lymphadenopathy. Lungs/Pleura: When compared to prior studies the patchy multifocal ground-glass attenuation scattered throughout the lungs bilaterally has nearly completely resolved. The only residual areas are very subtle regions, now evident in the lung bases (previously in the mid to upper lungs). No associated septal thickening, subpleural reticulation, parenchymal banding, traction bronchiectasis or frank honeycombing. Inspiratory and expiratory imaging demonstrates some very mild air trapping indicative of mild small airways disease. No acute consolidative airspace disease. No pleural effusions. No suspicious appearing pulmonary nodules or masses are noted. Upper Abdomen: Unremarkable. Musculoskeletal: Spinal cord stimulator in the lower thoracic region incidentally noted. There are no aggressive appearing lytic or blastic lesions noted in the visualized portions of the skeleton. IMPRESSION: 1. Near complete resolution of patchy  ground-glass attenuation seen on prior studies, now evident only in the lung bases. Findings are highly unusual, and not compatible with an interstitial lung disease. Given the migratory nature of the findings, this does not conform to typical disease patterns, and is completely indeterminate. 2. Mild air trapping indicative of mild small airways disease. Electronically Signed   By: Vinnie Langton M.D.   On: 12/09/2018 20:19   Dg Chest Portable 1 View  Result Date: 01/03/2019 CLINICAL DATA:  Altered mental status EXAM: PORTABLE CHEST 1 VIEW COMPARISON:  Chest radiograph August 30, 2016 and chest CT December 09, 2018 FINDINGS: There is no edema or consolidation. Heart size and pulmonary vascularity are normal. No adenopathy. No bone lesions. Thoracic stimulator lead tips are in the midthoracic region. IMPRESSION: No edema or consolidation. Electronically Signed   By: Lowella Grip III M.D.   On: 01/03/2019 18:58   EEG 01/04/2019  IMPRESSION: This is an abnormal EEG secondary to general background slowing.  This finding may be seen with a diffuse disturbance that is etiologically nonspecific, but may include a metabolic encephalopathy, among other possibilities.  No epileptiform activity was noted.     Discharge Exam: Vitals:   01/07/19 0416 01/07/19 1133  BP: (!) 154/93 (!) 146/108  Pulse: 92 99  Resp: 20 20  Temp: 99 F (37.2 C) 98.3 F (36.8 C)  SpO2: 96% 97%    General: Pt is alert, awake, not in acute distress Cardiovascular: RRR, S1/S2 +, no rubs, no gallops Respiratory: CTA bilaterally, no wheezing, no rhonchi Abdominal: Soft, NT, ND, bowel sounds + Extremities: no edema, no cyanosis  The results of significant diagnostics from this hospitalization (including imaging, microbiology, ancillary and laboratory) are listed below for reference.     Microbiology: No results found for this or any previous visit (from the past 240 hour(s)).   Labs: BNP (last 3 results) No  results for input(s): BNP in the last 8760 hours. Basic Metabolic Panel: Recent Labs  Lab 01/03/19 1805 01/05/19 0410 01/06/19 0636 01/07/19 0404  NA 138 136 135 140  K 4.0 2.8* 3.2* 2.8*  CL 99 102 106 109  CO2 29 22 19* 19*  GLUCOSE 123* 77 91 90  BUN 9 5* 10 8  CREATININE 0.48 0.52 0.61 0.55  CALCIUM 9.1 8.6* 8.4* 9.0  MG  --  1.6* 2.1 2.0   Liver Function Tests: Recent Labs  Lab 01/03/19 1805 01/04/19 1034  AST 68* 44*  ALT 54* 43  ALKPHOS 80 62  BILITOT 0.6 0.7  PROT 6.8 5.8*  ALBUMIN 3.5 2.9*   No results for input(s): LIPASE, AMYLASE in the last 168 hours. Recent Labs  Lab 01/04/19 1034  AMMONIA 18   CBC: Recent Labs  Lab 01/03/19 1805 01/05/19 0410  WBC 5.5 8.8  HGB 11.0* 12.0  HCT 35.9* 36.6  MCV 96.2 88.8  PLT 242 294   Cardiac Enzymes: Recent Labs  Lab 01/03/19 1942 01/04/19 1034  CKTOTAL 1,123* 654*   BNP: Invalid input(s): POCBNP CBG: Recent Labs  Lab 01/03/19 1824  GLUCAP 123*   D-Dimer No results for input(s): DDIMER in the last 72 hours. Hgb A1c No results for input(s): HGBA1C in the last 72 hours. Lipid Profile No results for input(s): CHOL, HDL, LDLCALC, TRIG, CHOLHDL, LDLDIRECT in the last 72 hours. Thyroid function studies No results for input(s): TSH, T4TOTAL, T3FREE, THYROIDAB in the last 72 hours.  Invalid input(s): FREET3 Anemia work up No results for input(s): VITAMINB12, FOLATE, FERRITIN, TIBC, IRON, RETICCTPCT in the last 72 hours. Urinalysis    Component Value Date/Time   COLORURINE YELLOW 01/03/2019 2052   APPEARANCEUR CLEAR 01/03/2019 2052   LABSPEC 1.016 01/03/2019 2052   PHURINE 7.0 01/03/2019 2052   GLUCOSEU NEGATIVE 01/03/2019 2052   HGBUR NEGATIVE 01/03/2019 2052   BILIRUBINUR NEGATIVE 01/03/2019 2052   KETONESUR 80 (A) 01/03/2019 2052   PROTEINUR NEGATIVE 01/03/2019 2052   UROBILINOGEN 0.2 05/08/2018 1901   NITRITE NEGATIVE 01/03/2019 2052   LEUKOCYTESUR NEGATIVE 01/03/2019 2052   Sepsis  Labs Invalid input(s): PROCALCITONIN,  WBC,  LACTICIDVEN Microbiology No results found for this or any previous visit (from the past 240 hour(s)).   Patient was seen and examined on the day of discharge and was found to be in stable condition. Time coordinating discharge: 35 minutes including assessment and coordination of care, as well as examination of the patient.   SIGNED:  Dessa Phi, DO [facility] [email or web address] 01/07/2019, 12:00 PM

## 2019-01-07 NOTE — Progress Notes (Addendum)
PROGRESS NOTE    Danielle Harrington  MWU:[id number] DOB: Nov 27, 1975 DOA: 01/03/2019 PCP: Donald Prose, MD     Brief Narrative:  Danielle Harrington is a 43 yo female with past medical history significant for ADD, anxiety, depression, insomnia, hypertension, chronic pain on methadone who presented to the hospital via EMS for evaluation of altered mental status.  When EMS arrived, patient was making statements that were random, unfocused. History gathered from physicians and mother and sister.  Apparently, mom was in contact with the patient on Thursday night.  At that time, patient seemed herself, did not complain of anything specific.  Friday morning, mother noticed that patient was not making any sense.  Per report, patient kept repeating "I don't know, I don't know." Very unclear what happened prior to hospitalization.  Case was discussed with neurologist overnight, recommended for EEG, trial Narcan.  Possible that her presentation is due to polypharmacy. She was given narcan which did not change her presenting symptom. Per family, patient's normal baseline state is completely functional, patient currently works, lives alone, completely independent of activities of daily living. Per family, no known history of illicit drug use, maybe occasional marijuana and previous vaping and only occasional/social wine use.  MRI brain was unremarkable.  EEG negative for seizure activity.  Neurology was consulted, they felt that her presentation was psychogenic in nature.  Psychiatry consulted, recommended inpatient psych placement.  New events last 24 hours / Subjective: Mother at bedside states that patient has improved, although not back to her baseline.  Yesterday, she was alert and was able to eat and drink.  This morning, patient has had market improvement in her mentation.  She is alert, oriented x3, appears very close to her baseline.  Denies any physical complaints.   Assessment & Plan:   Principal Problem:  Altered mental status Active Problems:   Acute encephalopathy   Rhabdomyolysis   Transaminitis   Anemia   Acute toxic/metabolic encephalopathy -Initially thought her presentation was secondary to polypharmacy.  Patient is currently taking Xanax, Wellbutrin, Cymbalta, methadone, naltrexone, Pamelor, Percocet, Lyrica, Ambien. She does vape per family, but no other known illicit drug use.  Medications were stopped at time of admission. -Ammonia level 18 -EEG revealed general background slowing without epileptiform activity.  She does have mildly elevated CK -Initial rapid urine drug screen was negative, serum drug screen is pending -CT head without contrast, no evidence of acute intracranial abnormality -MRI brain negative  -Narcan given without change in mentation  -Neurology consulted, believed to be psychogenic in nature -Psych consulted, recommended one-time dose of IV Ativan, Zyprexa nightly.  Recommending inpatient psych placement  Low TSH and T4 -Not consistent with hypothyroidism.  Free T3 normal.  -Repeat labs as outpatient in 4-6 weeks   Hypokalemia -Replace, trend    DVT prophylaxis: Lovenox Code Status: Full Family Communication: Mother at bedside Disposition Plan: Recommending inpatient psych placement.  Discussed with patient and mother at bedside.  Patient is voluntary.  She is medically stable for transfer to psych hospital when placement is found.   Consultants:   Neurology   Psych   Procedures:   None   Antimicrobials:  Anti-infectives (From admission, onward)   None       Objective: Vitals:   01/06/19 2002 01/06/19 2126 01/07/19 0002 01/07/19 0416  BP: (!) 150/103 (!) 156/87 137/77 (!) 154/93  Pulse: 98 (!) 107 98 92  Resp: 18  20 20   Temp: 97.8 F (36.6 C)  100.1 F (37.8  C) 99 F (37.2 C)  TempSrc: Oral  Oral Oral  SpO2: 97%  96% 96%    Intake/Output Summary (Last 24 hours) at 01/07/2019 0839 Last data filed at 01/06/2019 1700 Gross  per 24 hour  Intake 480 ml  Output 800 ml  Net -320 ml   There were no vitals filed for this visit.  Examination: General exam: Appears calm and comfortable  Respiratory system: Clear to auscultation. Respiratory effort normal. Cardiovascular system: S1 & S2 heard, RRR. No JVD, murmurs, rubs, gallops or clicks. No pedal edema. Gastrointestinal system: Abdomen is nondistended, soft and nontender. No organomegaly or masses felt. Normal bowel sounds heard. Central nervous system: Alert and oriented x3. No focal neurological deficits.  Speech is clear, appropriate. Extremities: Symmetric  Skin: No rashes, lesions or ulcers Psychiatry: Judgement and insight appear normal. Mood & affect appropriate.    Data Reviewed: I have personally reviewed following labs and imaging studies  CBC: Recent Labs  Lab 01/03/19 1805 01/05/19 0410  WBC 5.5 8.8  HGB 11.0* 12.0  HCT 35.9* 36.6  MCV 96.2 88.8  PLT 242 371   Basic Metabolic Panel: Recent Labs  Lab 01/03/19 1805 01/05/19 0410 01/06/19 0636 01/07/19 0404  NA 138 136 135 140  K 4.0 2.8* 3.2* 2.8*  CL 99 102 106 109  CO2 29 22 19* 19*  GLUCOSE 123* 77 91 90  BUN 9 5* 10 8  CREATININE 0.48 0.52 0.61 0.55  CALCIUM 9.1 8.6* 8.4* 9.0  MG  --  1.6* 2.1 2.0   GFR: CrCl cannot be calculated (Unknown ideal weight.). Liver Function Tests: Recent Labs  Lab 01/03/19 1805 01/04/19 1034  AST 68* 44*  ALT 54* 43  ALKPHOS 80 62  BILITOT 0.6 0.7  PROT 6.8 5.8*  ALBUMIN 3.5 2.9*   No results for input(s): LIPASE, AMYLASE in the last 168 hours. Recent Labs  Lab 01/04/19 1034  AMMONIA 18   Coagulation Profile: Recent Labs  Lab 01/03/19 1805  INR 0.9   Cardiac Enzymes: Recent Labs  Lab 01/03/19 1942 01/04/19 1034  CKTOTAL 1,123* 654*   BNP (last 3 results) No results for input(s): PROBNP in the last 8760 hours. HbA1C: No results for input(s): HGBA1C in the last 72 hours. CBG: Recent Labs  Lab 01/03/19 1824  GLUCAP  123*   Lipid Profile: No results for input(s): CHOL, HDL, LDLCALC, TRIG, CHOLHDL, LDLDIRECT in the last 72 hours. Thyroid Function Tests: Recent Labs    01/04/19 0906  T3FREE 2.1   Anemia Panel: Recent Labs    01/04/19 1034  FERRITIN 49  TIBC 322  IRON 45   Sepsis Labs: No results for input(s): PROCALCITON, LATICACIDVEN in the last 168 hours.  No results found for this or any previous visit (from the past 240 hour(s)).     Radiology Studies: Mr Jeri Cos GG Contrast  Result Date: 01/05/2019 CLINICAL DATA:  43 year old female with encephalopathy. Altered mental status. EXAM: MRI HEAD WITHOUT AND WITH CONTRAST TECHNIQUE: Multiplanar, multiecho pulse sequences of the brain and surrounding structures were obtained without and with intravenous contrast. CONTRAST:  9 milliliters Gadavist COMPARISON:  Head CT 01/03/2019.  Brain MRI 12/30/2013. FINDINGS: Brain: No restricted diffusion to suggest acute infarction. No midline shift, mass effect, evidence of mass lesion, ventriculomegaly, extra-axial collection or acute intracranial hemorrhage. Cervicomedullary junction and pituitary are within normal limits. Pearline Cables and white matter signal appears to remain normal throughout the brain as on the 2015 comparison. No chronic cerebral blood products or  encephalomalacia identified. No abnormal enhancement identified. No dural thickening. Vascular: Major intracranial vascular flow voids are stable since 2015. The major dural venous sinuses are enhancing and appear to be patent. Skull and upper cervical spine: Negative visible cervical spine. Visualized bone marrow signal is within normal limits. Sinuses/Orbits: Stable and negative. Other: Mild bilateral mastoid effusions have substantially regressed since 2015. Other visible internal auditory structures appear normal. Scalp and face soft tissues appear negative. IMPRESSION: 1. Stable since 2015 and normal MRI appearance of the brain. 2. Substantially  regressed bilateral mastoid effusions, significance doubtful. Electronically Signed   By: Genevie Ann M.D.   On: 01/05/2019 16:25      Scheduled Meds: . enoxaparin (LOVENOX) injection  40 mg Subcutaneous Q24H  . OLANZapine zydis  5 mg Oral QHS   Continuous Infusions: . sodium chloride 100 mL/hr at 01/06/19 2346  . potassium chloride 10 mEq (01/07/19 0828)     LOS: 3 days    Time spent: 25 minutes   Dessa Phi, DO [facility] [email or web address] 01/07/2019, 8:39 AM

## 2019-01-07 NOTE — Progress Notes (Signed)
NEUROLOGY PROGRESS NOTE  Subjective: Patient is completely back to normal.  Mother is in the bedroom.  Exam: Vitals:   01/07/19 0002 01/07/19 0416  BP: 137/77 (!) 154/93  Pulse: 98 92  Resp: 20 20  Temp: 100.1 F (37.8 C) 99 F (37.2 C)  SpO2: 96% 96%    Physical Exam   HEENT-  Normocephalic, no lesions, without obvious abnormality.  Normal external eye and conjunctiva.   Extremities- Warm, dry and intact Musculoskeletal-no joint tenderness, deformity or swelling Skin-warm and dry, no hyperpigmentation, vitiligo, or suspicious lesions    Neuro:  Mental Status: Alert, oriented, thought content appropriate.  Speech fluent without evidence of aphasia.  Able to follow 3 step commands without difficulty. Cranial Nerves: II:  Visual fields grossly normal,  III,IV, VI: ptosis not present, extra-ocular motions intact bilaterally pupils equal, round, reactive to light and accommodation V,VII: smile symmetric, facial light touch sensation normal bilaterally VIII: hearing normal bilaterally IX,X: uvula rises midline XI: bilateral shoulder shrug XII: midline tongue extension Motor: Right : Upper extremity   5/5    Left:     Upper extremity   5/5  Lower extremity   5/5     Lower extremity   5/5 Tone and bulk:normal tone throughout; no atrophy noted Sensory: Pinprick and light touch intact throughout, bilaterally Deep Tendon Reflexes: 2+ and symmetric throughout Plantars: Right: downgoing   Left: downgoing Cerebellar: normal finger-to-nose, normal rapid alternating movements and normal heel-to-shin test Gait: normal gait and station    Medications:  Scheduled: . enoxaparin (LOVENOX) injection  40 mg Subcutaneous Q24H  . OLANZapine zydis  5 mg Oral QHS    Pertinent Labs/Diagnostics: -Potassium 2.8  Mr Jeri Cos Wo Contrast  Result Date: 01/05/2019 CLINICAL DATA:  43 year old female with encephalopathy. Altered mental status. EXAM: MRI HEAD WITHOUT AND WITH CONTRAST  TECHNIQUE: Multiplanar, multiecho pulse sequences of the brain and surrounding structures were obtained without and with intravenous contrast. CONTRAST:  9 milliliters Gadavist COMPARISON:  Head CT 01/03/2019.  Brain MRI 12/30/2013. FINDINGS: Brain: No restricted diffusion to suggest acute infarction. No midline shift, mass effect, evidence of mass lesion, ventriculomegaly, extra-axial collection or acute intracranial hemorrhage. Cervicomedullary junction and pituitary are within normal limits. Pearline Cables and white matter signal appears to remain normal throughout the brain as on the 2015 comparison. No chronic cerebral blood products or encephalomalacia identified. No abnormal enhancement identified. No dural thickening. Vascular: Major intracranial vascular flow voids are stable since 2015. The major dural venous sinuses are enhancing and appear to be patent. Skull and upper cervical spine: Negative visible cervical spine. Visualized bone marrow signal is within normal limits. Sinuses/Orbits: Stable and negative. Other: Mild bilateral mastoid effusions have substantially regressed since 2015. Other visible internal auditory structures appear normal. Scalp and face soft tissues appear negative. IMPRESSION: 1. Stable since 2015 and normal MRI appearance of the brain. 2. Substantially regressed bilateral mastoid effusions, significance doubtful. Electronically Signed   By: Genevie Ann M.D.   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  EEG is within normal limits and MRI does not show any abnormalities.  Psych is seen the patient and agrees that her actions are most likely psychogenic and has recommended inpatient psychiatric hospitalization given high risk of harm to self.  Recommendations: At this point neurology has no further recommendations.  Please call with any questions.  Etta Quill PA-C [facility]uro[facility]ist [phone]  01/07/2019, 10:28 AM

## 2019-01-07 NOTE — TOC Initial Note (Signed)
Transition of Care [facility]) - Initial/Assessment Note    Patient Details  Name: Danielle Harrington MRN: [id number] Date of Birth: 15-Apr-1976  Transition of Care [facility]) CM/SW Contact:    Geralynn Ochs, LCSW Phone Number: 01/07/2019, 11:17 AM  Clinical Narrative:  Recommended for inpatient psych, patient is voluntary. Referral called to [facility], they will hopefully have a bed for patient today. Patient to sign voluntary admission form, and need new set of vitals; patient with a heart rate of over 100 in the past 24 hours.                  Expected Discharge Plan: Psychiatric Hospital Barriers to Discharge: Psych Bed not available, Continued Medical Work up   Patient Goals and CMS Choice Patient states their goals for this hospitalization and ongoing recovery are:: get back home to my son      Expected Discharge Plan and Services Expected Discharge Plan: [facility]       Expected Discharge Date: (unknown)                        Prior Living Arrangements/Services   Lives with:: Self, Minor Children Patient language and need for interpreter reviewed:: No Do you feel safe going back to the place where you live?: Yes      Need for Family Participation in Patient Care: No (Comment) Care giver support system in place?: Yes (comment)   Criminal Activity/Legal Involvement Pertinent to Current Situation/Hospitalization: No - Comment as needed  Activities of Daily Living Home Assistive Devices/Equipment: Contact lenses ADL Screening (condition at time of admission) Patient's cognitive ability adequate to safely complete daily activities?: No Is the patient deaf or have difficulty hearing?: No Does the patient have difficulty seeing, even when wearing glasses/contacts?: No Does the patient have difficulty concentrating, remembering, or making decisions?: Yes Patient able to express need for assistance with ADLs?: No Does the patient have difficulty dressing or  bathing?: Yes Independently performs ADLs?: No Communication: Needs assistance Is this a change from baseline?: Pre-admission baseline Dressing (OT): Needs assistance Is this a change from baseline?: Pre-admission baseline Grooming: Needs assistance Is this a change from baseline?: Pre-admission baseline Feeding: Needs assistance Is this a change from baseline?: Pre-admission baseline Bathing: Needs assistance Is this a change from baseline?: Pre-admission baseline Toileting: Needs assistance Is this a change from baseline?: Pre-admission baseline In/Out Bed: Needs assistance Is this a change from baseline?: Pre-admission baseline Walks in Home: Dependent Is this a change from baseline?: Pre-admission baseline Does the patient have difficulty walking or climbing stairs?: Yes Weakness of Legs: Both Weakness of Arms/Hands: Both  Permission Sought/Granted Permission sought to share information with : Chartered certified accountant granted to share information with : Yes, Verbal Permission Granted     Permission granted to share info w AGENCY: [facility]        Emotional Assessment Appearance:: Appears stated age Attitude/Demeanor/Rapport: Engaged Affect (typically observed): Appropriate Orientation: : Oriented to Self, Oriented to Place, Oriented to  Time, Oriented to Situation Alcohol / Substance Use: Not Applicable Psych Involvement: Yes (comment)  Admission diagnosis:  Acute encephalopathy [G93.40] Non-traumatic rhabdomyolysis [M62.82] Patient Active Problem List   Diagnosis Date Noted  . Altered mental status   . Acute encephalopathy 01/03/2019  . Rhabdomyolysis 01/03/2019  . Transaminitis 01/03/2019  . Anemia 01/03/2019  . Complex regional pain syndrome type 1 of left lower extremity 10/09/2016  . Morton neuroma, left    PCP:  Donald Prose, MD Pharmacy:   [id number] [place] [id number]:  [phone]: [phone]    Social Determinants of Health (SDOH) Interventions    Readmission Risk Interventions 30 Day Unplanned Readmission Risk Score     [place] to H[facility]-Ad[facility] ([place] [facility] [place]  30 Day Unplanned Readmission Risk Score (%)  9 Filed at 01/07/2019 0801     This score is the patient's risk of an unplanned readmission within 30 days of being discharged (0 -100%). The score is based on dignosis, age, lab data, medications, orders, and past utilization.   Low:  0-14.9   Medium: 15-21.9   High: 22-29.9   Extreme: 30 and above       No flowsheet data found.

## 2019-01-07 NOTE — Progress Notes (Signed)
Danielle Harrington is a 43 year old female pt admitted on voluntary basis from medical floor. On admission, she presents as cooperative and lucid. She reports that she wasn't feeling well on Thursday and then fell out on Friday and she reports she wasn't feeling well and likened it to having the flu. She denies SI and is able to contract for safety while in the hospital. She reports that she was feeling stressed out and overwhelmed and spoke about how she had been having job and financial issues. She reports she has a PCP and pain management doctor and reports that she takes her medications as prescribed. She denies any substance abuse issues. She reports that she shares custody of her child with her husband but otherwise lives alone and will return to the same living situation upon discharge. [name] was cooperative during admission, she was oriented to the milieu and safety maintained.

## 2019-01-07 NOTE — Progress Notes (Signed)
Did not attend group 

## 2019-01-07 NOTE — Tx Team (Signed)
Initial Treatment Plan 01/07/2019 5:05 PM Danielle Harrington LZJ:[id number]    PATIENT STRESSORS: Financial difficulties Occupational concerns   PATIENT STRENGTHS: Ability for insight Average or above average intelligence Capable of independent living General fund of knowledge Motivation for treatment/growth   PATIENT IDENTIFIED PROBLEMS: Anxiety Psychosis "I was feeling overwhelmed" "I wasn't feeling quite right"                     DISCHARGE CRITERIA:  Ability to meet basic life and health needs Improved stabilization in mood, thinking, and/or behavior Verbal commitment to aftercare and medication compliance  PRELIMINARY DISCHARGE PLAN: Attend aftercare/continuing care group Return to previous living arrangement  PATIENT/FAMILY INVOLVEMENT: This treatment plan has been presented to and reviewed with the patient, Danielle Harrington, and/or family member, .  The patient and family have been given the opportunity to ask questions and make suggestions.  [place], [place], [place] 01/07/2019, 5:05 PM

## 2019-01-08 DIAGNOSIS — F322 Major depressive disorder, single episode, severe without psychotic features: Principal | ICD-10-CM

## 2019-01-08 DIAGNOSIS — F13931 Sedative, hypnotic or anxiolytic use, unspecified with withdrawal delirium: Secondary | ICD-10-CM

## 2019-01-08 DIAGNOSIS — F13231 Sedative, hypnotic or anxiolytic dependence with withdrawal delirium: Secondary | ICD-10-CM

## 2019-01-08 MED ORDER — ZOLPIDEM TARTRATE 5 MG PO TABS
10.0000 mg | ORAL_TABLET | Freq: Every day | ORAL | Status: DC
  Administered 2019-01-08: 10 mg via ORAL
  Filled 2019-01-08: qty 2

## 2019-01-08 MED ORDER — CLONAZEPAM 0.5 MG PO TABS
0.2500 mg | ORAL_TABLET | Freq: Two times a day (BID) | ORAL | Status: DC
  Administered 2019-01-08 – 2019-01-09 (×3): 0.25 mg via ORAL
  Filled 2019-01-08 (×2): qty 1

## 2019-01-08 MED ORDER — POTASSIUM CHLORIDE CRYS ER 20 MEQ PO TBCR
20.0000 meq | EXTENDED_RELEASE_TABLET | Freq: Two times a day (BID) | ORAL | Status: DC
  Administered 2019-01-08 – 2019-01-09 (×2): 20 meq via ORAL
  Filled 2019-01-08 (×7): qty 1

## 2019-01-08 MED ORDER — GABAPENTIN 300 MG PO CAPS
300.0000 mg | ORAL_CAPSULE | Freq: Three times a day (TID) | ORAL | Status: DC
  Administered 2019-01-08 – 2019-01-09 (×4): 300 mg via ORAL
  Filled 2019-01-08 (×10): qty 1

## 2019-01-08 MED ORDER — CLONIDINE HCL 0.1 MG PO TABS
0.1000 mg | ORAL_TABLET | Freq: Three times a day (TID) | ORAL | Status: DC
  Administered 2019-01-08 – 2019-01-09 (×4): 0.1 mg via ORAL
  Filled 2019-01-08 (×10): qty 1

## 2019-01-08 NOTE — BHH Group Notes (Signed)
Occupational Therapy Group Note  Date:  01/08/2019 Time:  2:36 PM  Group Topic/Focus:  Stress Mangement  Participation Level:  Active  Participation Quality:  Appropriate  Affect:  Blunted  Cognitive:  Appropriate  Insight: Improving  Engagement in Group:  Engaged  Modes of Intervention:  Activity, Discussion, Education and Socialization  Additional Comments:    S: "Music really helps me"  O: Education given on stress management and its implications in daily life. Pt asked to share current experiences then create an art activity to help showcase skills to be used.  A: Pt presents with blunted affect, engaged and participatory throughout entirety of session. Pt offering several stress management strategies and completing craft with cues for instructions, pt able to follow with no apparent difficulty. Noting increased affect.  P: OT Group will be x1 per week while pt inpatient.  Zenovia Jarred, MSOT, OTR/L Behavioral Health OT/ Acute Relief OT PHP Office: [place] 01/08/2019, 2:36 PM

## 2019-01-08 NOTE — Progress Notes (Signed)
Adult Psychoeducational Group Note  Date:  01/08/2019 Time:  8:43 PM  Group Topic/Focus:  Wrap-Up Group:   The focus of this group is to help patients review their daily goal of treatment and discuss progress on daily workbooks.  Participation Level:  Active  Participation Quality:  Appropriate  Affect:  Appropriate  Cognitive:  Appropriate  Insight: Appropriate  Engagement in Group:  Engaged  Modes of Intervention:  Discussion  Additional Comments:  The patient expressed that she attended groups   The patient also said that she rates today a 9.  [name] 01/08/2019, 8:43 PM
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
## 2019-01-08 NOTE — BHH Suicide Risk Assessment (Signed)
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  World Pharmacologist [facility]). Score between 0-7:  no or low risk or alcohol related problems. Score between 8-15:  moderate risk of alcohol related problems. Score between 16-19:  high risk of alcohol related problems. Score 20 or above:  warrants further diagnostic evaluation for alcohol dependence and treatment.   CLINICAL FACTORS:   Alcohol/Substance Abuse/Dependencies   COGNITIVE FEATURES THAT CONTRIBUTE TO RISK:  None    SUICIDE RISK:   Minimal: No identifiable suicidal ideation.  Patients presenting with no risk factors but with morbid ruminations; may be classified as minimal risk based on the severity of the depressive symptoms  PLAN OF CARE: Admitted to protect from protracted alprazolam withdrawal probable discharge within 48 hours due to improvement seen in emergency department  I certify that inpatient services furnished can reasonably be  expected to improve the patient's condition.   Johnn Hai, MD 01/08/2019, 9:37 AM

## 2019-01-08 NOTE — BHH Suicide Risk Assessment (Signed)
[place] INPATIENT:  Family/Significant Other Suicide Prevention Education  Suicide Prevention Education:  Education Completed; Everardo Beals, mother, [phone],  has been identified by the patient as the family member/significant other with whom the patient will be residing, and identified as the person(s) who will aid the patient in the event of a mental health crisis (suicidal ideations/suicide attempt).  With written consent from the patient, the family member/significant other has been provided the following suicide prevention education, prior to the and/or following the discharge of the patient.  The suicide prevention education provided includes the following:  Suicide risk factors  Suicide prevention and interventions  National Suicide Hotline telephone number  [facility] assessment telephone number  [facility] Emergency Assistance [place] and/or Residential Mobile Crisis Unit telephone number  Request made of family/significant other to:  Remove weapons (e.g., guns, rifles, knives), all items previously/currently identified as safety concern.  Pt does have a handgun for protection.   Remove drugs/medications (over-the-counter, prescriptions, illicit drugs), all items previously/currently identified as a safety concern.  The family member/significant other verbalizes understanding of the suicide prevention education information provided.  The family member/significant other agrees to remove the items of safety concern listed above.  Mother reports she talks to her daughter every day.  Mother shared the history of the current events and said that she spoke with pt today and "she is as sensible as she can be."  Mother had questions as to the cause of pt's altered mental status.  CSW hung up, spoke to pt and received permission from pt to share with mother that MD opinion is that pt was in withdrawal from xanax.  CSW called mother back and discussed this.   Mother reports that pt sister is in [place] and will stay here overnight with plan to pick up pt once she is discharged tomorrow.  Mother hoping pt will come stay with her for a few days.  Joanne Chars, LCSW 01/08/2019, 3:29 PM

## 2019-01-08 NOTE — Progress Notes (Signed)
Recreation Therapy Notes  INPATIENT RECREATION THERAPY ASSESSMENT  Patient Details Name: Danielle Harrington MRN: [id number] DOB: Nov 11, 1975 Today's Date: 01/08/2019       Information Obtained From: Patient  Able to Participate in Assessment/Interview: Yes  Patient Presentation: Alert  Reason for Admission (Per Patient): Other (Comments)(Pt stated she couldn't remember things, fell and hit her head and was being mean to her mother)  Patient Stressors: Work, Other (Comment)(Finances; Keeping up with life)  Coping Skills:   Sports, TV, Exercise, Music, Deep Breathing, Impulsivity, Talk, Prayer, Art, Avoidance, Read, Dance, Hot Bath/Shower  Leisure Interests (2+):  Individual - Reading, Music - Listen, Music - Other (Comment)(Dance to music)  Frequency of Recreation/Participation: Weekly  Awareness of Community Resources:  Yes  Community Resources:  Gym, Patent examiner  Current Use: Yes  If no, Barriers?:    Expressed Interest in [place]: No  [place] of Residence:  [place]  Patient Main Form of Transportation: Musician  Patient Strengths:  Listening and Reasoning  Patient Identified Areas of Improvement:  Listening and Reasoning  Patient Goal for Hospitalization:  "get better, take care of self better"  Current SI (including self-harm):  No  Current HI:  No  Current AVH: No  Staff Intervention Plan: Group Attendance, Collaborate with Interdisciplinary Treatment Team  Consent to Intern Participation: N/A    Victorino Sparrow, LRT/CTRS  Ria Comment, [place] 01/08/2019, 1:30 PM

## 2019-01-08 NOTE — Progress Notes (Signed)
Recreation Therapy Notes  Date: 3.18.20 Time: 1000 Location: 500 Hall Dayroom  Group Topic:  Goal Planning  Goal Area(s) Addresses:  Patient will be able to identify at least 3 life goals.  Patient will be able to identify obstacles that may hinder goals.  Patient will be able to identify what they need to achieve goals.   Intervention:  Worksheet, pencils  Activity:  Goal Planning.  Patients were to identify goals they wanted to accomplish in a week, month, year and five years.  Patients were to then identify obstacles they may face, what they need to achieve goals and what they can do tomorrow to work towards goals.  Education: Discharge Planning, Goal Setting  Education Outcome: Acknowledges Education/In Group Clarification Provided/Needs Additional Education  Clinical Observations:  Pt did not attend group.    Victorino Sparrow, LRT/CTRS        Victorino Sparrow A 01/08/2019 11:44 AM
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
## 2019-01-08 NOTE — H&P (Signed)
Psychiatric Admission Assessment Adult  Patient Identification: DARRIEL SINQUEFIELD MRN:  [id number] Date of Evaluation:  01/08/2019 Chief Complaint:  MDD OPIOID DEPENDENCE Principal Diagnosis: Disorganized and confusional state of acute onset Diagnosis:  Active Problems:   MDD (major depressive disorder), severe (HCC)   Benzodiazepine withdrawal with delirium ([place])  History of Present Illness:   This is the first psychiatric admission here for Ms. 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 Patient was in her normal state of health (with polypharmacy to include other agents such as bupropion, Pamelor, Ambien, Lyrica, Cymbalta, and olanzapine) ER was discovered at her home on 3/13 and confused state, making nonsensical statements, was brought to the emergency department by ambulance.  Confusional symptoms persisted during her protracted stay in the emergency department while she was worked up, and the EEG showed diffuse slowing, drug screen was negative for all compounds, CT scan of the head without contrast was normal-  At the present time the patient is alert and oriented to person place time day and situation she is cooperative with the interview process she believes she is closer to her baseline status but still has a little trouble collecting her thoughts, she denies auditory and visual hallucinations denies wanting to harm self or others and has a poor memory for recent events.  She does however acknowledge she went off her Xanax "a few days ago" and does not remember the last time she had Xanax she had been dependent upon at least 1.5 mg a day for approximately 5 years.  Therefore I think the most likely explanation for her acute confusion is that she went into alprazolam withdrawal, while still self administering the  polypharmacy that has been iatrogenic.  She accepts this explanation her blood pressure is a bit up and her pulse rate is up to 119 which would be consistent with protracted benzodiazepine withdrawal.     Associated Signs/Symptoms: Depression Symptoms:  fatigue, (Hypo) Manic Symptoms:  Distractibility, Anxiety Symptoms:  n/a Psychotic Symptoms:  Recent episode of disorganized thought and behavior PTSD Symptoms: NA Total Time spent with patient: 45 minutes  Past Psychiatric History: Treated for depression with the combination of meds most recently Cymbalta has been her primary antidepressant but there have been prescriptions for Wellbutrin and nortriptyline  Is the patient at risk to self? Yes.    Has the patient been a risk to self in the past 6 months? No.  Has the patient been a risk to self within the distant past? No.  Is the patient a risk to others? No.  Has the patient been a risk to others in the past 6 months? No.  Has the patient been a risk to others within the distant past? No.   Prior Inpatient Therapy:   Prior Outpatient Therapy:    Alcohol Screening: 1. How often do you have a drink containing alcohol?: Monthly or less 2. How many drinks containing alcohol do you have on a typical day when you are drinking?: 1 or 2 3. How often do you have six or more drinks on one occasion?: Never AUDIT-C Score: 1 4. How often during the last year have you found that you were not able to stop drinking once you had started?: Never 5. How often during the last year have you failed to do what was normally  expected from you becasue of drinking?: Never 6. How often during the last year have you needed a first drink in the morning to get yourself going after a heavy drinking session?: Never 7. How often during the last year have you had a feeling of guilt of remorse after drinking?: Never 8. How often during the last year have you been unable to remember what happened the night before  because you had been drinking?: Never 9. Have you or someone else been injured as a result of your drinking?: No 10. Has a relative or friend or a doctor or another health worker been concerned about your drinking or suggested you cut down?: No Alcohol Use Disorder Identification Test Final Score (AUDIT): 1 Alcohol Brief Interventions/Follow-up: AUDIT Score <7 follow-up not indicated Substance Abuse History in the last 12 months:  No. Consequences of Substance Abuse: NA Previous Psychotropic Medications: Yes  Psychological Evaluations: No  Past Medical History:  Past Medical History:  Diagnosis Date  . ADD (attention deficit disorder)   . Anxiety   . Arthritis    R knee, sciata treated - injection- 2012  . Complication of anesthesia    used scop. patch in the past  . Depression   . Endometriosis   . Family history of adverse reaction to anesthesia    N&V  . Headache    migraine, last one 3-4 months ago   . History of anemia   . History of bronchitis   . History of kidney stones   . Hypertension   . Insomnia    Ambien  . PONV (postoperative nausea and vomiting)     Past Surgical History:  Procedure Laterality Date  . APPENDECTOMY    . CESAREAN SECTION    . EXCISION MORTON'S NEUROMA Left 09/29/2016   Procedure: EXCISION MORTON'S NEUROMA LEFT FOOT 3RD WEB SPACE;  Surgeon: Newt Minion, MD;  Location: [facility];  Service: Orthopedics;  Laterality: Left;  . KNEE SURGERY Right 1995 & 2009   ACL repair & arthroscopy -2009  . LAPAROSCOPIC ENDOMETRIOSIS FULGURATION    . OPEN REDUCTION INTERNAL FIXATION (ORIF) FOOT LISFRANC FRACTURE Left 02/18/2016   Procedure: OPEN REDUCTION INTERNAL FIXATION (ORIF) FOOT LISFRANC FRACTURE;  Surgeon: Newt Minion, MD;  Location: [facility];  Service: Orthopedics;  Laterality: Left;  . TONSILLECTOMY    . ULNAR NERVE REPAIR    . WRIST SURGERY Right    torn cartilage    Family History:  Family History  Problem Relation Age of Onset  . Cancer Mother   .  Heart failure Father   . Breast cancer Sister    Family Psychiatric  History: ukn Tobacco Screening: Have you used any form of tobacco in the last 30 days? (Cigarettes, Smokeless Tobacco, Cigars, and/or Pipes): No Social History:  Social History   Substance and Sexual Activity  Alcohol Use Yes   Comment: socially- 1-2 drinks per week      Social History   Substance and Sexual Activity  Drug Use No    Additional Social History:                           Allergies:   Allergies  Allergen Reactions  . Other Nausea And Vomiting    general anesthesia  . Adhesive [Tape] Itching and Rash    Please use "paper" tape   Lab Results:  Results for orders placed or performed during the hospital encounter of 01/03/19 (from the past  48 hour(s))  Basic metabolic panel     Status: Abnormal   Collection Time: 01/07/19  4:04 AM  Result Value Ref Range   Sodium 140 135 - 145 mmol/L   Potassium 2.8 (L) 3.5 - 5.1 mmol/L   Chloride 109 98 - 111 mmol/L   CO2 19 (L) 22 - 32 mmol/L   Glucose, Bld 90 70 - 99 mg/dL   BUN 8 6 - 20 mg/dL   Creatinine, Ser 0.55 0.44 - 1.00 mg/dL   Calcium 9.0 8.9 - 10.3 mg/dL   GFR calc non Af Amer >60 >60 mL/min   GFR calc Af Amer >60 >60 mL/min   Anion gap 12 5 - 15    Comment: Performed at [facility], [place] [address] [place], [place] [address]  Magnesium     Status: None   Collection Time: 01/07/19  4:04 AM  Result Value Ref Range   Magnesium 2.0 1.7 - 2.4 mg/dL    Comment: Performed at [facility] [address] [place], [place] [address]  Basic metabolic panel     Status: Abnormal   Collection Time: 01/07/19 12:05 PM  Result Value Ref Range   Sodium 138 135 - 145 mmol/L   Potassium 2.9 (L) 3.5 - 5.1 mmol/L   Chloride 109 98 - 111 mmol/L   CO2 20 (L) 22 - 32 mmol/L   Glucose, Bld 131 (H) 70 - 99 mg/dL   BUN 9 6 - 20 mg/dL   Creatinine, Ser 0.65 0.44 - 1.00 mg/dL   Calcium 8.6 (L) 8.9 - 10.3 mg/dL   GFR calc non Af Amer >60  >60 mL/min   GFR calc Af Amer >60 >60 mL/min   Anion gap 9 5 - 15    Comment: Performed at [facility] [address], [place], [place] [address]    Blood Alcohol level:  Lab Results  Component Value Date   ETH (H) 03/26/2011    <11        LOWEST DETECTABLE LIMIT FOR SERUM ALCOHOL IS 5 mg/dL FOR MEDICAL PURPOSES ONLY    Metabolic Disorder Labs:  No results found for: HGBA1C, MPG No results found for: PROLACTIN No results found for: CHOL, TRIG, HDL, CHOLHDL, VLDL, LDLCALC  Current Medications: Current Facility-Administered Medications  Medication Dose Route Frequency Provider Last Rate Last Dose  . acetaminophen (TYLENOL) tablet 650 mg  650 mg Oral Q6H PRN Laverle Hobby, PA-C      . clonazePAM Bobbye [place]) tablet 0.25 mg  0.25 mg Oral BID Johnn Hai, MD      . cloNIDine (CATAPRES) tablet 0.1 mg  0.1 mg Oral TID Johnn Hai, MD      . gabapentin (NEURONTIN) capsule 300 mg  300 mg Oral TID Johnn Hai, MD      . hydrOXYzine (ATARAX/VISTARIL) tablet 25 mg  25 mg Oral Q6H PRN Laverle Hobby, PA-C      . OLANZapine zydis (ZYPREXA) disintegrating tablet 5 mg  5 mg Oral Q8H PRN Patriciaann Clan E, PA-C       And  . ziprasidone (GEODON) injection 20 mg  20 mg Intramuscular PRN Laverle Hobby, PA-C      . traZODone (DESYREL) tablet 50 mg  50 mg Oral QHS,MR X 1 Simon, Spencer E, PA-C      . zolpidem (AMBIEN) tablet 10 mg  10 mg Oral QHS Johnn Hai, MD       PTA Medications: Medications Prior to Admission  Medication Sig Dispense Refill Last Dose  . folic acid (FOLVITE) 1 MG tablet Take 1 mg by mouth at bedtime.   4 unknown  . levocetirizine (XYZAL) 5 MG tablet Take 5 mg by mouth at bedtime.   3 unknown  . losartan (COZAAR) 100 MG tablet Take 100 mg by mouth daily.  5 unknown  . methadone (DOLOPHINE) 5 MG tablet Take 5 mg by mouth 5 (five) times daily.    unknown  . nystatin-triamcinolone ointment (MYCOLOG) Apply 1 application topically 2 (two) times daily.    unknown   . OLANZapine zydis (ZYPREXA) 5 MG disintegrating tablet Take 1 tablet (5 mg total) by mouth at bedtime. 30 tablet 0   . oxyCODONE-acetaminophen (PERCOCET/ROXICET) 5-325 MG tablet Take 1 tablet by mouth every 6 (six) hours as needed for moderate pain or severe pain.    unknown  . vitamin C (ASCORBIC ACID) 500 MG tablet Take 1,000 mg by mouth every evening.   unknown    Musculoskeletal: Strength & Muscle Tone: within normal limits Gait & Station: normal Patient leans: N/A  Psychiatric Specialty Exam: Physical Exam  ROS  Blood pressure (!) 139/108, pulse (!) 119, temperature 98.4 F (36.9 C), temperature source Oral, resp. rate 18, height 5' 4"  (1.626 m), weight 87.5 kg.Body mass index is 33.13 kg/m.  General Appearance: Casual  Eye Contact:  Fair  Speech:  Normal Rate  Volume:  Decreased  Mood:  Anxious and Dysphoric  Affect:  Appropriate  Thought Process:  Goal Directed  Orientation:  Full (Time, Place, and Person)  Thought Content:  Logical  Suicidal Thoughts:  No  Homicidal Thoughts:  No  Memory:  Immediate;   Fair  Judgement:  Fair  Insight:  Fair  Psychomotor Activity:  Normal  Concentration:  Concentration: Fair  Recall:  [employer] of Knowledge:  Fair  Language:  Fair  Akathisia:  Negative  Handed:  Right  AIMS (if indicated):     Assets:  Communication Skills Desire for Improvement Financial Resources/Insurance Housing Leisure Time Physical Health Resilience Social Support Talents/Skills Transportation Vocational/Educational  ADL's:  Intact  Cognition:  WNL  Sleep:  Number of Hours: 6.75    Treatment Plan Summary: Daily contact with patient to assess and evaluate symptoms and progress in treatment, Medication management and Plan Mid for stabilization  Observation Level/Precautions:  15 minute checks  Laboratory:  UDS  Psychotherapy: Cognitive and rehab based  Medications: Main medication regimen is to protect from protracted alprazolam withdrawal   Consultations: None necessary  Discharge Concerns: Risk of protracted withdrawal and seizures  Estimated LOS: 3 days  Other: Axis I-psychosis most likely due to alprazolam withdrawal syndrome, in the context of polypharmacy opiate dependency/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 Axis II deferred Axis III complex regional pain syndrome and opiate dependency   Physician Treatment Plan for Primary Diagnosis: <principal problem not specified> Long Term Goal(s): Improvement in symptoms so as ready for discharge  Short Term Goals: Ability to disclose and discuss suicidal ideas  Physician Treatment Plan for Secondary Diagnosis: Active Problems:   MDD (major depressive disorder), severe (HCC)   Benzodiazepine withdrawal with delirium ([place])  Long Term Goal(s): Improvement in symptoms so as ready for discharge  Short Term Goals: Compliance with prescribed medications will improve  I certify that inpatient services furnished can reasonably be expected to improve the patient's condition.    Johnn Hai, MD 3/18/20209:38 AM

## 2019-01-08 NOTE — Progress Notes (Signed)
Patient has been attending groups engaged in unit activities and compliant with medications.  Patient denies SI, HI and AVH.    Assess patient for safety, offer medications as prescribed, engage patient in 1:1 staff talks.   Patient able to contract for safety. Continue to monitor as planned.

## 2019-01-08 NOTE — Progress Notes (Signed)
Tensed attended wrap up group. Pt appears animated/anxious in affect and mood. Pt denies SI/HI/AVH/Pain at this time. Ambien was started HS. Provider on call notified and order obtained for K+ supplement. Pt states she hopes to go home tomorrow. No new c/o's. Support provided. Will continue with POC.

## 2019-01-08 NOTE — Tx Team (Signed)
Interdisciplinary Treatment and Diagnostic Plan Update  01/08/2019 Time of Session: [place] MRN: [id number]  Principal Diagnosis: <principal problem not specified>  Secondary Diagnoses: Active Problems:   MDD (major depressive disorder), severe (HCC)   Benzodiazepine withdrawal with delirium ([place])   Current Medications:  Current Facility-Administered Medications  Medication Dose Route Frequency Provider Last Rate Last Dose  . acetaminophen (TYLENOL) tablet 650 mg  650 mg Oral Q6H PRN Laverle Hobby, PA-C      . clonazePAM Bobbye [place]) tablet 0.25 mg  0.25 mg Oral BID Johnn Hai, MD      . cloNIDine (CATAPRES) tablet 0.1 mg  0.1 mg Oral TID Johnn Hai, MD      . gabapentin (NEURONTIN) capsule 300 mg  300 mg Oral TID Johnn Hai, MD      . hydrOXYzine (ATARAX/VISTARIL) tablet 25 mg  25 mg Oral Q6H PRN Laverle Hobby, PA-C      . OLANZapine zydis (ZYPREXA) disintegrating tablet 5 mg  5 mg Oral Q8H PRN Patriciaann Clan E, PA-C       And  . ziprasidone (GEODON) injection 20 mg  20 mg Intramuscular PRN Laverle Hobby, PA-C      . traZODone (DESYREL) tablet 50 mg  50 mg Oral QHS,MR X 1 Simon, Spencer E, PA-C      . zolpidem (AMBIEN) tablet 10 mg  10 mg Oral QHS Johnn Hai, MD       PTA Medications: Medications Prior to Admission  Medication Sig Dispense Refill Last Dose  . folic acid (FOLVITE) 1 MG tablet Take 1 mg by mouth at bedtime.   4 unknown  . levocetirizine (XYZAL) 5 MG tablet Take 5 mg by mouth at bedtime.   3 unknown  . losartan (COZAAR) 100 MG tablet Take 100 mg by mouth daily.  5 unknown  . methadone (DOLOPHINE) 5 MG tablet Take 5 mg by mouth 5 (five) times daily.    unknown  . nystatin-triamcinolone ointment (MYCOLOG) Apply 1 application topically 2 (two) times daily.    unknown  . OLANZapine zydis (ZYPREXA) 5 MG disintegrating tablet Take 1 tablet (5 mg total) by mouth at bedtime. 30 tablet 0   . oxyCODONE-acetaminophen (PERCOCET/ROXICET) 5-325 MG  tablet Take 1 tablet by mouth every 6 (six) hours as needed for moderate pain or severe pain.    unknown  . vitamin C (ASCORBIC ACID) 500 MG tablet Take 1,000 mg by mouth every evening.   unknown    Patient Stressors: Financial difficulties Occupational concerns  Patient Strengths: Ability for insight Average or above average intelligence Capable of independent living [employer] of knowledge Motivation for treatment/growth  Treatment Modalities: Medication Management, Group therapy, Case management,  1 to 1 session with clinician, Psychoeducation, Recreational therapy.   Physician Treatment Plan for Primary Diagnosis: <principal problem not specified> Long Term Goal(s): Improvement in symptoms so as ready for discharge Improvement in symptoms so as ready for discharge   Short Term Goals: Ability to disclose and discuss suicidal ideas Compliance with prescribed medications will improve  Medication Management: Evaluate patient's response, side effects, and tolerance of medication regimen.  Therapeutic Interventions: 1 to 1 sessions, Unit Group sessions and Medication administration.  Evaluation of Outcomes: Not Met  Physician Treatment Plan for Secondary Diagnosis: Active Problems:   MDD (major depressive disorder), severe (HCC)   Benzodiazepine withdrawal with delirium ([place])  Long Term Goal(s): Improvement in symptoms so as ready for discharge Improvement in symptoms so as ready for discharge  Short Term Goals: Ability to disclose and discuss suicidal ideas Compliance with prescribed medications will improve     Medication Management: Evaluate patient's response, side effects, and tolerance of medication regimen.  Therapeutic Interventions: 1 to 1 sessions, Unit Group sessions and Medication administration.  Evaluation of Outcomes: Not Met   RN Treatment Plan for Primary Diagnosis: <principal problem not specified> Long Term Goal(s): Knowledge of disease and  therapeutic regimen to maintain health will improve  Short Term Goals: Ability to identify and develop effective coping behaviors will improve and Compliance with prescribed medications will improve  Medication Management: RN will administer medications as ordered by provider, will assess and evaluate patient's response and provide education to patient for prescribed medication. RN will report any adverse and/or side effects to prescribing provider.  Therapeutic Interventions: 1 on 1 counseling sessions, Psychoeducation, Medication administration, Evaluate responses to treatment, Monitor vital signs and CBGs as ordered, Perform/monitor CIWA, COWS, AIMS and Fall Risk screenings as ordered, Perform wound care treatments as ordered.  Evaluation of Outcomes: Not Met   LCSW Treatment Plan for Primary Diagnosis: <principal problem not specified> Long Term Goal(s): Safe transition to appropriate next level of care at discharge, Engage patient in therapeutic group addressing interpersonal concerns.  Short Term Goals: Engage patient in aftercare planning with referrals and resources, Increase social support and Increase skills for wellness and recovery  Therapeutic Interventions: Assess for all discharge needs, 1 to 1 time with Social worker, Explore available resources and support systems, Assess for adequacy in community support network, Educate family and significant other(s) on suicide prevention, Complete Psychosocial Assessment, Interpersonal group therapy.  Evaluation of Outcomes: Not Met   Progress in Treatment: Attending groups: No. Participating in groups: No. Taking medication as prescribed: No. Toleration medication: No. Family/Significant other contact made: No, will contact:  when given permission Patient understands diagnosis: Yes. Discussing patient identified problems/goals with staff: Yes. Medical problems stabilized or resolved: Yes. Denies suicidal/homicidal ideation:  Yes. Issues/concerns per patient self-inventory: No. Other: none  New problem(s) identified: No, Describe:  none  New Short Term/Long Term Goal(s):  Patient Goals:  "get better"  Discharge Plan or Barriers:   Reason for Continuation of Hospitalization: Medication stabilization Withdrawal symptoms  Estimated Length of Stay: 2-4 days.  Attendees: Patient: Danielle Harrington 01/08/2019   Physician: Dr Jake Samples, MD 01/08/2019   Nursing: Elesa Massed, RN 01/08/2019   RN Care Manager: 01/08/2019   Social Worker: Lurline Idol, LCSW 01/08/2019   Recreational Therapist:  01/08/2019   Other:  01/08/2019   Other:  01/08/2019   Other: 01/08/2019        Scribe for Treatment Team: Joanne Chars, LCSW 01/08/2019 1:05 PM

## 2019-01-09 MED ORDER — CLONAZEPAM 0.5 MG PO TABS: 0.2500 mg | ORAL_TABLET | Freq: Two times a day (BID) | ORAL | 0 refills | Status: DC

## 2019-01-09 MED ORDER — GABAPENTIN 300 MG PO CAPS: 300.0000 mg | ORAL_CAPSULE | Freq: Three times a day (TID) | ORAL | 2 refills | Status: DC

## 2019-01-09 MED ORDER — CLONIDINE HCL 0.1 MG PO TABS: 0.1000 mg | ORAL_TABLET | Freq: Two times a day (BID) | ORAL | 11 refills | Status: DC

## 2019-01-09 NOTE — Progress Notes (Signed)
Recreation Therapy Notes  Date: 3.19.20 Time: 0950 Location: 500 [address] Dayroom   Group Topic: Communication, Team Building, Problem Solving  Goal Area(s) Addresses:  Patient will effectively work with peer towards shared goal.  Patient will identify skill used to make activity successful.  Patient will identify how skills used during activity can be used to reach post d/c goals.   Behavioral Response: Engaged  Intervention: STEM Activity   Activity: [employer]. Patients were provided the following materials: 5 drinking straws, 5 rubber bands, 5 paper clips, 2 index cards, and 2 drinking cups. Using the provided materials patients were asked to build a launching mechanisms to launch a ping pong ball approximately 12 feet. Patients were divided into teams of 3-5.   Education: Education officer, community, Dentist.   Education Outcome: Acknowledges education/In group clarification offered/Needs additional education.   Clinical Observations/Feedback: Pt worked well with her peers in coming up with a Manufacturing engineer.  Pt made suggestions as well as made suggestions for the launcher.  Pt expressed it was easy getting suggestions from everyone.  Pt also stated it was hard listening to the suggestions of everyone in the group and putting them into action.    Victorino Sparrow, LRT/CTRS    Victorino Sparrow A 01/09/2019 11:43 AM

## 2019-01-09 NOTE — [facility] Suicide Risk Assessment (Signed)
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 Discharge Diagnoses: Active Problems:   MDD (major depressive disorder), severe (HCC)   Benzodiazepine withdrawal with delirium ([place])   Total Time spent with patient: 45 minutes  Patient is alert oriented to person place situation and time without thoughts of harming self or others contracting fully no cravings tremors or withdrawal symptoms baseline at present Demographic Factors:  Caucasian  Loss Factors: Decline in physical health  Historical Factors: NA  Risk Reduction Factors:   Sense of responsibility to family and Religious beliefs about death  Continued Clinical Symptoms:  Chronic Pain  Cognitive Features That Contribute To Risk:  None    Suicide Risk:  Minimal: No identifiable suicidal ideation.  Patients presenting with no risk factors but with morbid ruminations; may be classified as minimal risk based on the severity of the depressive symptoms  Follow-up Information    [facility] Follow up on 01/23/2019.   Why:  You have a video appt with Dr. Alyson Ingles on 01/23/19, at 3:15pm.  Please contact the office to discuss how this works.  A message has been sent to Dr. Alyson Ingles as well in an attempt to move this appt to an earlier date.  Contact information: [address], [place] [address] P: [phone] F: [phone]          Plan Of Care/Follow-up recommendations:  Activity:  full  Arienne Gartin, MD 01/09/2019, 9:59 AM

## 2019-01-09 NOTE — Progress Notes (Signed)
Recreation Therapy Notes  INPATIENT RECREATION TR PLAN  Patient Details Name: MELAYA HOSELTON MRN: [id number] DOB: 24-Sep-1976 Today's Date: 01/09/2019  Rec Therapy Plan Is patient appropriate for Therapeutic Recreation?: Yes Treatment times per week: about 3 days  Estimated Length of Stay: 5-7 days TR Treatment/Interventions: Group participation (Comment)  Discharge Criteria Pt will be discharged from therapy if:: Discharged Treatment plan/goals/alternatives discussed and agreed upon by:: Patient/family  Discharge Summary Short term goals set: See patient care plan Short term goals met: Adequate for discharge Progress toward goals comments: Groups attended Which groups?: Other (Comment)(Team building) Reason goals not met: Pt is being discharged. Therapeutic equipment acquired: N/A Reason patient discharged from therapy: Discharge from hospital Pt/family agrees with progress & goals achieved: Yes Date patient discharged from therapy: 01/09/19    Victorino Sparrow, LRT/CTRS  Ria Comment, Altan Kraai A 01/09/2019, 12:11 PM

## 2019-01-09 NOTE — Plan of Care (Signed)
Pt was able to identify some coping strategies at completion of recreation therapy group session.   Victorino Sparrow, LRT/CTRS

## 2019-01-09 NOTE — Discharge Summary (Signed)
Physician Discharge Summary Note  Patient:  Danielle Harrington is an 43 y.o., female MRN:  [id number] DOB:  September 16, 1976 Patient phone:  [phone] (home)  Patient address:   [address] [place] [address],  Total Time spent with patient: 15 minutes  Date of Admission:  01/07/2019 Date of Discharge: 01/09/19  Reason for Admission:  Benzodiazepine withdrawal with delirium  Principal Problem: Benzodiazepine withdrawal with delirium Danielle Harrington) Discharge Diagnoses: Principal Problem:   Benzodiazepine withdrawal with delirium ([place]) Active Problems:   MDD (major depressive disorder), severe ([place])   Past Psychiatric History: Per admission H&P:  Treated for depression with the combination of meds most recently Cymbalta has been her primary antidepressant but there have been prescriptions for Wellbutrin and nortriptyline  Past Medical History:  Past Medical History:  Diagnosis Date  . ADD (attention deficit disorder)   . Anxiety   . Arthritis    R knee, sciata treated - injection- 2012  . Complication of anesthesia    used scop. patch in the past  . Depression   . Endometriosis   . Family history of adverse reaction to anesthesia    N&V  . Headache    migraine, last one 3-4 months ago   . History of anemia   . History of bronchitis   . History of kidney stones   . Hypertension   . Insomnia    Ambien  . PONV (postoperative nausea and vomiting)     Past Surgical History:  Procedure Laterality Date  . APPENDECTOMY    . CESAREAN SECTION    . EXCISION MORTON'S NEUROMA Left 09/29/2016   Procedure: EXCISION MORTON'S NEUROMA LEFT FOOT 3RD WEB SPACE;  Surgeon: Newt Minion, MD;  Location: [facility];  Service: Orthopedics;  Laterality: Left;  . KNEE SURGERY Right 1995 & 2009   ACL repair & arthroscopy -2009  . LAPAROSCOPIC ENDOMETRIOSIS FULGURATION    . OPEN REDUCTION INTERNAL FIXATION (ORIF) FOOT LISFRANC FRACTURE Left 02/18/2016   Procedure: OPEN REDUCTION INTERNAL FIXATION  (ORIF) FOOT LISFRANC FRACTURE;  Surgeon: Newt Minion, MD;  Location: [facility];  Service: Orthopedics;  Laterality: Left;  . TONSILLECTOMY    . ULNAR NERVE REPAIR    . WRIST SURGERY Right    torn cartilage    Family History:  Family History  Problem Relation Age of Onset  . Cancer Mother   . Heart failure Father   . Breast cancer Sister    Family Psychiatric  History: unknown Social History:  Social History   Substance and Sexual Activity  Alcohol Use Yes   Comment: socially- 1-2 drinks per week      Social History   Substance and Sexual Activity  Drug Use No    Social History   Socioeconomic History  . Marital status: Divorced    Spouse name: Not on file  . Number of children: Not on file  . Years of education: Not on file  . Highest education level: Not on file  Occupational History  . Not on file  Social Needs  . Financial resource strain: Not on file  . Food insecurity:    Worry: Not on file    Inability: Not on file  . Transportation needs:    Medical: Not on file    Non-medical: Not on file  Tobacco Use  . Smoking status: Former Smoker    Packs/day: 0.50    Last attempt to quit: 08/2016    Years since quitting: 2.3  . Smokeless tobacco:  Never Used  . Tobacco comment: Sts she recently quit (08/30/16)  Substance and Sexual Activity  . Alcohol use: Yes    Comment: socially- 1-2 drinks per week   . Drug use: No  . Sexual activity: Not on file  Lifestyle  . Physical activity:    Days per week: Not on file    Minutes per session: Not on file  . Stress: Not on file  Relationships  . Social connections:    Talks on phone: Not on file    Gets together: Not on file    Attends religious service: Not on file    Active member of club or organization: Not on file    Attends meetings of clubs or organizations: Not on file    Relationship status: Not on file  Other Topics Concern  . Not on file  Social History Narrative  . Not on file    [place] Course:   From admission H&P 01/08/2019: This is the first psychiatric admission here for 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 Patient was in her normal state of health (with polypharmacy to include other agents such as bupropion, Pamelor, Ambien, Lyrica, Cymbalta, and olanzapine) ER was discovered at her home on 3/13 and confused state, making nonsensical statements, was brought to the emergency department by ambulance. Confusional symptoms persisted during her protracted stay in the emergency department while she was worked up, and the EEG showed diffuse slowing, drug screen was negative for all compounds, CT scan of the head without contrast was normal- At the present time the patient is alert and oriented to person place time day and situation she is cooperative with the interview process she believes she is closer to her baseline status but still has a little trouble collecting her thoughts, she denies auditory and visual hallucinations denies wanting to harm self or others and has a poor memory for recent events. She does however acknowledge she went off her Xanax "a few days ago" and does not remember the last time she had Xanax she had been dependent upon at least 1.5 mg a day for approximately 5 years. Therefore I think the most likely explanation for her acute confusion is that she went into alprazolam withdrawal, while still self administering the polypharmacy that has been iatrogenic. She accepts this explanation her blood pressure is a bit up and her pulse rate is up to 119 which would be consistent with protracted benzodiazepine withdrawal.  [name] was admitted for delirium related to benzodiazepine withdrawal. On admission to [facility], she was alert, oriented and cooperative. She denied SI/HI/AVH. She participated in group therapy  on the unit. No agitated or disruptive behaviors. Collateral information was obtained from patient's mother, who denied safety concerns. [name] remained on the [facility] unit for 2 days. She showed no signs of withdrawal or acute distress. She was discharged on the medications listed below. She has shown improvement with improved mood, affect, sleep, appetite, and interaction. She denies any SI/HI/AVH and contracts for safety. She refused referrals for psychiatry or therapy. She agrees to follow up at [facility] (see below). Patient is provided with prescriptions for medications upon discharge. Her sister is picking her up for discharge home.  Physical Findings: AIMS: Facial and Oral Movements Muscles of Facial Expression: None, normal Lips and  Perioral Area: None, normal Jaw: None, normal Tongue: None, normal,Extremity Movements Upper (arms, wrists, hands, fingers): None, normal Lower (legs, knees, ankles, toes): None, normal, Trunk Movements Neck, shoulders, hips: None, normal, Overall Severity Severity of abnormal movements (highest score from questions above): None, normal Incapacitation due to abnormal movements: None, normal Patient's awareness of abnormal movements (rate only patient's report): No Awareness, Dental Status Current problems with teeth and/or dentures?: No Does patient usually wear dentures?: No  CIWA:    COWS:     Musculoskeletal: Strength & Muscle Tone: within normal limits Gait & Station: normal Patient leans: N/A  Psychiatric Specialty Exam: Physical Exam  Nursing note and vitals reviewed. Constitutional: She is oriented to person, place, and time. She appears well-developed and well-nourished.  Cardiovascular: Normal rate.  Respiratory: Effort normal.  Neurological: She is alert and oriented to person, place, and time.  Psychiatric: Her behavior is normal.    Review of Systems  Constitutional: Negative.   Psychiatric/Behavioral: Negative for  depression, hallucinations, memory loss, substance abuse and suicidal ideas. The patient is not nervous/anxious and does not have insomnia.     Blood pressure (!) 121/106, pulse (!) 104, temperature 98.4 F (36.9 C), temperature source Oral, resp. rate 18, height 5' 4"  (1.626 m), weight 87.5 kg.Body mass index is 33.13 kg/m.  General Appearance: Casual  Eye Contact:  Good  Speech:  Normal Rate  Volume:  Normal  Mood:  Euthymic  Affect:  Congruent  Thought Process:  Coherent  Orientation:  Full (Time, Place, and Person)  Thought Content:  WDL  Suicidal Thoughts:  No  Homicidal Thoughts:  No  Memory:  Immediate;   Good Recent;   Fair  Judgement:  Intact  Insight:  Fair  Psychomotor Activity:  Normal  Concentration:  Concentration: Fair  Recall:  [employer] of Knowledge:  Fair  Language:  Good  Akathisia:  No  Handed:  Right  AIMS (if indicated):     Assets:  Communication Skills Desire for Improvement Housing Social Support  ADL's:  Intact  Cognition:  WNL  Sleep:  Number of Hours: 6.75     Have you used any form of tobacco in the last 30 days? (Cigarettes, Smokeless Tobacco, Cigars, and/or Pipes): No  Has this patient used any form of tobacco in the last 30 days? (Cigarettes, Smokeless Tobacco, Cigars, and/or Pipes)  No  Blood Alcohol level:  Lab Results  Component Value Date   ETH (H) 03/26/2011    <11        LOWEST DETECTABLE LIMIT FOR SERUM ALCOHOL IS 5 mg/dL FOR MEDICAL PURPOSES ONLY    Metabolic Disorder Labs:  No results found for: HGBA1C, MPG No results found for: PROLACTIN No results found for: CHOL, TRIG, HDL, CHOLHDL, VLDL, LDLCALC  See Psychiatric Specialty Exam and Suicide Risk Assessment completed by Attending Physician prior to discharge.  Discharge destination:  Home  Is patient on multiple antipsychotic therapies at discharge:  No   Has Patient had three or more failed trials of antipsychotic monotherapy by history:  No  Recommended Plan  for Multiple Antipsychotic Therapies: NA   Allergies as of 01/09/2019      Reactions   Other Nausea And Vomiting   general anesthesia   Adhesive [tape] Itching, Rash   Please use "paper" tape      Medication List    STOP taking these medications   methadone 5 MG tablet Commonly known as:  DOLOPHINE   OLANZapine zydis 5 MG disintegrating tablet  Commonly known as:  ZYPREXA   oxyCODONE-acetaminophen 5-325 MG tablet Commonly known as:  PERCOCET/ROXICET     TAKE these medications     Indication  clonazePAM 0.5 MG tablet Commonly known as:  KLONOPIN Take 0.5 tablets (0.25 mg total) by mouth 2 (two) times daily.  Indication:  Manic-Depression   cloNIDine 0.1 MG tablet Commonly known as:  CATAPRES Take 1 tablet (0.1 mg total) by mouth 2 (two) times daily.  Indication:  High Blood Pressure Disorder   folic acid 1 MG tablet Commonly known as:  FOLVITE Take 1 mg by mouth at bedtime.  Indication:  Anemia From Inadequate Folic Acid   gabapentin 300 MG capsule Commonly known as:  NEURONTIN Take 1 capsule (300 mg total) by mouth 3 (three) times daily.  Indication:  Pain Following an Operation   levocetirizine 5 MG tablet Commonly known as:  XYZAL Take 5 mg by mouth at bedtime.  Indication:  Persistent Hives of Unknown Cause   losartan 100 MG tablet Commonly known as:  COZAAR Take 100 mg by mouth daily.  Indication:  High Blood Pressure Disorder   nystatin-triamcinolone ointment Commonly known as:  MYCOLOG Apply 1 application topically 2 (two) times daily.  Indication:  Skin Infection due to Candida Yeast   vitamin C 500 MG tablet Commonly known as:  ASCORBIC ACID Take 1,000 mg by mouth every evening.  Indication:  Inadequate Vitamin C      Follow-up Information    [facility] Follow up on 01/23/2019.   Why:  You have a video appt with Dr. Alyson Ingles on 01/23/19, at 3:15pm.  Please contact the office to discuss how this works.  A message has been sent  to Dr. Alyson Ingles as well in an attempt to move this appt to an earlier date.  Contact information: [place],  [address] P: [phone] F: [phone]          Follow-up recommendations: Activity as tolerated. Diet as recommended by primary care physician. Keep all scheduled follow-up appointments as recommended.   Comments:   Patient is instructed to take all prescribed medications as recommended. Report any side effects or adverse reactions to your outpatient psychiatrist. Patient is instructed to abstain from alcohol and illegal drugs while on prescription medications. In the event of worsening symptoms, patient is instructed to call the crisis hotline, 911, or go to the nearest emergency department for evaluation and treatment.  Signed: Connye Burkitt, NP 01/09/2019, 2:46 PM

## 2019-01-09 NOTE — Progress Notes (Signed)
  [facility] Adult Case Management Discharge Plan :  Will you be returning to the same living situation after discharge:  Yes,  own home At discharge, do you have transportation home?: Yes,  sister Do you have the ability to pay for your medications: Yes,  BC[employer]  Release of information consent forms completed and in the chart;  Patient's signature needed at discharge.  Patient to Follow up at: Follow-up Information    [facility] Follow up on 01/23/2019.   Why:  You have a video appt with Dr. Alyson Ingles on 01/23/19, at 3:15pm.  Please contact the office to discuss how this works.  A message has been sent to Dr. Alyson Ingles as well in an attempt to move this appt to an earlier date.  Contact information: [place], [place] [address] P: [phone] F: [phone]          Next level of care provider has access to [place] and Suicide Prevention discussed: Yes,  with mother  Have you used any form of tobacco in the last 30 days? (Cigarettes, Smokeless Tobacco, Cigars, and/or Pipes): No  Has patient been referred to the Quitline?: N/A patient is not a smoker  Patient has been referred for addiction treatment: Pt. refused referral  Joanne Chars, [place] 01/09/2019, 10:02 AM

## 2019-01-14 DIAGNOSIS — B373 Candidiasis of vulva and vagina: Secondary | ICD-10-CM | POA: Diagnosis not present

## 2019-01-14 DIAGNOSIS — Z6833 Body mass index (BMI) 33.0-33.9, adult: Secondary | ICD-10-CM | POA: Diagnosis not present

## 2019-01-14 DIAGNOSIS — Z713 Dietary counseling and surveillance: Secondary | ICD-10-CM | POA: Diagnosis not present

## 2019-01-16 LAB — METHADONE,MS,WB/SP RFX
METHADONE CONFIRMATION: POSITIVE
METHADONE: 81.9 ng/mL

## 2019-01-16 LAB — DRUG SCREEN 10 W/CONF, SERUM
Amphetamines, IA: NEGATIVE ng/mL
BARBITURATES, IA: NEGATIVE ug/mL
Benzodiazepines, IA: NEGATIVE ng/mL
Cocaine & Metabolite, IA: NEGATIVE ng/mL
Methadone, IA: POSITIVE ng/mL
Opiates, IA: NEGATIVE ng/mL
Oxycodones, IA: NEGATIVE ng/mL
PHENCYCLIDINE, IA: NEGATIVE ng/mL
Propoxyphene, IA: NEGATIVE ng/mL
THC(MARIJUANA) METABOLITE, IA: NEGATIVE ng/mL

## 2019-01-23 DIAGNOSIS — Z0000 Encounter for general adult medical examination without abnormal findings: Secondary | ICD-10-CM | POA: Diagnosis not present

## 2019-01-23 DIAGNOSIS — G4700 Insomnia, unspecified: Secondary | ICD-10-CM | POA: Diagnosis not present

## 2019-01-23 DIAGNOSIS — F419 Anxiety disorder, unspecified: Secondary | ICD-10-CM | POA: Diagnosis not present

## 2019-01-23 DIAGNOSIS — I10 Essential (primary) hypertension: Secondary | ICD-10-CM | POA: Diagnosis not present

## 2019-01-23 DIAGNOSIS — F324 Major depressive disorder, single episode, in partial remission: Secondary | ICD-10-CM | POA: Diagnosis not present

## 2019-01-24 DIAGNOSIS — I10 Essential (primary) hypertension: Secondary | ICD-10-CM | POA: Diagnosis not present

## 2019-02-07 DIAGNOSIS — B0229 Other postherpetic nervous system involvement: Secondary | ICD-10-CM | POA: Diagnosis not present

## 2019-02-07 DIAGNOSIS — M549 Dorsalgia, unspecified: Secondary | ICD-10-CM | POA: Diagnosis not present

## 2019-02-07 DIAGNOSIS — G894 Chronic pain syndrome: Secondary | ICD-10-CM | POA: Diagnosis not present

## 2019-02-07 DIAGNOSIS — G90522 Complex regional pain syndrome I of left lower limb: Secondary | ICD-10-CM | POA: Diagnosis not present

## 2019-02-12 ENCOUNTER — Telehealth: Payer: Self-pay | Admitting: Neurology

## 2019-02-12 NOTE — Telephone Encounter (Signed)
Hey Dr. Felecia Shelling. We received a referral on pt from their PCP for altered mental status-they are wanting to determine if the cause is metabolic or neurological. They were seen at [facility] for this in March, and neurology believed it to be psychogenic in nature. Could you please review and see if this is something we can see the pt for?

## 2019-02-12 NOTE — Telephone Encounter (Signed)
It seems like she had medication and psych issues so probably best for a psychiatry evaluation/follow up

## 2019-02-12 NOTE — Telephone Encounter (Signed)
Noted. I will let the referring office know.

## 2019-02-13 DIAGNOSIS — R7989 Other specified abnormal findings of blood chemistry: Secondary | ICD-10-CM | POA: Diagnosis not present

## 2019-03-05 DIAGNOSIS — G894 Chronic pain syndrome: Secondary | ICD-10-CM | POA: Diagnosis not present

## 2019-04-17 DIAGNOSIS — Z6830 Body mass index (BMI) 30.0-30.9, adult: Secondary | ICD-10-CM | POA: Diagnosis not present

## 2019-04-17 DIAGNOSIS — Z713 Dietary counseling and surveillance: Secondary | ICD-10-CM | POA: Diagnosis not present

## 2019-04-17 DIAGNOSIS — M549 Dorsalgia, unspecified: Secondary | ICD-10-CM | POA: Diagnosis not present

## 2019-04-22 DIAGNOSIS — G894 Chronic pain syndrome: Secondary | ICD-10-CM | POA: Diagnosis not present

## 2019-04-22 DIAGNOSIS — M549 Dorsalgia, unspecified: Secondary | ICD-10-CM | POA: Diagnosis not present

## 2019-04-22 DIAGNOSIS — B0229 Other postherpetic nervous system involvement: Secondary | ICD-10-CM | POA: Diagnosis not present

## 2019-04-22 DIAGNOSIS — G90522 Complex regional pain syndrome I of left lower limb: Secondary | ICD-10-CM | POA: Diagnosis not present

## 2019-05-22 DIAGNOSIS — M5137 Other intervertebral disc degeneration, lumbosacral region: Secondary | ICD-10-CM | POA: Diagnosis not present

## 2019-05-22 DIAGNOSIS — G894 Chronic pain syndrome: Secondary | ICD-10-CM | POA: Diagnosis not present

## 2019-05-22 DIAGNOSIS — B0229 Other postherpetic nervous system involvement: Secondary | ICD-10-CM | POA: Diagnosis not present

## 2019-05-22 DIAGNOSIS — M549 Dorsalgia, unspecified: Secondary | ICD-10-CM | POA: Diagnosis not present

## 2019-05-22 DIAGNOSIS — Z5181 Encounter for therapeutic drug level monitoring: Secondary | ICD-10-CM | POA: Diagnosis not present

## 2019-05-22 DIAGNOSIS — Z79899 Other long term (current) drug therapy: Secondary | ICD-10-CM | POA: Diagnosis not present

## 2019-05-22 DIAGNOSIS — G90522 Complex regional pain syndrome I of left lower limb: Secondary | ICD-10-CM | POA: Diagnosis not present

## 2019-05-22 DIAGNOSIS — M47817 Spondylosis without myelopathy or radiculopathy, lumbosacral region: Secondary | ICD-10-CM | POA: Diagnosis not present

## 2019-06-25 DIAGNOSIS — R109 Unspecified abdominal pain: Secondary | ICD-10-CM | POA: Diagnosis not present

## 2019-06-25 DIAGNOSIS — Z6830 Body mass index (BMI) 30.0-30.9, adult: Secondary | ICD-10-CM | POA: Diagnosis not present

## 2019-06-25 DIAGNOSIS — Z713 Dietary counseling and surveillance: Secondary | ICD-10-CM | POA: Diagnosis not present

## 2019-06-27 DIAGNOSIS — R109 Unspecified abdominal pain: Secondary | ICD-10-CM | POA: Diagnosis not present

## 2019-07-25 DIAGNOSIS — Z6827 Body mass index (BMI) 27.0-27.9, adult: Secondary | ICD-10-CM | POA: Diagnosis not present

## 2019-07-25 DIAGNOSIS — M47816 Spondylosis without myelopathy or radiculopathy, lumbar region: Secondary | ICD-10-CM | POA: Diagnosis not present

## 2019-07-25 DIAGNOSIS — M549 Dorsalgia, unspecified: Secondary | ICD-10-CM | POA: Diagnosis not present

## 2019-07-25 DIAGNOSIS — Z01411 Encounter for gynecological examination (general) (routine) with abnormal findings: Secondary | ICD-10-CM | POA: Diagnosis not present

## 2019-07-25 DIAGNOSIS — Z114 Encounter for screening for human immunodeficiency virus [HIV]: Secondary | ICD-10-CM | POA: Diagnosis not present

## 2019-07-25 DIAGNOSIS — B977 Papillomavirus as the cause of diseases classified elsewhere: Secondary | ICD-10-CM | POA: Diagnosis not present

## 2019-07-25 DIAGNOSIS — G894 Chronic pain syndrome: Secondary | ICD-10-CM | POA: Diagnosis not present

## 2019-07-25 DIAGNOSIS — Z124 Encounter for screening for malignant neoplasm of cervix: Secondary | ICD-10-CM | POA: Diagnosis not present

## 2019-07-25 DIAGNOSIS — Z720 Tobacco use: Secondary | ICD-10-CM | POA: Diagnosis not present

## 2019-07-25 DIAGNOSIS — Z1151 Encounter for screening for human papillomavirus (HPV): Secondary | ICD-10-CM | POA: Diagnosis not present

## 2019-07-25 DIAGNOSIS — Z113 Encounter for screening for infections with a predominantly sexual mode of transmission: Secondary | ICD-10-CM | POA: Diagnosis not present

## 2019-07-25 DIAGNOSIS — Z118 Encounter for screening for other infectious and parasitic diseases: Secondary | ICD-10-CM | POA: Diagnosis not present

## 2019-07-25 DIAGNOSIS — B0229 Other postherpetic nervous system involvement: Secondary | ICD-10-CM | POA: Diagnosis not present

## 2019-07-25 DIAGNOSIS — E6609 Other obesity due to excess calories: Secondary | ICD-10-CM | POA: Diagnosis not present

## 2019-07-25 DIAGNOSIS — Z01419 Encounter for gynecological examination (general) (routine) without abnormal findings: Secondary | ICD-10-CM | POA: Diagnosis not present

## 2019-07-25 DIAGNOSIS — Z1159 Encounter for screening for other viral diseases: Secondary | ICD-10-CM | POA: Diagnosis not present

## 2019-07-25 DIAGNOSIS — Z1231 Encounter for screening mammogram for malignant neoplasm of breast: Secondary | ICD-10-CM | POA: Diagnosis not present

## 2019-07-31 ENCOUNTER — Other Ambulatory Visit: Payer: Self-pay | Admitting: Obstetrics

## 2019-07-31 DIAGNOSIS — F324 Major depressive disorder, single episode, in partial remission: Secondary | ICD-10-CM | POA: Diagnosis not present

## 2019-07-31 DIAGNOSIS — F419 Anxiety disorder, unspecified: Secondary | ICD-10-CM | POA: Diagnosis not present

## 2019-07-31 DIAGNOSIS — G4700 Insomnia, unspecified: Secondary | ICD-10-CM | POA: Diagnosis not present

## 2019-07-31 DIAGNOSIS — N6489 Other specified disorders of breast: Secondary | ICD-10-CM

## 2019-07-31 DIAGNOSIS — I10 Essential (primary) hypertension: Secondary | ICD-10-CM | POA: Diagnosis not present

## 2019-08-06 ENCOUNTER — Ambulatory Visit
Admission: RE | Admit: 2019-08-06 | Discharge: 2019-08-06 | Disposition: A | Discharge status: Home / Self Care | Payer: BLUE CROSS/BLUE SHIELD | Source: Ambulatory Visit | Attending: Obstetrics | Admitting: Obstetrics

## 2019-08-06 ENCOUNTER — Other Ambulatory Visit: Payer: Self-pay | Admitting: Obstetrics

## 2019-08-06 ENCOUNTER — Other Ambulatory Visit: Payer: Self-pay

## 2019-08-06 DIAGNOSIS — N6489 Other specified disorders of breast: Secondary | ICD-10-CM

## 2019-08-06 DIAGNOSIS — R922 Inconclusive mammogram: Secondary | ICD-10-CM | POA: Diagnosis not present

## 2019-08-06 DIAGNOSIS — N6002 Solitary cyst of left breast: Secondary | ICD-10-CM | POA: Diagnosis not present

## 2019-08-11 DIAGNOSIS — B0229 Other postherpetic nervous system involvement: Secondary | ICD-10-CM | POA: Diagnosis not present

## 2019-08-11 DIAGNOSIS — M549 Dorsalgia, unspecified: Secondary | ICD-10-CM | POA: Diagnosis not present

## 2019-08-11 DIAGNOSIS — G90522 Complex regional pain syndrome I of left lower limb: Secondary | ICD-10-CM | POA: Diagnosis not present

## 2019-08-11 DIAGNOSIS — Z9689 Presence of other specified functional implants: Secondary | ICD-10-CM | POA: Diagnosis not present

## 2019-08-11 DIAGNOSIS — G894 Chronic pain syndrome: Secondary | ICD-10-CM | POA: Diagnosis not present

## 2019-08-11 DIAGNOSIS — M47816 Spondylosis without myelopathy or radiculopathy, lumbar region: Secondary | ICD-10-CM | POA: Diagnosis not present

## 2019-08-26 DIAGNOSIS — M47816 Spondylosis without myelopathy or radiculopathy, lumbar region: Secondary | ICD-10-CM | POA: Diagnosis not present

## 2019-08-26 DIAGNOSIS — M6281 Muscle weakness (generalized): Secondary | ICD-10-CM | POA: Diagnosis not present

## 2019-08-26 DIAGNOSIS — G90522 Complex regional pain syndrome I of left lower limb: Secondary | ICD-10-CM | POA: Diagnosis not present

## 2019-08-26 DIAGNOSIS — R262 Difficulty in walking, not elsewhere classified: Secondary | ICD-10-CM | POA: Diagnosis not present

## 2019-09-02 DIAGNOSIS — R201 Hypoesthesia of skin: Secondary | ICD-10-CM | POA: Diagnosis not present

## 2019-09-02 DIAGNOSIS — G90522 Complex regional pain syndrome I of left lower limb: Secondary | ICD-10-CM | POA: Diagnosis not present

## 2019-09-02 DIAGNOSIS — M6281 Muscle weakness (generalized): Secondary | ICD-10-CM | POA: Diagnosis not present

## 2019-09-02 DIAGNOSIS — R262 Difficulty in walking, not elsewhere classified: Secondary | ICD-10-CM | POA: Diagnosis not present

## 2019-09-04 DIAGNOSIS — R201 Hypoesthesia of skin: Secondary | ICD-10-CM | POA: Diagnosis not present

## 2019-09-04 DIAGNOSIS — B0229 Other postherpetic nervous system involvement: Secondary | ICD-10-CM | POA: Diagnosis not present

## 2019-09-04 DIAGNOSIS — G90522 Complex regional pain syndrome I of left lower limb: Secondary | ICD-10-CM | POA: Diagnosis not present

## 2019-09-04 DIAGNOSIS — G894 Chronic pain syndrome: Secondary | ICD-10-CM | POA: Diagnosis not present

## 2019-09-04 DIAGNOSIS — M47816 Spondylosis without myelopathy or radiculopathy, lumbar region: Secondary | ICD-10-CM | POA: Diagnosis not present

## 2019-09-04 DIAGNOSIS — M6281 Muscle weakness (generalized): Secondary | ICD-10-CM | POA: Diagnosis not present

## 2019-09-04 DIAGNOSIS — R262 Difficulty in walking, not elsewhere classified: Secondary | ICD-10-CM | POA: Diagnosis not present

## 2019-09-12 DIAGNOSIS — M6281 Muscle weakness (generalized): Secondary | ICD-10-CM | POA: Diagnosis not present

## 2019-09-12 DIAGNOSIS — R262 Difficulty in walking, not elsewhere classified: Secondary | ICD-10-CM | POA: Diagnosis not present

## 2019-09-12 DIAGNOSIS — R201 Hypoesthesia of skin: Secondary | ICD-10-CM | POA: Diagnosis not present

## 2019-09-12 DIAGNOSIS — G90522 Complex regional pain syndrome I of left lower limb: Secondary | ICD-10-CM | POA: Diagnosis not present

## 2019-09-25 DIAGNOSIS — G90522 Complex regional pain syndrome I of left lower limb: Secondary | ICD-10-CM | POA: Diagnosis not present

## 2019-09-25 DIAGNOSIS — R201 Hypoesthesia of skin: Secondary | ICD-10-CM | POA: Diagnosis not present

## 2019-09-25 DIAGNOSIS — M6281 Muscle weakness (generalized): Secondary | ICD-10-CM | POA: Diagnosis not present

## 2019-09-25 DIAGNOSIS — R262 Difficulty in walking, not elsewhere classified: Secondary | ICD-10-CM | POA: Diagnosis not present

## 2019-09-26 DIAGNOSIS — M47816 Spondylosis without myelopathy or radiculopathy, lumbar region: Secondary | ICD-10-CM | POA: Diagnosis not present

## 2019-10-06 DIAGNOSIS — M6281 Muscle weakness (generalized): Secondary | ICD-10-CM | POA: Diagnosis not present

## 2019-10-06 DIAGNOSIS — R201 Hypoesthesia of skin: Secondary | ICD-10-CM | POA: Diagnosis not present

## 2019-10-06 DIAGNOSIS — G90522 Complex regional pain syndrome I of left lower limb: Secondary | ICD-10-CM | POA: Diagnosis not present

## 2019-10-06 DIAGNOSIS — R262 Difficulty in walking, not elsewhere classified: Secondary | ICD-10-CM | POA: Diagnosis not present

## 2019-10-08 DIAGNOSIS — M47819 Spondylosis without myelopathy or radiculopathy, site unspecified: Secondary | ICD-10-CM | POA: Diagnosis not present

## 2019-10-14 DIAGNOSIS — J0100 Acute maxillary sinusitis, unspecified: Secondary | ICD-10-CM | POA: Diagnosis not present

## 2019-10-20 DIAGNOSIS — M6281 Muscle weakness (generalized): Secondary | ICD-10-CM | POA: Diagnosis not present

## 2019-10-20 DIAGNOSIS — G90522 Complex regional pain syndrome I of left lower limb: Secondary | ICD-10-CM | POA: Diagnosis not present

## 2019-10-20 DIAGNOSIS — R201 Hypoesthesia of skin: Secondary | ICD-10-CM | POA: Diagnosis not present

## 2019-10-20 DIAGNOSIS — R262 Difficulty in walking, not elsewhere classified: Secondary | ICD-10-CM | POA: Diagnosis not present

## 2019-10-21 DIAGNOSIS — D485 Neoplasm of uncertain behavior of skin: Secondary | ICD-10-CM | POA: Diagnosis not present

## 2019-10-21 DIAGNOSIS — D0362 Melanoma in situ of left upper limb, including shoulder: Secondary | ICD-10-CM | POA: Diagnosis not present

## 2019-10-23 DIAGNOSIS — M549 Dorsalgia, unspecified: Secondary | ICD-10-CM | POA: Diagnosis not present

## 2019-10-23 DIAGNOSIS — Z79899 Other long term (current) drug therapy: Secondary | ICD-10-CM | POA: Diagnosis not present

## 2019-10-23 DIAGNOSIS — Z5181 Encounter for therapeutic drug level monitoring: Secondary | ICD-10-CM | POA: Diagnosis not present

## 2019-10-23 DIAGNOSIS — G894 Chronic pain syndrome: Secondary | ICD-10-CM | POA: Diagnosis not present

## 2019-10-23 DIAGNOSIS — G90522 Complex regional pain syndrome I of left lower limb: Secondary | ICD-10-CM | POA: Diagnosis not present

## 2019-10-23 DIAGNOSIS — R262 Difficulty in walking, not elsewhere classified: Secondary | ICD-10-CM | POA: Diagnosis not present

## 2019-10-23 DIAGNOSIS — B0229 Other postherpetic nervous system involvement: Secondary | ICD-10-CM | POA: Diagnosis not present

## 2019-10-23 DIAGNOSIS — M47816 Spondylosis without myelopathy or radiculopathy, lumbar region: Secondary | ICD-10-CM | POA: Diagnosis not present

## 2019-10-23 DIAGNOSIS — R201 Hypoesthesia of skin: Secondary | ICD-10-CM | POA: Diagnosis not present

## 2019-10-23 DIAGNOSIS — M6281 Muscle weakness (generalized): Secondary | ICD-10-CM | POA: Diagnosis not present

## 2019-11-05 DIAGNOSIS — D0362 Melanoma in situ of left upper limb, including shoulder: Secondary | ICD-10-CM | POA: Diagnosis not present

## 2019-12-18 DIAGNOSIS — M549 Dorsalgia, unspecified: Secondary | ICD-10-CM | POA: Diagnosis not present

## 2019-12-18 DIAGNOSIS — G894 Chronic pain syndrome: Secondary | ICD-10-CM | POA: Diagnosis not present

## 2019-12-18 DIAGNOSIS — F419 Anxiety disorder, unspecified: Secondary | ICD-10-CM | POA: Diagnosis not present

## 2019-12-18 DIAGNOSIS — G4700 Insomnia, unspecified: Secondary | ICD-10-CM | POA: Diagnosis not present

## 2020-01-20 IMAGING — MR MRI HEAD WITHOUT AND WITH CONTRAST
14 of 16 series · 40 of 48 positions shown · IV contrast (Gadavist)
Comparison: Head CT 01/03/2019.  Brain MRI 12/30/2013.

CLINICAL DATA: 43-year-old female with encephalopathy. Altered
mental status.

EXAM:
MRI HEAD WITHOUT AND WITH CONTRAST
TECHNIQUE: Multiplanar, multiecho pulse sequences of the brain and surrounding
structures were obtained without and with intravenous contrast.
CONTRAST:  9 milliliters Gadavist

[Series 5: DWI · axial · 3.0mm · 0.92mm/px · z∈[-65,+91]mm · 6 of 105 slices shown (1 of 4)]
[im 1/105]
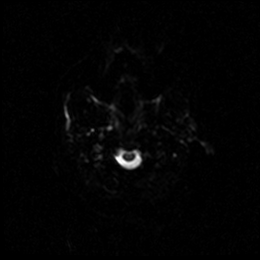
[im 21/105]
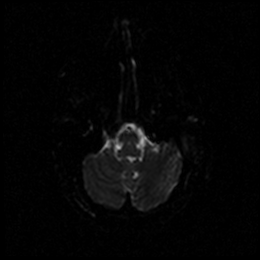
[im 42/105]
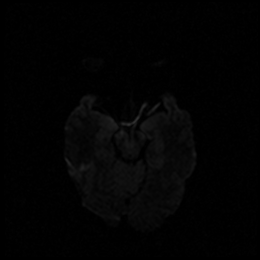
[im 63/105]
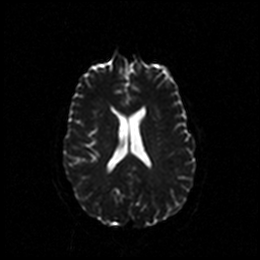
[im 84/105]
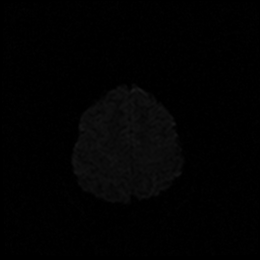
[im 105/105]
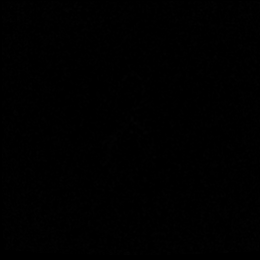

[Series 6: DWI · axial · 3.0mm · 0.92mm/px · z∈[-65,+91]mm · 2 of 53 slices shown (2 of 4)]
[im 1/53]
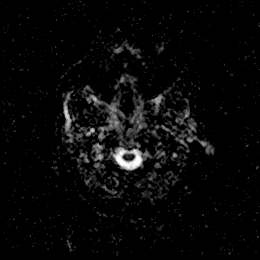
[im 53/53]
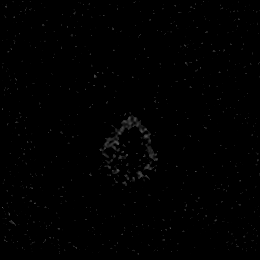

[Series 9: T1 · sagittal · 5.0mm · 0.75mm/px · 2 of 25 slices shown]
[im 1/25]
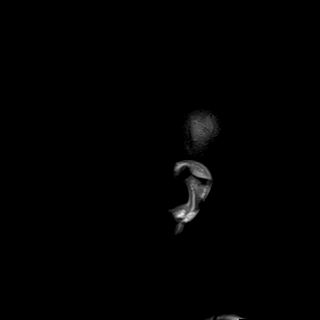
[im 25/25]
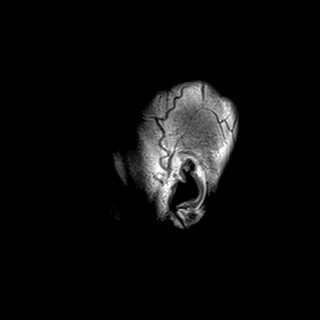

[Series 10: DWI · coronal · 4.0mm · 1.15mm/px · 5 of 74 slices shown (3 of 4)]
[im 1/74]
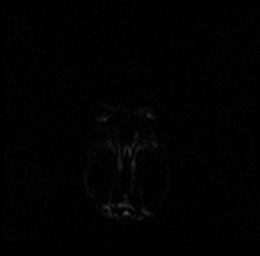
[im 19/74]
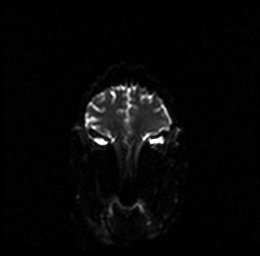
[im 37/74]
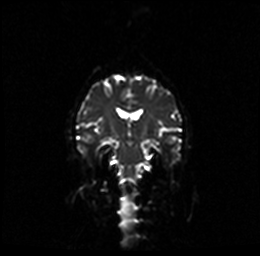
[im 55/74]
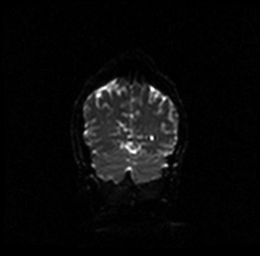
[im 74/74]
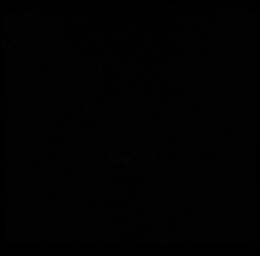

[Series 11: DWI · coronal · 4.0mm · 1.15mm/px · 2 of 37 slices shown (4 of 4)]
[im 1/37]
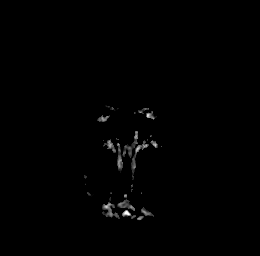
[im 37/37]
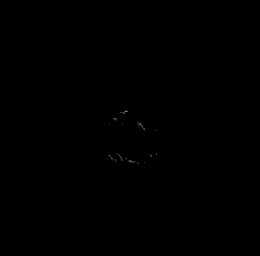

[Series 12: T2 · axial · 5.0mm · 0.72mm/px · z∈[-65,+97]mm · 2 of 26 slices shown (1 of 2)]
[im 1/26]
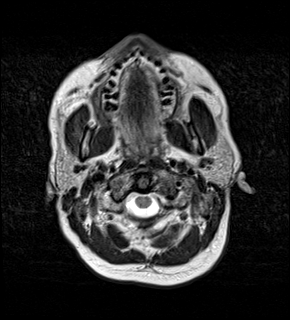
[im 26/26]
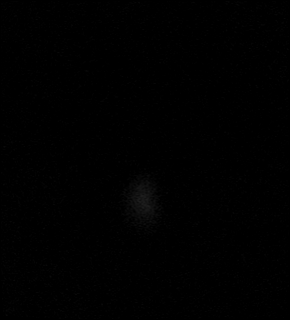

[Series 13: FLAIR · axial · 5.0mm · 0.72mm/px · z∈[-65,+97]mm · 2 of 26 slices shown]
[im 1/26]
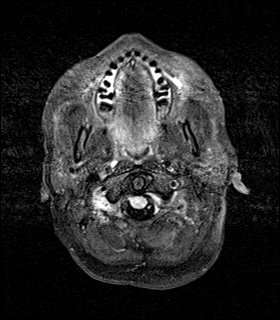
[im 26/26]
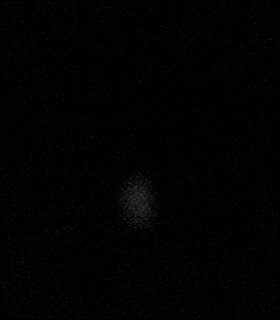

[Series 14: mag_images · axial · 3.0mm · 0.90mm/px · 1 of 16 slices shown]
[im 1/16]
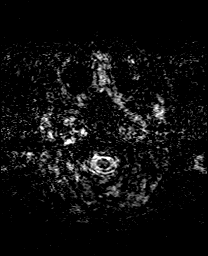

[Series 15: pha_images · axial · 3.0mm · 0.90mm/px · z∈[-73,+92]mm · 4 of 56 slices shown]
[im 1/56]
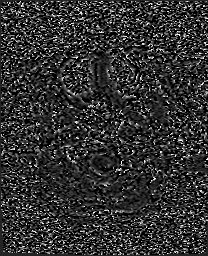
[im 19/56]
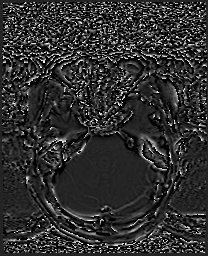
[im 37/56]
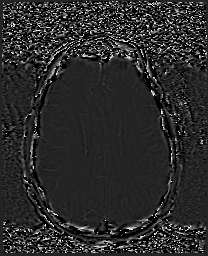
[im 56/56]
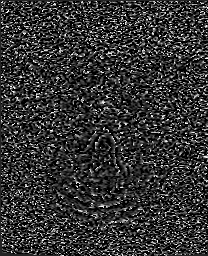

[Series 16: swi_images_nonorm · axial · 3.0mm · 0.90mm/px · z∈[-73,+104]mm · 4 of 59 slices shown]
[im 1/59]
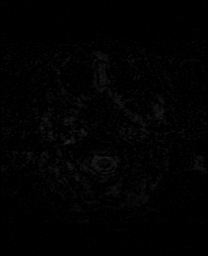
[im 20/59]
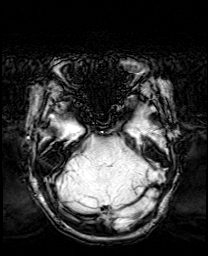
[im 39/59]
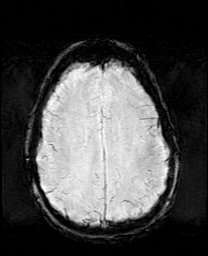
[im 59/59]
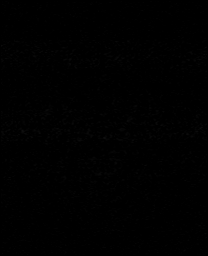

[Series 17: mag_images_nonorm · axial · 3.0mm · 0.90mm/px · z∈[-25,+104]mm · 3 of 44 slices shown]
[im 1/44]
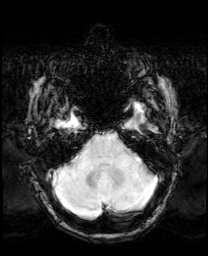
[im 22/44]
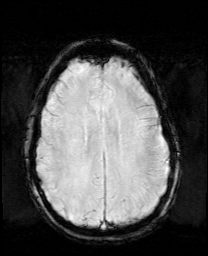
[im 44/44]
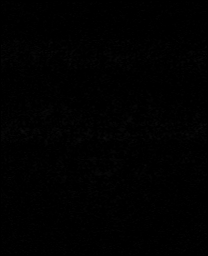

[Series 18: mip_images(sw) · axial · 24.0mm · 0.90mm/px · z∈[-62,+94]mm · 3 of 53 slices shown]
[im 1/53]
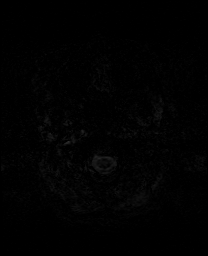
[im 27/53]
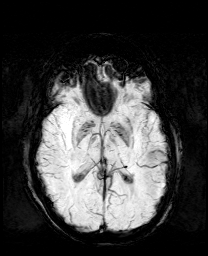
[im 53/53]
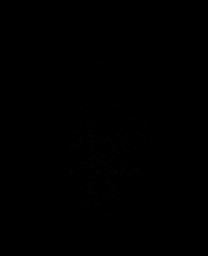

[Series 22: T2 · coronal · 5.0mm · 0.72mm/px · 2 of 32 slices shown (2 of 2)]
[im 1/32]
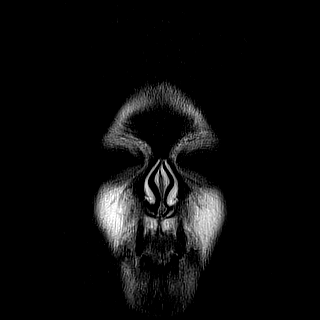
[im 32/32]
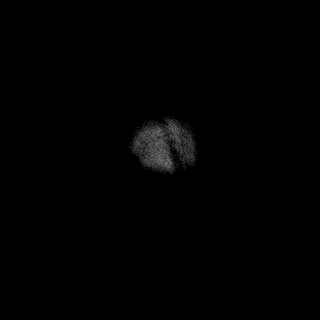

[Series 24: T1 post-contrast · coronal · 5.0mm · 0.34mm/px · 2 of 32 slices shown]
[im 1/32]
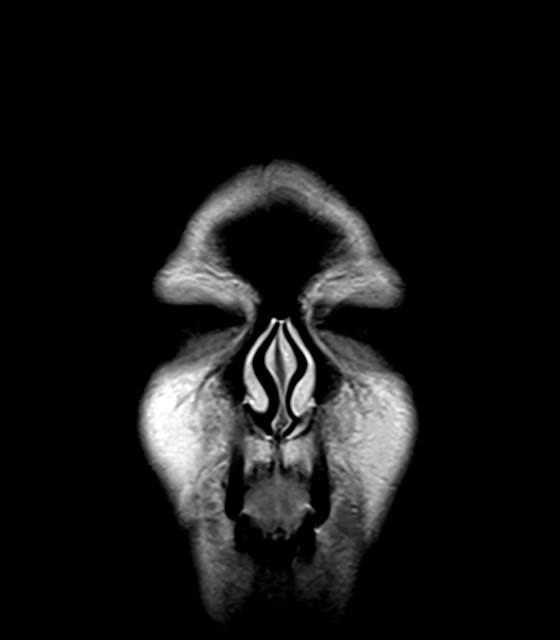
[im 32/32]
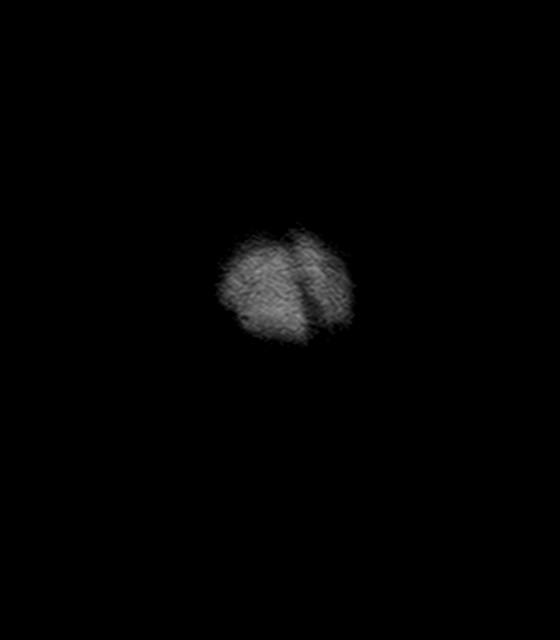

[40 of 48 positions shown; findings below may reference images not displayed]

FINDINGS: Brain: No restricted diffusion to suggest acute infarction. No
midline shift, mass effect, evidence of mass lesion,
ventriculomegaly, extra-axial collection or acute intracranial
hemorrhage. Cervicomedullary junction and pituitary are within
normal limits.

Gray and white matter signal appears to remain normal throughout the
brain as on the 3469 comparison. No chronic cerebral blood products
or encephalomalacia identified. No abnormal enhancement identified.
No dural thickening.

Vascular: Major intracranial vascular flow voids are stable since
3469. The major dural venous sinuses are enhancing and appear to be
patent.

Skull and upper cervical spine: Negative visible cervical spine.
Visualized bone marrow signal is within normal limits.

Sinuses/Orbits: Stable and negative.

Other: Mild bilateral mastoid effusions have substantially regressed
since 3469. Other visible internal auditory structures appear
normal. Scalp and face soft tissues appear negative.
IMPRESSION: 1. Stable since 3469 and normal MRI appearance of the brain.
2. Substantially regressed bilateral mastoid effusions, significance
doubtful.

## 2020-02-09 ENCOUNTER — Other Ambulatory Visit: Payer: BC Managed Care – PPO

## 2020-02-23 ENCOUNTER — Ambulatory Visit
Admission: RE | Admit: 2020-02-23 | Discharge: 2020-02-23 | Disposition: A | Discharge status: Home / Self Care | Payer: BC Managed Care – PPO | Source: Ambulatory Visit | Attending: Obstetrics | Admitting: Obstetrics

## 2020-02-23 ENCOUNTER — Other Ambulatory Visit: Payer: Self-pay

## 2020-02-23 DIAGNOSIS — N6489 Other specified disorders of breast: Secondary | ICD-10-CM

## 2022-04-23 LAB — COLOGUARD
COLOGUARD: NEGATIVE
COLOGUARD: NEGATIVE

## 2023-01-17 ENCOUNTER — Encounter: Payer: Self-pay | Admitting: Gastroenterology

## 2023-03-02 ENCOUNTER — Ambulatory Visit: Payer: BC Managed Care – PPO | Admitting: Gastroenterology

## 2023-03-02 ENCOUNTER — Encounter: Payer: Self-pay | Admitting: Gastroenterology

## 2023-03-02 VITALS — BP 110/80 | HR 84 | Ht 63.25 in | Wt 173.0 lb

## 2023-03-02 DIAGNOSIS — R1111 Vomiting without nausea: Secondary | ICD-10-CM | POA: Diagnosis not present

## 2023-03-02 DIAGNOSIS — R1084 Generalized abdominal pain: Secondary | ICD-10-CM

## 2023-03-02 DIAGNOSIS — R197 Diarrhea, unspecified: Secondary | ICD-10-CM | POA: Diagnosis not present

## 2023-03-02 NOTE — Progress Notes (Signed)
03/02/2023 Danielle Harrington [id number] 1976/09/07   HISTORY OF PRESENT ILLNESS: This is a 47 year old female who is new to our office.  She is here today with complaints of intermittent episodes of vomiting, diarrhea, and abdominal pain.  She says that every few weeks she will have an episode where she develops vomiting, diarrhea, and generalized abdominal pain.  She had a CT scan back in April 2023 that showed a small hiatal hernia and diverticulosis.  She tells me she had a colonoscopy probably 20 years ago and was found that endometriosis was causing her issues at that time.  Had a negative Cologuard last year.  She says that usually her symptoms will last a day or 2 and then resolve.  She says that in between these episodes she feels fine.  Has normal bowel movements between episodes.  She denies any significant heartburn or reflux.  She uses Zofran as needed and Imodium on occasion as needed as well.  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    At the time of her CT scan in April 2023, which was done for the same symptoms that she is here for today, CBC, CMP, lipase were all unremarkable.  Past Medical History:  Diagnosis Date   ADD (attention deficit disorder)    Anxiety    Arthritis    R knee, sciata treated - injection- 2012   Complication of anesthesia    used scop. patch in the past   Depression    Endometriosis    Family history of adverse reaction to anesthesia    N&V   Headache    migraine, last one 3-4 months ago    History of anemia    History of bronchitis    History of kidney stones    Hypertension    Insomnia    Ambien   PONV (postoperative nausea and vomiting)    Past Surgical History:  Procedure Laterality Date   APPENDECTOMY     CESAREAN SECTION     EXCISION MORTON'S NEUROMA Left 09/29/2016   Procedure: EXCISION MORTON'S NEUROMA LEFT FOOT 3RD WEB SPACE;   Surgeon: Nadara Mustard, MD;  Location: [facility] OR;  Service: Orthopedics;  Laterality: Left;   KNEE SURGERY Right 1995 & 2009   ACL repair & arthroscopy -2009   LAPAROSCOPIC ENDOMETRIOSIS FULGURATION     OPEN REDUCTION INTERNAL FIXATION (ORIF) FOOT LISFRANC FRACTURE Left 02/18/2016   Procedure: OPEN REDUCTION INTERNAL FIXATION (ORIF) FOOT LISFRANC FRACTURE;  Surgeon: Nadara Mustard, MD;  Location: [facility] OR;  Service: Orthopedics;  Laterality: Left;   SPINAL CORD STIMULATOR IMPLANT  2019   TONSILLECTOMY     ULNAR NERVE REPAIR Right    WRIST SURGERY Right    torn cartilage     reports that she quit smoking about 6 years ago. Her smoking use included cigarettes. She smoked an average of .5 packs per day. She has never used smokeless tobacco. She reports current alcohol use. She reports that she does not use drugs. family history includes Breast cancer in her maternal grandmother and sister; Esophageal cancer in her maternal grandfather; Heart failure in her father; Hypertension in her brother and brother; Lung cancer in her maternal uncle; Suicidality in her father. Allergies  Allergen Reactions   Other Nausea And Vomiting    general anesthesia   Adhesive [Tape] Itching and Rash  Please use "paper" tape      Outpatient Encounter Medications as of 03/02/2023  Medication Sig   clindamycin (CLINDAGEL) 1 % gel Apply 1 Application topically daily.   cyclobenzaprine (FLEXERIL) 5 MG tablet Take 5 mg by mouth 2 (two) times daily as needed.   DULoxetine (CYMBALTA) 60 MG capsule Take 120 mg by mouth at bedtime.   folic acid (FOLVITE) 1 MG tablet Take 1 mg by mouth at bedtime.    levocetirizine (XYZAL) 5 MG tablet Take 5 mg by mouth at bedtime.    lidocaine (LIDODERM) 5 % Place 1 patch onto the skin as needed.   methadone (DOLOPHINE) 5 MG tablet Take 5 mg by mouth 6 (six) times daily.   Misc. Devices MISC Take 1 tablet by mouth daily.   naloxone (NARCAN) nasal spray 4 mg/0.1 mL Place 1 spray into the nose  as needed.   nortriptyline (PAMELOR) 25 MG capsule Take 25 mg by mouth at bedtime.   ondansetron (ZOFRAN) 4 MG tablet Take 8 mg by mouth 3 (three) times daily as needed.   ondansetron (ZOFRAN-ODT) 4 MG disintegrating tablet Take 4 mg by mouth as needed.   topiramate (TOPAMAX) 50 MG tablet Take 50 mg by mouth 2 (two) times daily.   [DISCONTINUED] clonazePAM (KLONOPIN) 0.5 MG tablet Take 0.5 tablets (0.25 mg total) by mouth 2 (two) times daily.   [DISCONTINUED] cloNIDine (CATAPRES) 0.1 MG tablet Take 1 tablet (0.1 mg total) by mouth 2 (two) times daily.   [DISCONTINUED] gabapentin (NEURONTIN) 300 MG capsule Take 1 capsule (300 mg total) by mouth 3 (three) times daily.   [DISCONTINUED] losartan (COZAAR) 100 MG tablet Take 100 mg by mouth daily.   [DISCONTINUED] nystatin-triamcinolone ointment (MYCOLOG) Apply 1 application topically 2 (two) times daily.    [DISCONTINUED] vitamin C (ASCORBIC ACID) 500 MG tablet Take 1,000 mg by mouth every evening.   No facility-administered encounter medications on file as of 03/02/2023.     REVIEW OF SYSTEMS  : All other systems reviewed and negative except where noted in the History of Present Illness.   PHYSICAL EXAM: BP 110/80 (BP Location: Left Arm, Patient Position: Sitting, Cuff Size: Normal)   Pulse 84   Ht 5' 3.25" (1.607 m) Comment: height measured without shoes  Wt 173 lb (78.5 kg)   BMI 30.40 kg/m  General: Well developed white female in no acute distress Head: Normocephalic and atraumatic Eyes:  Sclerae anicteric, conjunctiva pink. Ears: Normal auditory acuity Lungs: Clear throughout to auscultation; no W/R/R. Heart: Regular rate and rhythm; no M/R/G. Abdomen: Soft, non-distended.  BS present.  Non-tender. Rectal:  Will be done at the time of colonoscopy. Musculoskeletal: Symmetrical with no gross deformities  Skin: No lesions on visible extremities Extremities: No edema  Neurological: Alert oriented x 4, grossly non-focal Psychological:   Alert and cooperative. Normal mood and affect  ASSESSMENT AND PLAN: *47 year old female with complaints of intermittent episodes of vomiting, diarrhea, generalized abdominal pain.  Says that this happens about few weeks or so and that within a day or 2 she recovers, no symptoms in between.  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  Last colonoscopy 20 years ago, had a negative Cologuard last year.  CT scan in 2023 showed diverticulosis and a small hiatal hernia.  Will plan for both EGD and colonoscopy to rule out other causes of her symptoms.  Otherwise we will treat symptomatically with  her Zofran and OTC Imodium for now.  Procedure is being scheduled with Dr. Tomasa Rand.  The risks, benefits, and alternatives to EGD and colonoscopy were discussed with the patient and she consents to proceed.   CC:  Deatra James, MD

## 2023-03-02 NOTE — Patient Instructions (Signed)
You have been scheduled for an endoscopy and colonoscopy. Please follow the written instructions given to you at your visit today. Please pick up your prep supplies at the pharmacy within the next 1-3 days. If you use inhalers (even only as needed), please bring them with you on the day of your procedure. _______________________________________________________  If your blood pressure at your visit was 140/90 or greater, please contact your primary care physician to follow up on this.  _______________________________________________________  If you are age 29 or older, your body mass index should be between 23-30. Your Body mass index is 30.4 kg/m. If this is out of the aforementioned range listed, please consider follow up with your Primary Care Provider.  If you are age 5 or younger, your body mass index should be between 19-25. Your Body mass index is 30.4 kg/m. If this is out of the aformentioned range listed, please consider follow up with your Primary Care Provider.   ________________________________________________________  The [facility] providers would like to encourage you to use [facility] to communicate with providers for non-urgent requests or questions.  Due to long hold times on the telephone, sending your provider a message by [facility] may be a faster and more efficient way to get a response.  Please allow 48 business hours for a response.  Please remember that this is for non-urgent requests.  _______________________________________________________

## 2023-03-05 NOTE — Progress Notes (Signed)
Agree with the assessment and plan as outlined by Doug Sou, PA-C.  Given intermittent symptoms without red flag symptoms, suspect symptoms are indeed related to stress.  Ok to proceed with EGD/Colonoscopy to definitively to rule out other etiologies such as PUD, IBD and mass lesion  Isreal Moline E. Tomasa Rand, MD  [facility]

## 2023-03-15 ENCOUNTER — Encounter: Payer: Self-pay | Admitting: Gastroenterology

## 2023-03-28 ENCOUNTER — Ambulatory Visit (AMBULATORY_SURGERY_CENTER): Payer: BC Managed Care – PPO | Admitting: Gastroenterology

## 2023-03-28 ENCOUNTER — Encounter: Payer: Self-pay | Admitting: Gastroenterology

## 2023-03-28 VITALS — BP 103/44 | HR 81 | Temp 98.6°F | Resp 15 | Ht 63.25 in | Wt 173.0 lb

## 2023-03-28 DIAGNOSIS — K2281 Esophageal polyp: Secondary | ICD-10-CM | POA: Diagnosis not present

## 2023-03-28 DIAGNOSIS — R1111 Vomiting without nausea: Secondary | ICD-10-CM

## 2023-03-28 DIAGNOSIS — R197 Diarrhea, unspecified: Secondary | ICD-10-CM | POA: Diagnosis present

## 2023-03-28 DIAGNOSIS — D130 Benign neoplasm of esophagus: Secondary | ICD-10-CM | POA: Diagnosis not present

## 2023-03-28 MED ORDER — SODIUM CHLORIDE 0.9 % IV SOLN
500.0000 mL | Freq: Once | INTRAVENOUS | Status: DC
Start: 2023-03-28 — End: 2023-03-28

## 2023-03-28 NOTE — Op Note (Addendum)
[facility] Patient Name: Danielle Harrington Procedure Date: 03/28/2023 3:24 PM MRN: [id number] Endoscopist: Lorin Picket E. Tomasa Rand , MD, [id number] Age: 47 Referring MD:  Date of Birth: October 19, 1976 Gender: Female Account #: [id number] Procedure:                Upper GI endoscopy Indications:              Generalized abdominal pain, Nausea with vomiting Medicines:                Monitored Anesthesia Care Procedure:                Pre-Anesthesia Assessment:                           - Prior to the procedure, a History and Physical                            was performed, and patient medications and                            allergies were reviewed. The patient's tolerance of                            previous anesthesia was also reviewed. The risks                            and benefits of the procedure and the sedation                            options and risks were discussed with the patient.                            All questions were answered, and informed consent                            was obtained. Prior Anticoagulants: The patient has                            taken no anticoagulant or antiplatelet agents. ASA                            Grade Assessment: II - A patient with mild systemic                            disease. After reviewing the risks and benefits,                            the patient was deemed in satisfactory condition to                            undergo the procedure.                           After obtaining informed consent, the endoscope was  passed under direct vision. Throughout the                            procedure, the patient's blood pressure, pulse, and                            oxygen saturations were monitored continuously. The                            Olympus Scope [id number] was introduced through the                            mouth, and advanced to the third part of duodenum.                             The upper GI endoscopy was accomplished without                            difficulty. The patient tolerated the procedure                            well. Scope In: Scope Out: Findings:                 The examined portions of the nasopharynx,                            oropharynx and larynx were normal.                           A single 3 mm frondlike polyp was found 21 cm from                            the incisors. The polyp was removed with a cold                            biopsy forceps. Resection and retrieval were                            complete. Estimated blood loss was minimal.                           The exam of the esophagus was otherwise normal.                           Patchy mildly erythematous mucosa was found in the                            prepyloric region of the stomach.                           The exam of the stomach was otherwise normal.                           The examined duodenum  was normal. Biopsies for                            histology were taken with a cold forceps for                            evaluation of celiac disease. Estimated blood loss                            was minimal. Complications:            No immediate complications. Estimated Blood Loss:     Estimated blood loss was minimal. Impression:               - The examined portions of the nasopharynx,                            oropharynx and larynx were normal.                           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 Resected and retrieved.                           - Erythematous mucosa in the prepyloric region of                            the stomach.                           - Normal examined duodenum. Biopsied.                           - No obvious endoscopic abnormalities to explain                            patient's symptoms. Recommendation:           - Patient has a contact number available for                             emergencies. The signs and symptoms of potential                            delayed complications were discussed with the                            patient. Return to normal activities tomorrow.                            Written discharge instructions were provided to the                            patient.                           -  Resume previous diet.                           - Continue present medications.                           - Await pathology results.                           - Further recommendations will be based on                            pathology results. Cagney Steenson E. Tomasa Rand, MD 03/28/2023 4:11:21 PM This report has been signed electronically.

## 2023-03-28 NOTE — Progress Notes (Signed)
Called to room to assist during endoscopic procedure.  Patient ID and intended procedure confirmed with present staff. Received instructions for my participation in the procedure from the performing physician.  

## 2023-03-28 NOTE — Progress Notes (Signed)
Uneventful anesthetic. Report to pacu rn. Vss. Care resumed by rn. 

## 2023-03-28 NOTE — Patient Instructions (Signed)
Thank you for coming in to see [place] today!  Resume your regular diet and medications today. Return to regular daily activities tomorrow. Recommend next screening colonoscopy in 10 years.   YOU HAD AN ENDOSCOPIC PROCEDURE TODAY AT THE [facility]:   Refer to the procedure report that was given to you for any specific questions about what was found during the examination.  If the procedure report does not answer your questions, please call your gastroenterologist to clarify.  If you requested that your care partner not be given the details of your procedure findings, then the procedure report has been included in a sealed envelope for you to review at your convenience later.  YOU SHOULD EXPECT: Some feelings of bloating in the abdomen. Passage of more gas than usual.  Walking can help get rid of the air that was put into your GI tract during the procedure and reduce the bloating. If you had a lower endoscopy (such as a colonoscopy or flexible sigmoidoscopy) you may notice spotting of blood in your stool or on the toilet paper. If you underwent a bowel prep for your procedure, you may not have a normal bowel movement for a few days.  Please Note:  You might notice some irritation and congestion in your nose or some drainage.  This is from the oxygen used during your procedure.  There is no need for concern and it should clear up in a day or so.  SYMPTOMS TO REPORT IMMEDIATELY:  Following lower endoscopy (colonoscopy or flexible sigmoidoscopy):  Excessive amounts of blood in the stool  Significant tenderness or worsening of abdominal pains  Swelling of the abdomen that is new, acute  Fever of 100F or higher  Following upper endoscopy (EGD)  Vomiting of blood or coffee ground material  New chest pain or pain under the shoulder blades  Painful or persistently difficult swallowing  New shortness of breath  Fever of 100F or higher  Black, tarry-looking stools  For urgent or emergent  issues, a gastroenterologist can be reached at any hour by calling [phone]. Do not use MyChart messaging for urgent concerns.    DIET:  We do recommend a small meal at first, but then you may proceed to your regular diet.  Drink plenty of fluids but you should avoid alcoholic beverages for 24 hours.  ACTIVITY:  You should plan to take it easy for the rest of today and you should NOT DRIVE or use heavy machinery until tomorrow (because of the sedation medicines used during the test).    FOLLOW UP: Our staff will call the number listed on your records the next business day following your procedure.  We will call around 7:15- 8:00 am to check on you and address any questions or concerns that you may have regarding the information given to you following your procedure. If we do not reach you, we will leave a message.     If any biopsies were taken you will be contacted by phone or by letter within the next 1-3 weeks.  Please call us at [phone] if you have not heard about the biopsies in 3 weeks.    SIGNATURES/CONFIDENTIALITY: You and/or your care partner have signed paperwork which will be entered into your electronic medical record.  These signatures attest to the fact that that the information above on your After Visit Summary has been reviewed and is understood.  Full responsibility of the confidentiality of this discharge information lies with you and/or your  care-partner.

## 2023-03-28 NOTE — Progress Notes (Signed)
History and Physical Interval Note:  03/28/2023 3:21 PM  Danielle Harrington  has presented today for endoscopic procedure(s), with the diagnosis of  Encounter Diagnoses  Name Primary?   Vomiting without nausea, unspecified vomiting type Yes   Diarrhea, unspecified type   .  The various methods of evaluation and treatment have been discussed with the patient and/or family. After consideration of risks, benefits and other options for treatment, the patient has consented to  the endoscopic procedure(s).   The patient's history has been reviewed, patient examined, no change in status, stable for endoscopic procedure(s).  I have reviewed the patient's chart and labs.  Questions were answered to the patient's satisfaction.     Carold Eisner E. Tomasa Rand, MD [facility]

## 2023-03-28 NOTE — Op Note (Signed)
[facility] Patient Name: Danielle Harrington Procedure Date: 03/28/2023 3:24 PM MRN: [id number] Endoscopist: Lorin Picket E. Tomasa Rand , MD, [id number] Age: 47 Referring MD:  Date of Birth: 01/21/1976 Gender: Female Account #: [id number] Procedure:                Colonoscopy Indications:              Generalized abdominal pain, Chronic diarrhea Medicines:                Monitored Anesthesia Care Procedure:                Pre-Anesthesia Assessment:                           - Prior to the procedure, a History and Physical                            was performed, and patient medications and                            allergies were reviewed. The patient's tolerance of                            previous anesthesia was also reviewed. The risks                            and benefits of the procedure and the sedation                            options and risks were discussed with the patient.                            All questions were answered, and informed consent                            was obtained. Prior Anticoagulants: The patient has                            taken no anticoagulant or antiplatelet agents. ASA                            Grade Assessment: II - A patient with mild systemic                            disease. After reviewing the risks and benefits,                            the patient was deemed in satisfactory condition to                            undergo the procedure.                           After obtaining informed consent, the colonoscope  was passed under direct vision. Throughout the                            procedure, the patient's blood pressure, pulse, and                            oxygen saturations were monitored continuously. The                            Olympus CF-HQ190L [id number]) Colonoscope was                            introduced through the anus and advanced to the the                            terminal  ileum, with identification of the                            appendiceal orifice and IC valve. The colonoscopy                            was performed without difficulty. The patient                            tolerated the procedure well. The quality of the                            bowel preparation was adequate. The terminal ileum,                            ileocecal valve, appendiceal orifice, and rectum                            were photographed. The bowel preparation used was                            Miralax via split dose instruction. Scope In: 3:45:47 PM Scope Out: 4:02:37 PM Scope Withdrawal Time: 0 hours 10 minutes 3 seconds  Total Procedure Duration: 0 hours 16 minutes 50 seconds  Findings:                 The perianal and digital rectal examinations were                            normal. Pertinent negatives include normal                            sphincter tone and no palpable rectal lesions.                           The colon (entire examined portion) appeared                            normal. Biopsies for histology were taken with a  cold forceps from the ascending colon, transverse                            colon, descending colon and sigmoid colon for                            evaluation of microscopic colitis. Estimated blood                            loss was minimal.                           The terminal ileum appeared normal.                           The retroflexed view of the distal rectum and anal                            verge was normal and showed no anal or rectal                            abnormalities. Complications:            No immediate complications. Estimated Blood Loss:     Estimated blood loss was minimal. Impression:               - The entire examined colon is normal. Biopsied.                           - The examined portion of the ileum was normal.                           - The distal rectum and anal  verge are normal on                            retroflexion view. Recommendation:           - Patient has a contact number available for                            emergencies. The signs and symptoms of potential                            delayed complications were discussed with the                            patient. Return to normal activities tomorrow.                            Written discharge instructions were provided to the                            patient.                           - Resume previous diet.                           -  Continue present medications.                           - Await pathology results.                           - Repeat colonoscopy in 10 years for screening                            purposes. Celeste Candelas E. Tomasa Rand, MD 03/28/2023 4:28:43 PM This report has been signed electronically.

## 2023-03-29 ENCOUNTER — Telehealth: Payer: Self-pay

## 2023-03-29 NOTE — Telephone Encounter (Signed)
  Follow up Call-     03/28/2023    2:36 PM  Call back number  Post procedure Call Back phone  # [phone]  Permission to leave phone message Yes     Patient questions:  Do you have a fever, pain , or abdominal swelling? No. Pain Score  0 *  Have you tolerated food without any problems? Yes.    Have you been able to return to your normal activities? Yes.    Do you have any questions about your discharge instructions: Diet   No. Medications  No. Follow up visit  No.  Do you have questions or concerns about your Care? No.  Actions: * If pain score is 4 or above: No action needed, pain <4.

## 2023-04-04 ENCOUNTER — Other Ambulatory Visit: Payer: Self-pay

## 2023-04-04 DIAGNOSIS — R197 Diarrhea, unspecified: Secondary | ICD-10-CM

## 2023-04-04 DIAGNOSIS — R1084 Generalized abdominal pain: Secondary | ICD-10-CM

## 2023-04-04 DIAGNOSIS — R1111 Vomiting without nausea: Secondary | ICD-10-CM

## 2023-04-04 NOTE — Progress Notes (Signed)
Yeni,  The biopsies of your duodenum, stomach and colon were all normal.  There was no evidence of celiac disease, H. Pylori infection or microscopic colitis.  I would recommend we check an ultrasound to rule out gallstones, and also rule out a condition called alpha gal syndrome which can cause bouts of abdominal pain, nausea, vomiting and diarrhea.  Testing for alpha gal involves a simple blood test which can be done in the lab in our building.  You will be contacted to schedule the ultrasound.   Bonita Quin,  Can you please order a RUQUS and alpha gal panel?

## 2023-08-24 ENCOUNTER — Other Ambulatory Visit: Payer: Self-pay

## 2023-08-24 ENCOUNTER — Emergency Department
Admission: EM | Admit: 2023-08-24 | Discharge: 2023-08-24 | Disposition: A | Discharge status: Home / Self Care | Payer: BC Managed Care – PPO | Attending: Emergency Medicine | Admitting: Emergency Medicine

## 2023-08-24 ENCOUNTER — Emergency Department: Payer: BC Managed Care – PPO

## 2023-08-24 DIAGNOSIS — R531 Weakness: Secondary | ICD-10-CM | POA: Diagnosis not present

## 2023-08-24 DIAGNOSIS — R519 Headache, unspecified: Secondary | ICD-10-CM | POA: Insufficient documentation

## 2023-08-24 DIAGNOSIS — R42 Dizziness and giddiness: Secondary | ICD-10-CM | POA: Insufficient documentation

## 2023-08-24 DIAGNOSIS — I10 Essential (primary) hypertension: Secondary | ICD-10-CM | POA: Insufficient documentation

## 2023-08-24 LAB — COMPREHENSIVE METABOLIC PANEL
ALT: 29 U/L (ref 0–44)
AST: 23 U/L (ref 15–41)
Albumin: 3.9 g/dL (ref 3.5–5.0)
Alkaline Phosphatase: 63 U/L (ref 38–126)
Anion gap: 9 (ref 5–15)
BUN: 9 mg/dL (ref 6–20)
CO2: 22 mmol/L (ref 22–32)
Calcium: 8.6 mg/dL — ABNORMAL LOW (ref 8.9–10.3)
Chloride: 102 mmol/L (ref 98–111)
Creatinine, Ser: 0.56 mg/dL (ref 0.44–1.00)
GFR, Estimated: >60 mL/min (ref >60)
Glucose, Bld: 94 mg/dL (ref 70–99)
Potassium: 3.3 mmol/L — ABNORMAL LOW (ref 3.5–5.1)
Sodium: 133 mmol/L — ABNORMAL LOW (ref 135–145)
Total Bilirubin: 0.4 mg/dL (ref 0.3–1.2)
Total Protein: 7 g/dL (ref 6.5–8.1)

## 2023-08-24 LAB — CBC
HCT: 39.8 % (ref 36.0–46.0)
Hemoglobin: 13.4 g/dL (ref 12.0–15.0)
MCH: 31.8 pg (ref 26.0–34.0)
MCHC: 33.7 g/dL (ref 30.0–36.0)
MCV: 94.3 fL (ref 80.0–100.0)
Platelets: 272 10*3/uL (ref 150–400)
RBC: 4.22 MIL/uL (ref 3.87–5.11)
RDW: 12.1 % (ref 11.5–15.5)
WBC: 8 10*3/uL (ref 4.0–10.5)
nRBC: 0 % (ref 0.0–0.2)

## 2023-08-24 LAB — PREGNANCY, URINE: Preg Test, Ur: NEGATIVE

## 2023-08-24 MED ORDER — POTASSIUM CHLORIDE CRYS ER 20 MEQ PO TBCR
40.0000 meq | EXTENDED_RELEASE_TABLET | Freq: Once | ORAL | Status: AC
  Administered 2023-08-24: 40 meq via ORAL
  Filled 2023-08-24: qty 2

## 2023-08-24 NOTE — ED Provider Notes (Signed)
[facility] Provider Note    Event Date/Time   First MD Initiated Contact with Patient 08/24/23 1337     (approximate)   History   Chief Complaint Weakness   HPI  Danielle Harrington is a 47 y.o. female with past medical history of hypertension, anemia, depression, and complex regional pain syndrome who presents to the ED complaining of weakness.  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  Headache came on around 930 this morning and she states she felt like she was going to pass out as the pain worsened.  She denies any vision changes, speech changes, numbness, or weakness with the symptoms.  She did not have any chest pain or shortness of breath at the time.  Since arriving to the ED, she now states that she feels better with some mild ongoing dizziness but minimal headache.  She does report feeling sick earlier this week with fatigue, malaise, cough, congestion, and nausea.  The symptoms all improved before she returned to work today.     Physical Exam   Triage Vital Signs: ED Triage Vitals [08/24/23 1153]  Encounter Vitals Group     BP (!) 143/97     Systolic BP Percentile      Diastolic BP Percentile      Pulse Rate 88     Resp 17     Temp 98.9 F (37.2 C)     Temp Source Oral     SpO2 98 %     Weight 173 lb 1 oz (78.5 kg)     Height 5' 3.25" (1.607 m)     Head Circumference      Peak Flow      Pain Score 7     Pain Loc      Pain Education      Exclude from Growth Chart     Most recent vital signs: Vitals:   08/24/23 1153  BP: (!) 143/97  Pulse: 88  Resp: 17  Temp: 98.9 F (37.2 C)  SpO2: 98%    Constitutional: Alert and oriented. Eyes: Conjunctivae are normal. Head: Atraumatic. Nose: No congestion/rhinnorhea. Mouth/Throat: Mucous membranes are moist.  Neck: Supple with no meningismus. Cardiovascular: Normal rate, regular  rhythm. Grossly normal heart sounds.  2+ radial pulses bilaterally. Respiratory: Normal respiratory effort.  No retractions. Lungs CTAB. Gastrointestinal: Soft and nontender. No distention. Musculoskeletal: No lower extremity tenderness nor edema.  Neurologic:  Normal speech and language. No gross focal neurologic deficits are appreciated.    ED Results / Procedures / Treatments   Labs (all labs ordered are listed, but only abnormal results are displayed) Labs Reviewed  COMPREHENSIVE METABOLIC PANEL - Abnormal; Notable for the following components:      Result Value   Sodium 133 (*)    Potassium 3.3 (*)    Calcium 8.6 (*)    All other components within normal limits  CBC  PREGNANCY, URINE  CBG MONITORING, ED  POC URINE PREG, ED    RADIOLOGY CT head reviewed and interpreted by me with no hemorrhage or midline shift.  PROCEDURES:  Critical Care performed: No  Procedures   MEDICATIONS ORDERED IN ED: Medications  potassium chloride SA (KLOR-CON M) CR tablet 40 mEq (40 mEq Oral Given 08/24/23 1436)     IMPRESSION / MDM / ASSESSMENT AND PLAN / ED COURSE  I reviewed the triage vital signs and the nursing  notes.                              47 y.o. female with past medical history of hypertension, anemia, depression, and complex regional pain syndrome who presents to the ED following episode of headache with dizziness and lightheadedness.  Patient's presentation is most consistent with acute presentation with potential threat to life or bodily function.  Differential diagnosis includes, but is not limited to, stroke, TIA, SAH, meningitis, anemia, electrolyte abnormality, AKI, vasovagal episode.  Patient well-appearing and in no acute distress, vital signs are unremarkable.  EKG was attempted but unable to be obtained due to significant artifact from patient's spinal cord stimulator.  Low suspicion for cardiac etiology for the episode and labs are reassuring with no  significant anemia, leukocytosis, tract abnormality, or AKI.  Patient has a nonfocal neurologic exam and CT head is negative for acute process, I doubt SAH and no findings concerning for meningitis.  She may have some dehydration and ongoing malaise from recent viral illness.  Pregnancy testing is negative and she is appropriate for outpatient management, was counseled to drink plenty of fluids and to follow-up with her PCP, otherwise return to the ED for new or worsening symptoms.  Patient agrees with plan.      FINAL CLINICAL IMPRESSION(S) / ED DIAGNOSES   Final diagnoses:  Acute nonintractable headache, unspecified headache type  Lightheadedness     Rx / DC Orders   ED Discharge Orders     None        Note:  This document was prepared using Dragon voice recognition software and may include unintentional dictation errors.   Chesley Noon, MD 08/24/23 [phone]

## 2023-08-24 NOTE — ED Notes (Signed)
First nure note: Pt here via [facility] from [employer] (work). Pt c/o of weakness.   137/90 100% RA HR: 95 98.7 CBG 130

## 2023-08-24 NOTE — ED Notes (Signed)
Pt has a spinal cord stimulator. Unable to obtain EKG at this time.

## 2023-08-24 NOTE — ED Notes (Signed)
Called lab to add on the urine pregnancy test.

## 2023-08-24 NOTE — ED Triage Notes (Signed)
Pt here with weakness since this morning. Pt states she got really hot and dizzy and has some AMS and did not feel like herself. Pt denies pain. Pt denies NVD but states she does not feel like herself. Pt c/o a headache.

## 2024-03-12 ENCOUNTER — Emergency Department (HOSPITAL_COMMUNITY)

## 2024-03-12 ENCOUNTER — Emergency Department (HOSPITAL_COMMUNITY)
Admission: EM | Admit: 2024-03-12 | Discharge: 2024-03-12 | Disposition: A | Discharge status: Home / Self Care | Attending: Emergency Medicine | Admitting: Emergency Medicine

## 2024-03-12 ENCOUNTER — Encounter (HOSPITAL_COMMUNITY): Payer: Self-pay

## 2024-03-12 ENCOUNTER — Other Ambulatory Visit: Payer: Self-pay

## 2024-03-12 DIAGNOSIS — R200 Anesthesia of skin: Secondary | ICD-10-CM

## 2024-03-12 DIAGNOSIS — R202 Paresthesia of skin: Secondary | ICD-10-CM | POA: Insufficient documentation

## 2024-03-12 LAB — CBC WITH DIFFERENTIAL/PLATELET
Abs Immature Granulocytes: 0.02 10*3/uL (ref 0.00–0.07)
Basophils Absolute: 0.1 10*3/uL (ref 0.0–0.1)
Basophils Relative: 1 %
Eosinophils Absolute: 0.1 10*3/uL (ref 0.0–0.5)
Eosinophils Relative: 3 %
HCT: 40.8 % (ref 36.0–46.0)
Hemoglobin: 13.4 g/dL (ref 12.0–15.0)
Immature Granulocytes: 0 %
Lymphocytes Relative: 39 %
Lymphs Abs: 2.1 10*3/uL (ref 0.7–4.0)
MCH: 31.2 pg (ref 26.0–34.0)
MCHC: 32.8 g/dL (ref 30.0–36.0)
MCV: 94.9 fL (ref 80.0–100.0)
Monocytes Absolute: 0.3 10*3/uL (ref 0.1–1.0)
Monocytes Relative: 6 %
Neutro Abs: 2.9 10*3/uL (ref 1.7–7.7)
Neutrophils Relative %: 51 %
Platelets: 239 10*3/uL (ref 150–400)
RBC: 4.3 MIL/uL (ref 3.87–5.11)
RDW: 12.1 % (ref 11.5–15.5)
WBC: 5.6 10*3/uL (ref 4.0–10.5)
nRBC: 0 % (ref 0.0–0.2)

## 2024-03-12 LAB — BASIC METABOLIC PANEL WITH GFR
Anion gap: 6 (ref 5–15)
BUN: 8 mg/dL (ref 6–20)
CO2: 22 mmol/L (ref 22–32)
Calcium: 9.2 mg/dL (ref 8.9–10.3)
Chloride: 108 mmol/L (ref 98–111)
Creatinine, Ser: 0.47 mg/dL (ref 0.44–1.00)
GFR, Estimated: >60 mL/min (ref >60)
Glucose, Bld: 164 mg/dL — ABNORMAL HIGH (ref 70–99)
Potassium: 3.6 mmol/L (ref 3.5–5.1)
Sodium: 136 mmol/L (ref 135–145)

## 2024-03-12 NOTE — ED Provider Triage Note (Signed)
 Emergency Medicine Provider Triage Evaluation Note  Danielle Harrington , a 48 y.o. female  was evaluated in triage.  Pt complains of left foot numbness. Numbness to bottom of left foot that started yesterday with pain to back of calf.  Review of Systems  Positive: Left foot numbness and right calf Negative: Saddle anesthesia  Physical Exam  BP 122/69 (BP Location: Left Arm)   Pulse 95   Temp 98.9 F (37.2 C) (Oral)   Resp 18   Ht 5\' 4"  (1.626 m)   Wt 70.3 kg   SpO2 98%   BMI 26.61 kg/m  Gen:   Awake, no distress   Resp:  Normal effort  MSK:   Moves extremities without difficulty  Other:    Medical Decision Making  Medically screening exam initiated at 6:59 PM.  Appropriate orders placed.  JOANMARIE TSANG was informed that the remainder of the evaluation will be completed by another provider, this initial triage assessment does not replace that evaluation, and the importance of remaining in the ED until their evaluation is complete.   Sonnie Dusky, PA-C 03/12/24 1901

## 2024-03-12 NOTE — ED Provider Notes (Addendum)
 [place] EMERGENCY DEPARTMENT AT [facility] Provider Note   CSN: [id number] Arrival date & time: 03/12/24  1748     History  Chief Complaint  Patient presents with   foot numbness    Danielle Harrington is a 48 y.o. female.  48 year old female presents with paresthesias to her left calf and left foot.  Has a history of complex regional pain syndrome which is being treated with methadone .  She also has a spinal stimulator in due to chronic back pain.  States that her symptoms began last night when she was going to the bathroom in the middle of night.  Patient felt that maybe she stepped wrong.  Had trouble with walking.  She states normally that she has exquisite pain but now she has paresthesias.  Has chronic back pain and that her current back pain is unchanged.  No bowel or bladder dysfunction.  States that when she tries to walk is very difficult to push off on her left foot.  No treatment use for this prior to arrival       Home Medications Prior to Admission medications   Medication Sig Start Date End Date Taking? Authorizing Provider  cyclobenzaprine (FLEXERIL) 5 MG tablet Take 5 mg by mouth 2 (two) times daily as needed.    [provider]  DULoxetine (CYMBALTA) 60 MG capsule Take 120 mg by mouth at bedtime. 01/30/22   [provider]  folic acid (FOLVITE) 1 MG tablet Take 1 mg by mouth at bedtime.  06/08/16   [provider]  levocetirizine (XYZAL) 5 MG tablet Take 5 mg by mouth at bedtime.  01/08/15   [provider]  lidocaine  (LIDODERM ) 5 % Place 1 patch onto the skin as needed.    [provider]  methadone  (DOLOPHINE ) 5 MG tablet Take 5 mg by mouth 6 (six) times daily.    [provider]  Misc. Devices MISC Take 1 tablet by mouth daily. 11/07/21   [provider]  naloxone  (NARCAN ) nasal spray 4 mg/0.1 mL Place 1 spray into the nose as needed. 11/02/21   [provider]  nortriptyline  (PAMELOR) 25 MG capsule Take 25 mg by mouth at bedtime. 01/08/23   [provider]  ondansetron  (ZOFRAN ) 4 MG tablet Take 8 mg by mouth 3 (three) times daily as needed. 10/10/22   [provider]  ondansetron  (ZOFRAN -ODT) 4 MG disintegrating tablet Take 4 mg by mouth as needed. 11/13/22   [provider]  topiramate (TOPAMAX) 50 MG tablet Take 50 mg by mouth 2 (two) times daily. 01/08/23   [provider]      Allergies    Other and Adhesive [tape]    Review of Systems   Review of Systems  All other systems reviewed and are negative.   Physical Exam Updated Vital Signs BP 122/69 (BP Location: Left Arm)   Pulse 95   Temp 98.9 F (37.2 C) (Oral)   Resp 18   Ht 1.626 m (5\' 4" )   Wt 70.3 kg   SpO2 98%   BMI 26.61 kg/m  Physical Exam Vitals and nursing note reviewed.  Constitutional:      General: She is not in acute distress.    Appearance: Normal appearance. She is well-developed. She is not toxic-appearing.  HENT:     Head: Normocephalic and atraumatic.  Eyes:     General: Lids are normal.     Conjunctiva/sclera: Conjunctivae normal.     Pupils: Pupils  are equal, round, and reactive to light.  Neck:     Thyroid : No thyroid  mass.     Trachea: No tracheal deviation.  Cardiovascular:     Rate and Rhythm: Normal rate and regular rhythm.     Heart sounds: Normal heart sounds. No murmur heard.    No gallop.  Pulmonary:     Effort: Pulmonary effort is normal. No respiratory distress.     Breath sounds: Normal breath sounds. No stridor. No decreased breath sounds, wheezing, rhonchi or rales.  Abdominal:     General: There is no distension.     Palpations: Abdomen is soft.     Tenderness: There is no abdominal tenderness. There is no rebound.  Musculoskeletal:        General: No tenderness. Normal range of motion.     Cervical back: Normal range of motion and neck supple.     Comments: Patient has sensation to the dorsal surface of her left  foot as well as the sole of her foot.  Patient was able to ambulate in department and push off on her left foot but with some difficulty.  Skin:    General: Skin is warm and dry.     Findings: No abrasion or rash.  Neurological:     Mental Status: She is alert and oriented to person, place, and time. Mental status is at baseline.     GCS: GCS eye subscore is 4. GCS verbal subscore is 5. GCS motor subscore is 6.     Cranial Nerves: No cranial nerve deficit.     Sensory: No sensory deficit.     Motor: Motor function is intact.  Psychiatric:        Attention and Perception: Attention normal.        Speech: Speech normal.        Behavior: Behavior normal.     ED Results / Procedures / Treatments   Labs (all labs ordered are listed, but only abnormal results are displayed) Labs Reviewed  BASIC METABOLIC PANEL WITH GFR - Abnormal; Notable for the following components:      Result Value   Glucose, Bld 164 (*)    All other components within normal limits  CBC WITH DIFFERENTIAL/PLATELET  POC URINE PREG, ED    EKG None  Radiology No results found.  Procedures Procedures    Medications Ordered in ED Medications - No data to display  ED Course/ Medical Decision Making/ A&P                                 Medical Decision Making  Patient have CT lumbar spine to look for any signs of nerve impingement.  If this is negative patient will go  11:28 PM CT lumbar spine showed no acute worsening of disease.  Patient cannot have MRI due to an issue with her spinal stimulator.  Patient has no foot drop with walking.  Will discharge home        Final Clinical Impression(s) / ED Diagnoses Final diagnoses:  None    Rx / DC Orders ED Discharge Orders     None         Lind Repine, MD 03/12/24 2159    Lind Repine, MD 03/12/24 2328    Lind Repine, MD 03/12/24 2328

## 2024-03-12 NOTE — Discharge Instructions (Signed)
 Follow-up with your doctor next week.  Return here if you cannot move your leg at all

## 2024-03-12 NOTE — ED Notes (Signed)
 Took pt to bathroom on a "steady"

## 2024-04-21 ENCOUNTER — Other Ambulatory Visit: Payer: Self-pay | Admitting: Physician Assistant

## 2024-04-21 DIAGNOSIS — Z122 Encounter for screening for malignant neoplasm of respiratory organs: Secondary | ICD-10-CM

## 2024-10-21 ENCOUNTER — Ambulatory Visit
Admission: RE | Admit: 2024-10-21 | Discharge: 2024-10-21 | Disposition: A | Discharge status: Home / Self Care | Source: Ambulatory Visit

## 2024-10-21 ENCOUNTER — Other Ambulatory Visit: Payer: Self-pay

## 2024-10-21 DIAGNOSIS — R0989 Other specified symptoms and signs involving the circulatory and respiratory systems: Secondary | ICD-10-CM
